# Patient Record
Sex: Male | Born: 1980 | Race: White | Hispanic: No | Marital: Single | Smoking: Never smoker
Health system: Southern US, Community
[De-identification: ages and names within clinical notes are randomized; demographics above are authoritative.]

## PROBLEM LIST (undated history)

## (undated) DIAGNOSIS — F32A Depression, unspecified: Secondary | ICD-10-CM

## (undated) DIAGNOSIS — F419 Anxiety disorder, unspecified: Secondary | ICD-10-CM

## (undated) DIAGNOSIS — F1590 Other stimulant use, unspecified, uncomplicated: Secondary | ICD-10-CM

## (undated) DIAGNOSIS — B20 Human immunodeficiency virus [HIV] disease: Secondary | ICD-10-CM

## (undated) DIAGNOSIS — F329 Major depressive disorder, single episode, unspecified: Secondary | ICD-10-CM

## (undated) DIAGNOSIS — F1910 Other psychoactive substance abuse, uncomplicated: Secondary | ICD-10-CM

## (undated) HISTORY — DX: Other stimulant use, unspecified, uncomplicated: F15.90

## (undated) HISTORY — DX: Major depressive disorder, single episode, unspecified: F32.9

## (undated) HISTORY — DX: Anxiety disorder, unspecified: F41.9

## (undated) HISTORY — PX: WISDOM TOOTH EXTRACTION: SHX21

## (undated) HISTORY — DX: Human immunodeficiency virus (HIV) disease: B20

## (undated) HISTORY — DX: Other psychoactive substance abuse, uncomplicated: F19.10

## (undated) HISTORY — DX: Depression, unspecified: F32.A

---

## 2013-06-19 ENCOUNTER — Ambulatory Visit: Payer: PRIVATE HEALTH INSURANCE

## 2013-06-19 ENCOUNTER — Ambulatory Visit (INDEPENDENT_AMBULATORY_CARE_PROVIDER_SITE_OTHER): Payer: PRIVATE HEALTH INSURANCE | Admitting: Family Medicine

## 2013-06-19 VITALS — BP 118/78 | HR 117 | Temp 98.8°F | Resp 16 | Ht 69.0 in | Wt 156.2 lb

## 2013-06-19 DIAGNOSIS — R197 Diarrhea, unspecified: Secondary | ICD-10-CM

## 2013-06-19 DIAGNOSIS — R109 Unspecified abdominal pain: Secondary | ICD-10-CM

## 2013-06-19 DIAGNOSIS — Z113 Encounter for screening for infections with a predominantly sexual mode of transmission: Secondary | ICD-10-CM

## 2013-06-19 DIAGNOSIS — E860 Dehydration: Secondary | ICD-10-CM

## 2013-06-19 LAB — POCT CBC
HCT, POC: 47.7 % (ref 43.5–53.7)
Hemoglobin: 14.9 g/dL (ref 14.1–18.1)
Lymph, poc: 4.8 — AB (ref 0.6–3.4)
MCH, POC: 28.7 pg (ref 27–31.2)
MCHC: 31.2 g/dL — AB (ref 31.8–35.4)
MCV: 91.7 fL (ref 80–97)
MPV: 7.3 fL (ref 0–99.8)
POC LYMPH PERCENT: 48.5 %L (ref 10–50)
RBC: 5.2 M/uL (ref 4.69–6.13)
RDW, POC: 14.1 %

## 2013-06-19 LAB — POCT UA - MICROSCOPIC ONLY
Casts, Ur, LPF, POC: NEGATIVE
Crystals, Ur, HPF, POC: NEGATIVE
Mucus, UA: POSITIVE
WBC, Ur, HPF, POC: NEGATIVE

## 2013-06-19 LAB — POCT URINALYSIS DIPSTICK
Blood, UA: NEGATIVE
Protein, UA: 30
Spec Grav, UA: 1.025
Urobilinogen, UA: 0.2

## 2013-06-19 NOTE — Progress Notes (Signed)
Subjective:   This chart was scribed for Jimmy Sorenson, MD at [facility] by Yevette Freehling, Scribe. This pt's care was started at 4:36 PM.   Patient ID: Jimmy Davis, male    DOB: 11/30/1980, 32 y.o.   MRN: [id number]  Chief Complaint  Patient presents with  . Diarrhea    x 2-3 months; notices more so after eating ; has had change in his diet   . Abdominal Pain    occasional   . Nausea    occasional   . Bleeding Gums    Has noticed since diarrhea started     HPI   Jimmy Davis is a 32 y.o. male, with a h/o anxiety, who presents to [facility] complaining of three months of persistent diarrhea. The pt does not recall his last normal BM; he states that he is having 4-5 episodes of diarrhea a day. He characterizes the diarrhea as "watery" and looks "like a cloud" in the toilet water. He denies any hematochezia, black or tarry stools, rice looking specks, or mucous associated with the diarrhea. He denies a h/o any prior GI problems. As associated symptoms, the pt has also experienced intermittent abdominal pain but unable to describe.  The pt states that he has been eating high-protein, low-carbohydrate diet for about 8 months now. He has been on several diet pills this year as he has been working very hard at weight loss.  For the past 2 mos, Jimmy Davis has been taking Dexyphen 1 tab qam only. Prior to Dexyphen, he utilized a different diet pill, but he was unable to recall if the diarrhea predated the his dexyphen use or not. Within the past 8 mos, the pt has intentionally lost 60 pounds, with a change from 212 pounds to 156 pounds;  In the past three months, he has lost twenty-five pounds.  This is well documented on a phone app. He has been very rigorous w/ low-carb diet and exercise. No other otc meds or supplements.  All pt has eaten or drank today is a protein shake (it is 4 pm).  He denies fever, chills, hematochezia, emesis, palpitations, or dysuria.  He denies any recent  travels or atypical foods.   He reports mild anxiety when visiting the doctor's - often gets diaphoretic and tachycardic in the office due to white coat anxiety.  He is a non-smoker.   As long as he is here, he would also like to get STD testing today.  Past Medical History  Diagnosis Date  . Anxiety   . Depression   . Substance abuse    No current outpatient prescriptions on file prior to visit.   No current facility-administered medications on file prior to visit.   No Known Allergies   Review of Systems  Constitutional: Negative for fever and chills.  Cardiovascular: Negative for palpitations.  Gastrointestinal: Positive for abdominal pain and diarrhea. Negative for vomiting and blood in stool.  Genitourinary: Negative for dysuria, urgency, frequency, decreased urine volume and difficulty urinating.  Skin: Negative for rash.  Neurological: Negative for dizziness, syncope, weakness and light-headedness.  Psychiatric/Behavioral: Negative for dysphoric mood. The patient is nervous/anxious.       BP 118/78  Pulse 117  Temp(Src) 98.8 F (37.1 C) (Oral)  Resp 16  Ht 5\' 9"  (1.753 m)  Wt 156 lb 3.2 oz (70.852 kg)  BMI 23.06 kg/m2  SpO2 100% Pulse 92 on recheck Objective:   Physical Exam  Nursing note and vitals reviewed. Constitutional:  He is oriented to person, place, and time. He appears well-developed and well-nourished. No distress.  HENT:  Head: Normocephalic and atraumatic.  Eyes: EOM are normal.  Neck: Neck supple. No tracheal deviation present.  Cardiovascular: Regular rhythm, S1 normal, S2 normal and normal heart sounds.  Tachycardia present.   No murmur heard. Pulmonary/Chest: Effort normal. No respiratory distress.  Abdominal: Soft. Normal appearance. He exhibits no mass. Bowel sounds are increased. There is no hepatosplenomegaly. There is generalized tenderness. There is no rigidity, no rebound, no guarding, no CVA tenderness, no tenderness at McBurney's  point and negative Murphy's sign. No hernia.  Generalized tenderness, but worse in upper quadrants.   Musculoskeletal: Normal range of motion.  Neurological: He is alert and oriented to person, place, and time.  Skin: Skin is warm. He is diaphoretic (Mild).  Psychiatric: He has a normal mood and affect. His behavior is normal.   Results for orders placed in visit on 06/19/13  STOOL CULTURE      Result Value Range   Preliminary Report No Suspicious Colonies, Continuing to Hold    CLOSTRIDIUM DIFFICILE BY PCR      Result Value Range   C difficile by pcr Not Detected  Not Detected  GC/CHLAMYDIA PROBE AMP      Result Value Range   CT Probe RNA NEGATIVE     GC Probe RNA NEGATIVE    COMPREHENSIVE METABOLIC PANEL      Result Value Range   Sodium 136  135 - 145 mEq/L   Potassium 4.5  3.5 - 5.3 mEq/L   Chloride 98  96 - 112 mEq/L   CO2 29  19 - 32 mEq/L   Glucose, Bld 88  70 - 99 mg/dL   BUN 17  6 - 23 mg/dL   Creat 1.61  0.96 - 0.45 mg/dL   Total Bilirubin 0.5  0.3 - 1.2 mg/dL   Alkaline Phosphatase 98  39 - 117 U/L   AST 21  0 - 37 U/L   ALT 29  0 - 53 U/L   Total Protein 8.6 (*) 6.0 - 8.3 g/dL   Albumin 4.4  3.5 - 5.2 g/dL   Calcium 9.6  8.4 - 40.9 mg/dL  TSH      Result Value Range   TSH 3.087  0.350 - 4.500 uIU/mL  HIV ANTIBODY (ROUTINE TESTING)      Result Value Range   HIV REACTIVE  NON REACTIVE  HEPATITIS C ANTIBODY      Result Value Range   HCV Ab NEGATIVE  NEGATIVE  HSV(HERPES SIMPLEX VRS) I + II AB-IGG      Result Value Range   HSV 1 Glycoprotein G Ab, IgG 3.10 (*)    HSV 2 Glycoprotein G Ab, IgG <0.10    RPR      Result Value Range   RPR NON REAC  NON REAC  FECAL LACTOFERRIN      Result Value Range   Lactoferrin POSITIVE    HIV 1/2 CONFIRMATION, WESTERN BLOT      Result Value Range   HIV-1 Western Blot       HIV-2 Ab       HIV 1 RNA, QL TMA      POCT CBC      Result Value Range   WBC 10.0  4.6 - 10.2 K/uL   Lymph, poc 4.8 (*) 0.6 - 3.4   POC LYMPH  PERCENT 48.5  10 - 50 %L   MID (cbc) 0.5  0 - 0.9   POC MID % 5.5  0 - 12 %M   POC Granulocyte 4.6  2 - 6.9   Granulocyte percent 46.0  37 - 80 %G   RBC 5.20  4.69 - 6.13 M/uL   Hemoglobin 14.9  14.1 - 18.1 g/dL   HCT, POC 16.1  09.6 - 53.7 %   MCV 91.7  80 - 97 fL   MCH, POC 28.7  27 - 31.2 pg   MCHC 31.2 (*) 31.8 - 35.4 g/dL   RDW, POC 04.5     Platelet Count, POC 319  142 - 424 K/uL   MPV 7.3  0 - 99.8 fL  IFOBT (OCCULT BLOOD)      Result Value Range   IFOBT Positive    POCT SEDIMENTATION RATE      Result Value Range   POCT SED RATE 59 (*) 0 - 22 mm/hr  POCT UA - MICROSCOPIC ONLY      Result Value Range   WBC, Ur, HPF, POC neg     RBC, urine, microscopic neg     Bacteria, U Microscopic trace     Mucus, UA pos     Epithelial cells, urine per micros 0-1     Crystals, Ur, HPF, POC neg     Casts, Ur, LPF, POC neg     Yeast, UA neg    POCT URINALYSIS DIPSTICK      Result Value Range   Color, UA yellow     Clarity, UA clear     Glucose, UA neg     Bilirubin, UA neg     Ketones, UA 15     Spec Grav, UA 1.025     Blood, UA neg     pH, UA 6.0     Protein, UA 30     Urobilinogen, UA 0.2     Nitrite, UA neg     Leukocytes, UA Negative       EKG read by Dr. Norberto Davis. Normal sinus rhythm. No ischemic changes.  [facility] reading (PRIMARY) by  Dr. Clelia Croft.  Acute abd series: Normal chest x-ray. Normal heart size. Normal stomach level. Few air-fluid levels seen throughout colon. Otherwise unremarkable bowel-gas pattern. No acute abnormality.  Assessment & Plan:  Diarrhea - Plan: EKG 12-Lead, DG Abd Acute W/Chest, POCT CBC, IFOBT POC (occult bld, rslt in office), POCT SEDIMENTATION RATE, POCT UA - Microscopic Only, POCT urinalysis dipstick, Stool culture, Comprehensive metabolic panel, TSH,  , Fecal Lactoferrin, Clostridium Difficile by PCR,- Unknown etiology but w/u pending.  Encouraged pt to abstain from using Dexyphen until symptoms resolve as there is a possibility that this diet pill  is the cause of his diarrhea and it may resolve upon its cessation. If his sxs cont after stopping the diet pill and if labs do not point to any particular etiology, will recommend GI referral.  Abdominal  pain, other specified site - Plan: EKG 12-Lead, DG Abd Acute W/Chest, POCT CBC, IFOBT POC (occult bld, rslt in office), POCT SEDIMENTATION RATE, POCT UA - Microscopic Only, POCT urinalysis dipstick, Stool culture, Comprehensive metabolic panel, TSH, HIV antibody, Hepatitis C antibody, HSV(herpes simplex vrs) 1+2 ab-IgG, RPR, Fecal Lactoferrin, Clostridium Difficile by PCR,   Screening for STD (sexually transmitted disease) - Plan:  HIV antibody, Hepatitis C antibody, HSV(herpes simplex vrs) 1+2 ab-IgG, RPR, GC/Chlamydia Probe Amp, Dehydration  Meds ordered this encounter  Medications  . FLUoxetine (PROZAC) 40 MG capsule    Sig: Take 40 mg  by mouth daily.  . busPIRone (BUSPAR) 30 MG tablet    Sig: Take 30 mg by mouth 2 (two) times daily.    I personally performed the services described in this documentation, which was scribed in my presence. The recorded information has been reviewed and considered, and addended by me as needed.  Jimmy Sorenson, MD MPH

## 2013-06-19 NOTE — Patient Instructions (Addendum)
I recommend that you stop the weight loss medication temporarily while we are figuring this out - many of these medications work by speeding up your bowels which could be making your diarrhea worse.  If all of your blood tests come back normal and your symptoms do not resolve with the cessation of the weight loss medication, I recommend that we refer you to a GI doctor.  Diarrhea Diarrhea is frequent loose and watery bowel movements. It can cause you to feel weak and dehydrated. Dehydration can cause you to become tired and thirsty, have a dry mouth, and have decreased urination that often is dark yellow. Diarrhea is a sign of another problem, most often an infection that will not last long. In most cases, diarrhea typically lasts 2 3 days. However, it can last longer if it is a sign of something more serious. It is important to treat your diarrhea as directed by your caregive to lessen or prevent future episodes of diarrhea. CAUSES  Some common causes include:  Gastrointestinal infections caused by viruses, bacteria, or parasites.  Food poisoning or food allergies.  Certain medicines, such as antibiotics, chemotherapy, and laxatives.  Artificial sweeteners and fructose.  Digestive disorders. HOME CARE INSTRUCTIONS  Ensure adequate fluid intake (hydration): have 1 cup (8 oz) of fluid for each diarrhea episode. Avoid fluids that contain simple sugars or sports drinks, fruit juices, whole milk products, and sodas. Your urine should be clear or pale yellow if you are drinking enough fluids. Hydrate with an oral rehydration solution that you can purchase at pharmacies, retail stores, and online. You can prepare an oral rehydration solution at home by mixing the following ingredients together:    tsp table salt.   tsp baking soda.   tsp salt substitute containing potassium chloride.  1  tablespoons sugar.  1 L (34 oz) of water.  Certain foods and beverages may increase the speed at which  food moves through the gastrointestinal (GI) tract. These foods and beverages should be avoided and include:  Caffeinated and alcoholic beverages.  High-fiber foods, such as raw fruits and vegetables, nuts, seeds, and whole grain breads and cereals.  Foods and beverages sweetened with sugar alcohols, such as xylitol, sorbitol, and mannitol.  Some foods may be well tolerated and may help thicken stool including:  Starchy foods, such as rice, toast, pasta, low-sugar cereal, oatmeal, grits, baked potatoes, crackers, and bagels.  Bananas.  Applesauce.  Add probiotic-rich foods to help increase healthy bacteria in the GI tract, such as yogurt and fermented milk products.  Wash your hands well after each diarrhea episode.  Only take over-the-counter or prescription medicines as directed by your caregiver.  Take a warm bath to relieve any burning or pain from frequent diarrhea episodes. SEEK IMMEDIATE MEDICAL CARE IF:   You are unable to keep fluids down.  You have persistent vomiting.  You have blood in your stool, or your stools are black and tarry.  You do not urinate in 6 8 hours, or there is only a small amount of very dark urine.  You have abdominal pain that increases or localizes.  You have weakness, dizziness, confusion, or lightheadedness.  You have a severe headache.  Your diarrhea gets worse or does not get better.  You have a fever or persistent symptoms for more than 2 3 days.  You have a fever and your symptoms suddenly get worse. MAKE SURE YOU:   Understand these instructions.  Will watch your condition.  Will get help  right away if you are not doing well or get worse. Document Released: 06/14/2002 Document Revised: 06/10/2012 Document Reviewed: 03/01/2012 [facility] Patient Information 2014 [place], [place].

## 2013-06-20 LAB — COMPREHENSIVE METABOLIC PANEL
ALT: 29 U/L (ref 0–53)
Albumin: 4.4 g/dL (ref 3.5–5.2)
BUN: 17 mg/dL (ref 6–23)
CO2: 29 mEq/L (ref 19–32)
Calcium: 9.6 mg/dL (ref 8.4–10.5)
Chloride: 98 mEq/L (ref 96–112)
Creat: 0.92 mg/dL (ref 0.50–1.35)
Glucose, Bld: 88 mg/dL (ref 70–99)
Potassium: 4.5 mEq/L (ref 3.5–5.3)
Total Bilirubin: 0.5 mg/dL (ref 0.3–1.2)

## 2013-06-20 LAB — HEPATITIS C ANTIBODY: HCV Ab: NEGATIVE

## 2013-06-20 LAB — RPR

## 2013-06-20 LAB — HIV ANTIBODY (ROUTINE TESTING W REFLEX): HIV: REACTIVE

## 2013-06-20 LAB — TSH: TSH: 3.087 u[IU]/mL (ref 0.350–4.500)

## 2013-06-21 LAB — HSV(HERPES SIMPLEX VRS) I + II AB-IGG
HSV 1 Glycoprotein G Ab, IgG: 3.1 IV — ABNORMAL HIGH
HSV 2 Glycoprotein G Ab, IgG: <0.1 IV

## 2013-06-21 LAB — CLOSTRIDIUM DIFFICILE BY PCR: Toxigenic C. Difficile by PCR: NOT DETECTED

## 2013-06-21 LAB — GC/CHLAMYDIA PROBE AMP: CT Probe RNA: NEGATIVE

## 2013-06-22 ENCOUNTER — Encounter: Payer: Self-pay | Admitting: Family Medicine

## 2013-06-22 LAB — FECAL LACTOFERRIN, QUANT: Lactoferrin: POSITIVE

## 2013-06-23 ENCOUNTER — Encounter: Payer: Self-pay | Admitting: Internal Medicine

## 2013-06-23 ENCOUNTER — Encounter: Payer: Self-pay | Admitting: Family Medicine

## 2013-06-23 LAB — HIV 1/2 CONFIRMATION: HIV-1 antibody: POSITIVE — AB

## 2013-06-23 NOTE — Telephone Encounter (Signed)
Please advise, Dr Loreta Ave may have sooner availability

## 2013-06-24 ENCOUNTER — Ambulatory Visit (INDEPENDENT_AMBULATORY_CARE_PROVIDER_SITE_OTHER): Payer: PRIVATE HEALTH INSURANCE | Admitting: Family Medicine

## 2013-06-24 VITALS — BP 128/60 | HR 80 | Temp 98.4°F | Resp 16 | Ht 69.0 in | Wt 153.0 lb

## 2013-06-24 DIAGNOSIS — B20 Human immunodeficiency virus [HIV] disease: Secondary | ICD-10-CM

## 2013-06-24 DIAGNOSIS — R197 Diarrhea, unspecified: Secondary | ICD-10-CM

## 2013-06-24 DIAGNOSIS — F411 Generalized anxiety disorder: Secondary | ICD-10-CM

## 2013-06-24 LAB — STOOL CULTURE

## 2013-06-24 NOTE — Patient Instructions (Addendum)
Start a fiber supplement like metamucil or benefiber (which is clear and tasteless).  There are also fiber gummies which are delicious (but highly effective so watch out!)  If you are having any increase in anxiety or trouble sleeping let me know. Don't hesitate to email through MyChart if you have any questions in the meantime.  Diarrhea Diarrhea is frequent loose and watery bowel movements. It can cause you to feel weak and dehydrated. Dehydration can cause you to become tired and thirsty, have a dry mouth, and have decreased urination that often is dark yellow. Diarrhea is a sign of another problem, most often an infection that will not last long. In most cases, diarrhea typically lasts 2 3 days. However, it can last longer if it is a sign of something more serious. It is important to treat your diarrhea as directed by your caregive to lessen or prevent future episodes of diarrhea. CAUSES  Some common causes include:  Gastrointestinal infections caused by viruses, bacteria, or parasites.  Food poisoning or food allergies.  Certain medicines, such as antibiotics, chemotherapy, and laxatives.  Artificial sweeteners and fructose.  Digestive disorders. HOME CARE INSTRUCTIONS  Ensure adequate fluid intake (hydration): have 1 cup (8 oz) of fluid for each diarrhea episode. Avoid fluids that contain simple sugars or sports drinks, fruit juices, whole milk products, and sodas. Your urine should be clear or pale yellow if you are drinking enough fluids. Hydrate with an oral rehydration solution that you can purchase at pharmacies, retail stores, and online. You can prepare an oral rehydration solution at home by mixing the following ingredients together:    tsp table salt.   tsp baking soda.   tsp salt substitute containing potassium chloride.  1  tablespoons sugar.  1 L (34 oz) of water.  Certain foods and beverages may increase the speed at which food moves through the gastrointestinal (GI)  tract. These foods and beverages should be avoided and include:  Caffeinated and alcoholic beverages.  High-fiber foods, such as raw fruits and vegetables, nuts, seeds, and whole grain breads and cereals.  Foods and beverages sweetened with sugar alcohols, such as xylitol, sorbitol, and mannitol.  Some foods may be well tolerated and may help thicken stool including:  Starchy foods, such as rice, toast, pasta, low-sugar cereal, oatmeal, grits, baked potatoes, crackers, and bagels.  Bananas.  Applesauce.  Add probiotic-rich foods to help increase healthy bacteria in the GI tract, such as yogurt and fermented milk products.  Wash your hands well after each diarrhea episode.  Only take over-the-counter or prescription medicines as directed by your caregiver.  Take a warm bath to relieve any burning or pain from frequent diarrhea episodes. SEEK IMMEDIATE MEDICAL CARE IF:   You are unable to keep fluids down.  You have persistent vomiting.  You have blood in your stool, or your stools are black and tarry.  You do not urinate in 6 8 hours, or there is only a small amount of very dark urine.  You have abdominal pain that increases or localizes.  You have weakness, dizziness, confusion, or lightheadedness.  You have a severe headache.  Your diarrhea gets worse or does not get better.  You have a fever or persistent symptoms for more than 2 3 days.  You have a fever and your symptoms suddenly get worse. MAKE SURE YOU:   Understand these instructions.  Will watch your condition.  Will get help right away if you are not doing well or get worse.  Document Released: 06/14/2002 Document Revised: 06/10/2012 Document Reviewed: 03/01/2012 [facility] Patient Information 2014 [place], [place]. HIV Infection and AIDS HIV stands for human immunodeficiency virus. HIV is the virus that causes the disease known as AIDS (acquired immunodeficiency syndrome). HIV is a viral infection that  attacks the T-cell lymphocytes of the human immune system. If left untreated, HIV will kill enough T-cells so that the body cannot fight off infection. Patients who have AIDS, suffer from "opportunistic infections." Opportunistic infections take advantage of the patient's weak immune system, and cause illness. RISK FACTORS   Direct contact with blood or other body fluids.  Unprotected sexual intercourse.  Sharing of contaminated needles.  Blood transfusions.  Infants whose mothers were infected, during pregnancy or through breast milk. SYMPTOMS   Sometimes, no symptoms.  Flu-like symptoms.  Repeated severe yeast infections in mouth or vagina, despite treatment.  Swollen lymph nodes.  Muscle pain.  Joint pain.  Persistent diarrhea.  Loss of appetite.  Weight loss.  Frequent opportunistic diseases:  Kaposi's sarcoma.  Pneumocystis carinii pneumonia (PCP)  Tuberculosis.  Meningitis.  Herpes simplex infections.  Blurry vision.  Loss of vision. PREVENTION   Know the sexual history of any new sexual partner.  Use safe sex practices, with barrier protection.  Avoid having multiple sexual partners.  Avoid direct contact with blood or other body fluids, by using gloves, goggles, and masks when you might encounter them.  Do not share needles. TREATMENT  HIV and AIDS have no known cure. However, with early diagnosis and proper treatment, one can live a relatively healthy and long life. Treatment is directed at decreasing the level of virus in the body (viral load). To decrease the viral load, patients are given antiviral medicines. Patients are also given preventive care for many opportunistic diseases, such as pneumonia, tuberculosis, toxoplasmosis, tetanus, hepatitis B, pneumococcal infections, and influenza. Opportunistic infections are also treated as they develop.  Document Released: 06/24/2005 Document Revised: 09/16/2011 Document Reviewed: 10/06/2008 [facility]  Patient Information 2014 [place], [place].

## 2013-06-25 ENCOUNTER — Encounter: Payer: Self-pay | Admitting: Family Medicine

## 2013-06-25 LAB — T-HELPER CELLS (CD4) COUNT (NOT AT ARMC)
Absolute CD4: 438 /uL (ref 381–1469)
CD4 T Helper %: 11 % — ABNORMAL LOW (ref 32–62)
Total Lymphocyte: 48 % — ABNORMAL HIGH (ref 12–46)
Total lymphocyte count: 3984 /uL — ABNORMAL HIGH (ref 700–3300)
WBC, lymph enumeration: 8.3 10*3/uL (ref 4.0–10.5)

## 2013-06-25 LAB — HIV-1 RNA ULTRAQUANT REFLEX TO GENTYP+: HIV 1 RNA Quant: 104987 copies/mL — ABNORMAL HIGH (ref <20)

## 2013-06-29 ENCOUNTER — Encounter: Payer: Self-pay | Admitting: Internal Medicine

## 2013-06-29 ENCOUNTER — Encounter: Payer: Self-pay | Admitting: *Deleted

## 2013-06-29 ENCOUNTER — Ambulatory Visit (INDEPENDENT_AMBULATORY_CARE_PROVIDER_SITE_OTHER): Payer: PRIVATE HEALTH INSURANCE | Admitting: Internal Medicine

## 2013-06-29 ENCOUNTER — Ambulatory Visit: Payer: Self-pay

## 2013-06-29 VITALS — BP 124/75 | HR 96 | Temp 97.6°F | Wt 161.0 lb

## 2013-06-29 DIAGNOSIS — K529 Noninfective gastroenteritis and colitis, unspecified: Secondary | ICD-10-CM

## 2013-06-29 DIAGNOSIS — R197 Diarrhea, unspecified: Secondary | ICD-10-CM

## 2013-06-29 DIAGNOSIS — B20 Human immunodeficiency virus [HIV] disease: Secondary | ICD-10-CM | POA: Insufficient documentation

## 2013-06-29 DIAGNOSIS — Z23 Encounter for immunization: Secondary | ICD-10-CM

## 2013-06-29 LAB — LIPID PANEL
Cholesterol: 149 mg/dL (ref 0–200)
HDL: 33 mg/dL — ABNORMAL LOW (ref >39)
LDL Cholesterol: 94 mg/dL (ref 0–99)
Total CHOL/HDL Ratio: 4.5 Ratio
Triglycerides: 110 mg/dL (ref <150)
VLDL: 22 mg/dL (ref 0–40)

## 2013-06-29 NOTE — Progress Notes (Signed)
Subjective:    Patient ID: Jimmy Davis, male    DOB: 10/09/1980, 32 y.o.   MRN: [id number]  HPI 32 yo M with past med history of anxiety, who has recent HIV diagnosis , CD 4 count of 430/VL 104,987 (dec 2014), geno pending.   Previously tested 3 yr ago. Was abstinent for 2 years until this year and had Unprotected sex with Men. Has remote illicit drug use but recently relapsed meth, mostly smoking but also IVDU. No illicit drug use in the last 2 weekends, looking to stop using.  Stopped taking diet pill 10 days, where he lost 60# in 8 months.last seen by pcp this month also in setting of watery diarrhea.  No Known Allergies  Current Outpatient Prescriptions on File Prior to Visit  Medication Sig Dispense Refill  . busPIRone (BUSPAR) 30 MG tablet Take 30 mg by mouth 2 (two) times daily.      Marland Kitchen FLUoxetine (PROZAC) 40 MG capsule Take 40 mg by mouth daily.       No current facility-administered medications on file prior to visit.   Gets psych meds through [facility]. Active Ambulatory Problems    Diagnosis Date Noted  . No Active Ambulatory Problems   Resolved Ambulatory Problems    Diagnosis Date Noted  . No Resolved Ambulatory Problems   Past Medical History  Diagnosis Date  . Anxiety   . Depression   . Substance abuse   - wisdom tooth removal - oct 2012[facility]2 IVDU - 32 yo evaluated for suicide ideation History   Social History  . Marital Status: Single    Spouse Name: N/A    Number of Children: N/A  . Years of Education: N/A   Occupational History  . Not on file.   Social History Main Topics  . Smoking status: Never Smoker   . Smokeless tobacco: Not on file  . Alcohol Use: Yes  . Drug Use: No  . Sexual Activity: Not on file   Other Topics Concern  . Not on file   Social History Narrative  . No narrative on file   - works full-time as Airline pilot, receives health/dental coverage through work - no  smoking, social drinking - not in a relationship.    family history includes Heart disease in his father; Mental illness in his maternal grandmother.  Review of Systems 2 watery diarrhea - now having only 2 bowel movements per day Residing gum  nightsweats Otherwise 12 point ROS is negative     Objective:   Physical Exam BP 124/75  Pulse 96  Temp(Src) 97.6 F (36.4 C) (Oral)  Wt 161 lb (73.029 kg) Physical Exam  Constitutional: He is oriented to person, place, and time. He appears well-developed and well-nourished. No distress.  HENT:  Mouth/Throat: Oropharynx is clear and moist. No oropharyngeal exudate.  Cardiovascular: Normal rate, regular rhythm and normal heart sounds. Exam reveals no gallop and no friction rub.  No murmur heard.  Pulmonary/Chest: Effort normal and breath sounds normal. No respiratory distress. He has no wheezes.  Abdominal: Soft. Bowel sounds are normal. He exhibits no distension. There is no tenderness.  Lymphadenopathy:  He has no cervical adenopathy.  Neurological: He is alert and oriented to person, place, and time.  Skin: Skin is warm and dry. No rash noted. No erythema.  Psychiatric: He has a normal mood and affect. His behavior is normal.      Assessment & Plan:  hiv = will  do testing for hepatitis, lipid profile.  Discussed adherence with him to start stribild (if geno is naive). Gave him co-pay card to go to [facility] at [address].   Chronic diarrhea = will check giardia, and cryptosporidium.

## 2013-06-30 LAB — HEPATITIS B CORE ANTIBODY, IGM: Hep B C IgM: NONREACTIVE

## 2013-07-02 ENCOUNTER — Encounter: Payer: Self-pay | Admitting: Internal Medicine

## 2013-07-06 ENCOUNTER — Encounter: Payer: Self-pay | Admitting: Family Medicine

## 2013-07-07 MED ORDER — CLONAZEPAM 0.5 MG PO TABS: 0.5000 mg | ORAL_TABLET | Freq: Two times a day (BID) | ORAL | Status: DC | PRN

## 2013-07-09 ENCOUNTER — Other Ambulatory Visit: Payer: Self-pay | Admitting: Internal Medicine

## 2013-07-09 LAB — HIV-1 GENOTYPR PLUS

## 2013-07-12 LAB — GIARDIA/CRYPTOSPORIDIUM (EIA)
Cryptosporidium Screen (EIA): NEGATIVE
Giardia Screen (EIA): NEGATIVE

## 2013-07-13 ENCOUNTER — Encounter: Payer: Self-pay | Admitting: Internal Medicine

## 2013-07-13 NOTE — Telephone Encounter (Signed)
Patient requesting lab results of 1) stool cultures and 2) genotype to see what mediation he should be prescribed.  See his message below.  Please advise. [name], Michelle M, RN   ____________________ Hello Dr. Drue SecondSnider,  I was just checking on the status of my lab results and if I was going to be put on medicine soon. You told me to follow up with you in a couple weeks. I hope you are doing well.  Thanks, Jimmy SoxJonathan Brimage

## 2013-07-17 ENCOUNTER — Encounter: Payer: Self-pay | Admitting: Internal Medicine

## 2013-07-19 ENCOUNTER — Ambulatory Visit (INDEPENDENT_AMBULATORY_CARE_PROVIDER_SITE_OTHER): Payer: PRIVATE HEALTH INSURANCE | Admitting: Family Medicine

## 2013-07-19 ENCOUNTER — Ambulatory Visit: Payer: PRIVATE HEALTH INSURANCE

## 2013-07-19 ENCOUNTER — Other Ambulatory Visit: Payer: Self-pay | Admitting: Internal Medicine

## 2013-07-19 VITALS — BP 90/50 | HR 89 | Temp 99.8°F | Resp 16 | Ht 68.0 in | Wt 155.0 lb

## 2013-07-19 DIAGNOSIS — B20 Human immunodeficiency virus [HIV] disease: Secondary | ICD-10-CM

## 2013-07-19 DIAGNOSIS — R059 Cough, unspecified: Secondary | ICD-10-CM

## 2013-07-19 DIAGNOSIS — J09X2 Influenza due to identified novel influenza A virus with other respiratory manifestations: Secondary | ICD-10-CM

## 2013-07-19 DIAGNOSIS — R05 Cough: Secondary | ICD-10-CM

## 2013-07-19 DIAGNOSIS — Z21 Asymptomatic human immunodeficiency virus [HIV] infection status: Secondary | ICD-10-CM

## 2013-07-19 MED ORDER — ELVITEG-COBIC-EMTRICIT-TENOFDF 150-150-200-300 MG PO TABS: 1.0000 | ORAL_TABLET | Freq: Every day | ORAL | Status: DC

## 2013-07-19 MED ORDER — BENZONATATE 100 MG PO CAPS: 100.0000 mg | ORAL_CAPSULE | Freq: Three times a day (TID) | ORAL | Status: DC | PRN

## 2013-07-19 MED ORDER — HYDROCODONE-HOMATROPINE 5-1.5 MG/5ML PO SYRP: 5.0000 mL | ORAL_SOLUTION | ORAL | Status: DC | PRN

## 2013-07-19 NOTE — Telephone Encounter (Signed)
Pt would like to start medications. Genotype shows not resistance. Is it ok to send the Stribild to the pharmacy?   Laurell Josephsammy K King, RN

## 2013-07-19 NOTE — Progress Notes (Signed)
Subjective: 33 year old man who presents with a four-day history of a cough. He initially had some fever like symptoms but didn't take his temperature. He had diarrhea all that night. He has not had ongoing diarrhea, but has a persistent hacking cough. It is nonproductive. He feels like a stuffy stretch to get up Medicaid. He has not been wheezing. He was diagnosed a few weeks ago with HIV, and solve the infectious disease specialist on the 23rd. He was started on his HIV medications today. No other major symptoms. He did get his flu shot. He missed work on Friday and left after half a day of work today. He did feel a little bit dizzy, but he has now been drinking plenty of liquids and is urinating regularly.  Objective: Pleasant young man in no obvious distress. When he takes deep breath he does cough. His TMs are normal. Throat clear. Neck supple without nodes. Chest clear to auscultation. Heart regular without murmurs.  Assessment: Cough, possibly influenza post flu shot HIV, with T4 cell count of 438  Plan: Chest x-ray  [facility] reading (PRIMARY) by  Dr. Alwyn RenHopper Normal chest x-ray.  Probably influenza, a muted version due to having had a shot  With a stable T4 count we will treat like an hour cough and run its course. Cautioned the patient to return if in all worse

## 2013-07-19 NOTE — Patient Instructions (Signed)
Use the cough syrup as necessary for bad coughing  Take the Tessalon pills when needed for cough when not able to be sedated. However these may not be available at the pharmacy, in which event you'll just have to use cough syrups  Continue drinking plenty of fluids and get enough rest  Advise strongly against use of substances especially while sick (but anytime also)  Return if more shortness of breath, worse cough, rising fevers, or just getting worse in other symptoms.

## 2013-07-22 ENCOUNTER — Encounter: Payer: Self-pay | Admitting: Family Medicine

## 2013-07-27 ENCOUNTER — Ambulatory Visit (INDEPENDENT_AMBULATORY_CARE_PROVIDER_SITE_OTHER): Payer: PRIVATE HEALTH INSURANCE | Admitting: Internal Medicine

## 2013-07-27 ENCOUNTER — Encounter: Payer: Self-pay | Admitting: Internal Medicine

## 2013-07-27 VITALS — BP 120/70 | HR 60 | Ht 69.0 in | Wt 156.2 lb

## 2013-07-27 DIAGNOSIS — K529 Noninfective gastroenteritis and colitis, unspecified: Secondary | ICD-10-CM

## 2013-07-27 DIAGNOSIS — R197 Diarrhea, unspecified: Secondary | ICD-10-CM

## 2013-07-27 MED ORDER — LOPERAMIDE HCL 2 MG PO TABS: 4.0000 mg | ORAL_TABLET | Freq: Three times a day (TID) | ORAL | Status: DC | PRN

## 2013-07-27 NOTE — Assessment & Plan Note (Signed)
Several month history in the setting of HIV disease, recent initiation of HAART therapy. I suspect he has something underlying like MAI, and that should resolve once his HIV therapy kicks in and his lymphocyte count are elevated and his viral load is down. I discussed this with Dr. Drue SecondSnider of infectious disease and she agrees. Will hold on colonoscopy which would otherwise be the next step at this time.

## 2013-07-27 NOTE — Patient Instructions (Signed)
Use Imodium as needed.   Continue to see Dr. Drue SecondSnider and if she feels you need a colonoscopy we will see you back , otherwise follow up with us as needed.   I appreciate the opportunity to care for you.

## 2013-07-27 NOTE — Progress Notes (Signed)
Subjective:  Referred by: Jimmy Davis, Jimmy N, MD   Patient ID: Jimmy Davis, male    DOB: May 09, 1981, 33 y.o.   MRN: [id number]  HPI The patient is a very pleasant 33 year old man here because of a several month history of watery diarrhea. He began somewhat insidiously over the last 2-3 months. It is generally controllable I don't think he's had any incontinence. There is no bleeding but he is been found to be Hemoccult-positive. Fecal leukocytes are also positive. Stool C. difficile PCR, cultures, ova and parasite screen for been negative. He recently started therapy for his HIV disease, about one week ago. Note that he denies any change in medications, travel history or contacts associated with the onset of the diarrhea. No fevers.  Wt Readings from Last 3 Encounters:  07/27/13 156 lb 3.2 oz (70.852 kg)  07/19/13 155 lb (70.308 kg)  06/29/13 161 lb (73.029 kg)   other problems have included recent onset of some night sweats. Otherwise he feels reasonably well having recovered from an influenza-like illness. No Known Allergies Outpatient Prescriptions Prior to Visit  Medication Sig Dispense Refill  . benzonatate (TESSALON) 100 MG capsule Take 1-2 capsules (100-200 mg total) by mouth 3 (three) times daily as needed for cough.  40 capsule  0  . busPIRone (BUSPAR) 30 MG tablet Take 30 mg by mouth 2 (two) times daily.      . clonazePAM (KLONOPIN) 0.5 MG tablet Take 1 tablet (0.5 mg total) by mouth 2 (two) times daily as needed for anxiety.  20 tablet  1  . elvitegravir-cobicistat-emtricitabine-tenofovir (STRIBILD) 150-150-200-300 MG TABS tablet Take 1 tablet by mouth daily with breakfast.  30 tablet  11  . FLUoxetine (PROZAC) 40 MG capsule Take 40 mg by mouth daily.      Marland Kitchen. HYDROcodone-homatropine (HYCODAN) 5-1.5 MG/5ML syrup Take 5 mLs by mouth every 4 (four) hours as needed for cough.  120 mL  0   No facility-administered medications prior to visit.   Past Medical History  Diagnosis  Date  . Anxiety   . Depression   . Substance abuse   . Human immunodeficiency virus (HIV) disease    Past Surgical History  Procedure Laterality Date  . Wisdom tooth extraction     History   Social History  . Marital Status: Single                 Occupational History  . Accounting    Social History Main Topics  . Smoking status: Never Smoker   . Smokeless tobacco: Never Used  . Alcohol Use: Yes  . Drug Use: No       Other Topics Concern  . 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 Heart disease Father   . Mental illness Maternal Grandmother    Review of Systems As per history of present illness, and also has anxiety depression fatigue insomnia and he has been thirsty she says. All other review of systems are negative.    Objective:   Physical Exam General:  Well-developed, well-nourished and in no acute distress Eyes:  anicteric. ENT:   Mouth and posterior pharynx free of lesions.  Neck:   supple w/o thyromegaly or mass.  Lungs: Clear to auscultation bilaterally. Heart:  S1S2, no rubs, murmurs, gallops. Abdomen:  soft,  non-tender, no hepatosplenomegaly, hernia, or mass and BS+.  Lymph:  no cervical or supraclavicular adenopathy. Extremities:   no edema Skin   no rash. Multiple tattoos Neuro:  A&O x 3.  Psych:  appropriate mood and  Affect.   Data Reviewed: Lab Results  Component Value Date   WBC 10.0 06/19/2013   HGB 14.9 06/19/2013   HCT 47.7 06/19/2013   MCV 91.7 06/19/2013   Labs are flowed in the EMR related to HIV, stool studies, primary care and infectious disease Notes are reviewed.    Assessment & Plan:   1. Chronic diarrhea    I appreciate the opportunity to care for this patient. CC: Jimmy Sorenson, MD and Jimmy Munson, MD

## 2013-08-01 NOTE — Progress Notes (Signed)
   Subjective:    Patient ID: Jimmy Davis, male    DOB: Jun 02, 1981, 33 y.o.   MRN: [id number] Chief Complaint  Patient presents with  . lab results    HPI  Mr. Jimmy Davis came in to the clinic today at my request to discuss his lab results which returned showing + HIV on confirmation.  He states he is actually not surprised. When I called him to come in - he figured it must be because of his HIV test.  He states he did have a period of making some poor decisions several months ago which is when he guesses he contracted the virus.  He has an friend with HIV who had been encouraging him to get tested due to his concern.  Reports he does not feel overlly anxious or paniced about the diagnosis and that he has a good support network. However, he would like to get established w/ ID ASAP as he is very interested in starting treatment as soon as possible.  Past Medical History  Diagnosis Date  . Anxiety   . Depression   . Substance abuse   . Human immunodeficiency virus (HIV) disease    Current Outpatient Prescriptions on File Prior to Visit  Medication Sig Dispense Refill  . busPIRone (BUSPAR) 30 MG tablet Take 30 mg by mouth 2 (two) times daily.      Marland Kitchen. FLUoxetine (PROZAC) 40 MG capsule Take 40 mg by mouth daily.       No current facility-administered medications on file prior to visit.   No Known Allergies  Review of Systems  Constitutional: Positive for unexpected weight change. Negative for fever, chills, diaphoresis, activity change, appetite change and fatigue.  Respiratory: Negative for shortness of breath.   Cardiovascular: Negative for chest pain.  Gastrointestinal: Positive for abdominal pain and diarrhea. Negative for nausea, vomiting, constipation, blood in stool, abdominal distention, anal bleeding and rectal pain.  Genitourinary: Negative for dysuria, frequency and decreased urine volume.  Skin: Negative for rash.  Hematological: Negative for adenopathy.    Psychiatric/Behavioral: Positive for sleep disturbance. Negative for suicidal ideas, behavioral problems, confusion, self-injury, dysphoric mood and agitation. The patient is nervous/anxious.       BP 128/60  Pulse 80  Temp(Src) 98.4 F (36.9 C) (Oral)  Resp 16  Ht 5\' 9"  (1.753 m)  Wt 153 lb (69.4 kg)  BMI 22.58 kg/m2  SpO2 100% Objective:   Physical Exam  Constitutional: He is oriented to person, place, and time. He appears well-developed and well-nourished. No distress.  HENT:  Head: Normocephalic and atraumatic.  Eyes: No scleral icterus.  Pulmonary/Chest: Effort normal.  Neurological: He is alert and oriented to person, place, and time.  Skin: Skin is warm and dry. He is not diaphoretic.  Psychiatric: He has a normal mood and affect. His behavior is normal.      Assessment & Plan:  HIV disease - Plan: HIV-1 RNA ultraquant reflex to gentyp+, Ambulatory referral to Infectious Disease, T-helper cells (CD4) count, CANCELED: T-helper cells (CD4) count - refer to ID to further discuss treatment and management options.  Pt is handling info very well but does have a h/o insomnia and anxiety so encouraged him to call if has any problems.  Diarrhea - could be due to HIV -rec pt discuss w/ ID doctors but continue with GI referral as well.   Jimmy SorensonEva Dashaun Onstott, MD MPH

## 2013-08-13 ENCOUNTER — Encounter: Payer: Self-pay | Admitting: *Deleted

## 2013-08-17 ENCOUNTER — Encounter: Payer: Self-pay | Admitting: Internal Medicine

## 2013-08-17 ENCOUNTER — Ambulatory Visit (INDEPENDENT_AMBULATORY_CARE_PROVIDER_SITE_OTHER): Payer: PRIVATE HEALTH INSURANCE | Admitting: Internal Medicine

## 2013-08-17 VITALS — BP 122/84 | HR 111 | Temp 98.7°F | Wt 155.0 lb

## 2013-08-17 DIAGNOSIS — J069 Acute upper respiratory infection, unspecified: Secondary | ICD-10-CM

## 2013-08-17 DIAGNOSIS — F4323 Adjustment disorder with mixed anxiety and depressed mood: Secondary | ICD-10-CM

## 2013-08-17 DIAGNOSIS — B20 Human immunodeficiency virus [HIV] disease: Secondary | ICD-10-CM

## 2013-08-17 LAB — CBC WITH DIFFERENTIAL/PLATELET
BASOS PCT: 1 % (ref 0–1)
Basophils Absolute: 0.1 10*3/uL (ref 0.0–0.1)
EOS ABS: 0.1 10*3/uL (ref 0.0–0.7)
EOS PCT: 2 % (ref 0–5)
HCT: 38.2 % — ABNORMAL LOW (ref 39.0–52.0)
HEMOGLOBIN: 13.1 g/dL (ref 13.0–17.0)
LYMPHS ABS: 3 10*3/uL (ref 0.7–4.0)
Lymphocytes Relative: 41 % (ref 12–46)
MCH: 28.1 pg (ref 26.0–34.0)
MCHC: 34.3 g/dL (ref 30.0–36.0)
MCV: 82 fL (ref 78.0–100.0)
MONO ABS: 0.6 10*3/uL (ref 0.1–1.0)
MONOS PCT: 8 % (ref 3–12)
Neutro Abs: 3.6 10*3/uL (ref 1.7–7.7)
Neutrophils Relative %: 48 % (ref 43–77)
Platelets: 223 10*3/uL (ref 150–400)
RBC: 4.66 MIL/uL (ref 4.22–5.81)
RDW: 16.7 % — ABNORMAL HIGH (ref 11.5–15.5)
WBC: 7.4 10*3/uL (ref 4.0–10.5)

## 2013-08-17 LAB — COMPLETE METABOLIC PANEL WITH GFR
ALK PHOS: 101 U/L (ref 39–117)
ALT: 38 U/L (ref 0–53)
AST: 26 U/L (ref 0–37)
Albumin: 3.9 g/dL (ref 3.5–5.2)
BILIRUBIN TOTAL: 0.4 mg/dL (ref 0.2–1.2)
BUN: 13 mg/dL (ref 6–23)
CO2: 29 meq/L (ref 19–32)
CREATININE: 0.9 mg/dL (ref 0.50–1.35)
Calcium: 9.1 mg/dL (ref 8.4–10.5)
Chloride: 100 mEq/L (ref 96–112)
GFR, Est Non African American: >89 mL/min
GLUCOSE: 78 mg/dL (ref 70–99)
Potassium: 4.2 mEq/L (ref 3.5–5.3)
Sodium: 137 mEq/L (ref 135–145)
Total Protein: 7.9 g/dL (ref 6.0–8.3)

## 2013-08-17 LAB — HEPATITIS B SURFACE ANTIBODY,QUALITATIVE: Hep B S Ab: NEGATIVE

## 2013-08-17 LAB — HEPATITIS B CORE ANTIBODY, TOTAL: Hep B Core Total Ab: NONREACTIVE

## 2013-08-17 NOTE — Progress Notes (Signed)
Subjective:    Patient ID: Jimmy Davis, male    DOB: 08-06-1980, 33 y.o.   MRN: [id number]  HPI Jimmy Davis, 32yo M with HIV, CD 4 count of 400/VL 104,987 seen in mid December started on stribild. Now on meds x 6 wks. He states he is doing well with stribild. Not missing a dose. Not having much side effects. 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 Just started a 3 wk leave of absence. He also reprots being on the 5th day of having upper respiratory infection that includes nasal congestion, cough, chills, not owrsening . Has not tried any over the counter meds yet.  Current Outpatient Prescriptions on File Prior to Visit  Medication Sig Dispense Refill  . benzonatate (TESSALON) 100 MG capsule Take 1-2 capsules (100-200 mg total) by mouth 3 (three) times daily as needed for cough.  40 capsule  0  . busPIRone (BUSPAR) 30 MG tablet Take 30 mg by mouth 2 (two) times daily.      . clonazePAM (KLONOPIN) 0.5 MG tablet Take 1 tablet (0.5 mg total) by mouth 2 (two) times daily as needed for anxiety.  20 tablet  1  . elvitegravir-cobicistat-emtricitabine-tenofovir (STRIBILD) 150-150-200-300 MG TABS tablet Take 1 tablet by mouth daily with breakfast.  30 tablet  11  . FLUoxetine (PROZAC) 40 MG capsule Take 40 mg by mouth daily.      Marland Kitchen. HYDROcodone-homatropine (HYCODAN) 5-1.5 MG/5ML syrup Take 5 mLs by mouth every 4 (four) hours as needed for cough.  120 mL  0  . loperamide (LOPERAMIDE A-D) 2 MG tablet Take 2 tablets (4 mg total) by mouth 3 (three) times daily as needed for diarrhea or loose stools.  30 tablet  0   No current facility-administered medications on file prior to visit.   Active Ambulatory Problems    Diagnosis Date Noted  . Human immunodeficiency virus (HIV) disease 06/29/2013  . Chronic diarrhea 07/27/2013   Resolved Ambulatory Problems    Diagnosis Date Noted  . No Resolved Ambulatory Problems   Past Medical  History  Diagnosis Date  . Anxiety   . Depression   . Substance abuse      Review of Systems 12 point ROS reviewed. postive pertinents listed in hpi    Objective:   Physical Exam BP 122/84  Pulse 111  Temp(Src) 98.7 F (37.1 C) (Oral)  Wt 155 lb (70.308 kg) Physical Exam  Constitutional: He is oriented to person, place, and time. He appears well-developed and well-nourished. No distress.  HENT:  Mouth/Throat: Oropharynx is clear and moist. No oropharyngeal exudate.  Cardiovascular: Normal rate, regular rhythm and normal heart sounds. Exam reveals no gallop and no friction rub.  No murmur heard.  Pulmonary/Chest: Effort normal and breath sounds normal. No respiratory distress. He has no wheezes.  Abdominal: Soft. Bowel sounds are normal. He exhibits no distension. There is no tenderness.  Lymphadenopathy:  He has no cervical adenopathy.  Neurological: He is alert and oriented to person, place, and time.  Skin: Skin is warm and dry. No rash noted. No erythema.  Psychiatric: He has a normal mood and affect. His behavior is normal.          Assessment & Plan:  HIV = check labs today  Depression/anxiety= needs a counselor, give referral to family health services  Health maintenance = will check hep b s ab  Upper respiratory infection = likely viral. rec to  do otc meds  rtc in 6 wk

## 2013-08-18 LAB — T-HELPER CELL (CD4) - (RCID CLINIC ONLY)
CD4 T CELL ABS: 460 /uL (ref 400–2700)
CD4 T CELL HELPER: 14 % — AB (ref 33–55)

## 2013-08-18 LAB — HIV-1 RNA QUANT-NO REFLEX-BLD
HIV 1 RNA QUANT: 23 {copies}/mL — AB (ref <20)
HIV-1 RNA Quant, Log: 1.36 {Log} — ABNORMAL HIGH (ref <1.30)

## 2013-09-28 ENCOUNTER — Ambulatory Visit (INDEPENDENT_AMBULATORY_CARE_PROVIDER_SITE_OTHER): Payer: Self-pay | Admitting: Internal Medicine

## 2013-09-28 ENCOUNTER — Encounter: Payer: Self-pay | Admitting: Internal Medicine

## 2013-09-28 VITALS — BP 106/68 | HR 84 | Temp 98.5°F | Ht 69.0 in | Wt 167.0 lb

## 2013-09-28 DIAGNOSIS — B20 Human immunodeficiency virus [HIV] disease: Secondary | ICD-10-CM

## 2013-09-28 DIAGNOSIS — F1910 Other psychoactive substance abuse, uncomplicated: Secondary | ICD-10-CM

## 2013-09-28 DIAGNOSIS — F3289 Other specified depressive episodes: Secondary | ICD-10-CM

## 2013-09-28 DIAGNOSIS — F329 Major depressive disorder, single episode, unspecified: Secondary | ICD-10-CM

## 2013-09-28 DIAGNOSIS — F32A Depression, unspecified: Secondary | ICD-10-CM

## 2013-09-28 NOTE — Progress Notes (Signed)
   Subjective:    Patient ID: Jimmy Davis, male    DOB: 06-12-1981, 33 y.o.   MRN: [id number]  HPI 33yo M with HIV, Cd 4 count of 460/VL 23, on stribild. Still having significant depression since loss of job. No thoughts of harm to himself. Currently on buspar and prozac. He is followed had [facility] for counseling, substance abuse counseling and psychiatry. Started adap application process will submit W2 this week. He is currently unemployed, looking for a job. 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  Current Outpatient Prescriptions on File Prior to Visit  Medication Sig Dispense Refill  . busPIRone (BUSPAR) 30 MG tablet Take 10 mg by mouth 3 (three) times daily.       Marland Kitchen. elvitegravir-cobicistat-emtricitabine-tenofovir (STRIBILD) 150-150-200-300 MG TABS tablet Take 1 tablet by mouth daily with breakfast.  30 tablet  11  . loperamide (LOPERAMIDE A-D) 2 MG tablet Take 2 tablets (4 mg total) by mouth 3 (three) times daily as needed for diarrhea or loose stools.  30 tablet  0   No current facility-administered medications on file prior to visit.   Active Ambulatory Problems    Diagnosis Date Noted  . Human immunodeficiency virus (HIV) disease 06/29/2013  . Chronic diarrhea 07/27/2013   Resolved Ambulatory Problems    Diagnosis Date Noted  . No Resolved Ambulatory Problems   Past Medical History  Diagnosis Date  . Anxiety   . Depression   . Substance abuse        Review of Systems Constitutional: Negative for fever, chills, diaphoresis, activity change, appetite change, fatigue and unexpected weight change.  HENT: Negative for congestion, sore throat, rhinorrhea, sneezing, trouble swallowing and sinus pressure.  Eyes: Negative for photophobia and visual disturbance.  Respiratory: Negative for cough, chest tightness, shortness of breath, wheezing and stridor.  Cardiovascular: Negative for chest pain, palpitations and leg swelling.    Gastrointestinal: Negative for nausea, vomiting, abdominal pain, diarrhea, constipation, blood in stool, abdominal distention and anal bleeding.  Genitourinary: Negative for dysuria, hematuria, flank pain and difficulty urinating.  Musculoskeletal: Negative for myalgias, back pain, joint swelling, arthralgias and gait problem.  Skin: Negative for color change, pallor, rash and wound.  Neurological: Negative for dizziness, tremors, weakness and light-headedness.  Hematological: Negative for adenopathy. Does not bruise/bleed easily.  Psychiatric/Behavioral: depression      Objective:   Physical Exam BP 106/68  Pulse 84  Temp(Src) 98.5 F (36.9 C) (Oral)  Ht 5\' 9"  (1.753 m)  Wt 167 lb (75.751 kg)  BMI 24.65 kg/m2 Physical Exam  Constitutional: He is oriented to person, place, and time. He appears well-developed and well-nourished. No distress.  Neurological: He is alert and oriented to person, place, and time.  Skin: Skin is warm and dry. No rash noted. No erythema. No signs of injection marks Psychiatric: He has a normal mood and affect. His behavior is normal.      Assessment & Plan:  hiv = well controlled, continue with stribild will need to make sure he gets adap approval  Drug use = discussed for majority of visit concern for relapse and that it would interfere with his daily function, getting back on a job, and worsening depression. Advocated to keep up with counseling.  rtc in 4 wk

## 2013-09-29 ENCOUNTER — Encounter: Payer: Self-pay | Admitting: *Deleted

## 2013-09-30 ENCOUNTER — Other Ambulatory Visit: Payer: Self-pay | Admitting: *Deleted

## 2013-09-30 DIAGNOSIS — B20 Human immunodeficiency virus [HIV] disease: Secondary | ICD-10-CM

## 2013-09-30 MED ORDER — ELVITEG-COBIC-EMTRICIT-TENOFDF 150-150-200-300 MG PO TABS: 1.0000 | ORAL_TABLET | Freq: Every day | ORAL | Status: DC

## 2013-10-21 ENCOUNTER — Other Ambulatory Visit: Payer: Self-pay | Admitting: Licensed Clinical Social Worker

## 2013-10-21 ENCOUNTER — Telehealth: Payer: Self-pay | Admitting: Licensed Clinical Social Worker

## 2013-10-21 MED ORDER — LOPERAMIDE HCL 2 MG PO TABS: 4.0000 mg | ORAL_TABLET | Freq: Three times a day (TID) | ORAL | Status: DC | PRN

## 2013-10-21 NOTE — Telephone Encounter (Signed)
Case manager from [facility] wanted to make sure patient gets his HEP A and Hep B vaccine since his antibody was negative at last labs. She also states that patient admitted to using needles for drug use and would like to be tested for TB. Patient is coming in to see Dr. Drue SecondSnider on 4/23

## 2013-10-28 ENCOUNTER — Ambulatory Visit (INDEPENDENT_AMBULATORY_CARE_PROVIDER_SITE_OTHER): Payer: Self-pay | Admitting: Internal Medicine

## 2013-10-28 ENCOUNTER — Encounter: Payer: Self-pay | Admitting: Internal Medicine

## 2013-10-28 VITALS — BP 130/76 | HR 78 | Temp 98.3°F | Wt 173.0 lb

## 2013-10-28 DIAGNOSIS — F1910 Other psychoactive substance abuse, uncomplicated: Secondary | ICD-10-CM

## 2013-10-28 DIAGNOSIS — B20 Human immunodeficiency virus [HIV] disease: Secondary | ICD-10-CM

## 2013-10-28 DIAGNOSIS — F3289 Other specified depressive episodes: Secondary | ICD-10-CM

## 2013-10-28 DIAGNOSIS — F329 Major depressive disorder, single episode, unspecified: Secondary | ICD-10-CM

## 2013-10-28 DIAGNOSIS — F32A Depression, unspecified: Secondary | ICD-10-CM

## 2013-10-28 NOTE — Progress Notes (Signed)
   Subjective:    Patient ID: Jimmy Davis, male    DOB: Oct 31, 1980, 33 y.o.   MRN: [id number]  HPI 33yo M with HIV, CD 4 count of 430/VL 23 on stribild, also suffers from depression and prior meth use. Since i last saw him 1 month ago, he states his step dad passed away. It has been a stressor for him since it is difficult to see his mother being so distraught. He has not used meth in 3 wks. Not sought other counseling but participates in [facility].  Current Outpatient Prescriptions on File Prior to Visit  Medication Sig Dispense Refill  . busPIRone (BUSPAR) 30 MG tablet Take 10 mg by mouth 3 (three) times daily.       Marland Kitchen. elvitegravir-cobicistat-emtricitabine-tenofovir (STRIBILD) 150-150-200-300 MG TABS tablet Take 1 tablet by mouth daily with breakfast.  30 tablet  11  . FLUoxetine (PROZAC) 20 MG capsule Take 60 mg by mouth daily.      Marland Kitchen. loperamide (LOPERAMIDE A-D) 2 MG tablet Take 2 tablets (4 mg total) by mouth 3 (three) times daily as needed for diarrhea or loose stools.  30 tablet  0   No current facility-administered medications on file prior to visit.   Active Ambulatory Problems    Diagnosis Date Noted  . Human immunodeficiency virus (HIV) disease 06/29/2013  . Chronic diarrhea 07/27/2013   Resolved Ambulatory Problems    Diagnosis Date Noted  . No Resolved Ambulatory Problems   Past Medical History  Diagnosis Date  . Anxiety   . Depression   . Substance abuse        Review of Systems 10 ponit ros is negative    Objective:   Physical Exam BP 130/76  Pulse 78  Temp(Src) 98.3 F (36.8 C) (Oral)  Wt 173 lb (78.472 kg) Physical Exam  Constitutional: He is oriented to person, place, and time. He appears well-developed and well-nourished. No distress.  HENT:  Mouth/Throat: Oropharynx is clear and moist. No oropharyngeal exudate.  Cardiovascular: Normal rate, regular rhythm and normal heart sounds. Exam reveals no gallop and no friction rub.  No murmur  heard.  Pulmonary/Chest: Effort normal and breath sounds normal. No respiratory distress. He has no wheezes.  Abdominal: Soft. Bowel sounds are normal. He exhibits no distension. There is no tenderness.  Lymphadenopathy:  He has no cervical adenopathy.  Neurological: He is alert and oriented to person, place, and time. akisthesia Skin: Skin is warm and dry. No rash noted. No erythema.  Psychiatric: He has a normal mood and affect. His behavior is normal. Appears mildly anxious        Assessment & Plan:  hiv = well controlled, now on adap.  Meth abuse = introduced him to max today and hopes to continue relationship and counseling  Depression = he sees community psychiatrist. Continue on current on buspar and prozac

## 2013-11-04 ENCOUNTER — Ambulatory Visit: Payer: Self-pay | Admitting: *Deleted

## 2013-11-10 ENCOUNTER — Encounter: Payer: Self-pay | Admitting: Licensed Clinical Social Worker

## 2013-11-10 NOTE — Progress Notes (Signed)
CM-Taiten Brawn assigned to CT until 09/2014

## 2013-12-07 ENCOUNTER — Ambulatory Visit (INDEPENDENT_AMBULATORY_CARE_PROVIDER_SITE_OTHER): Payer: Self-pay | Admitting: Internal Medicine

## 2013-12-07 ENCOUNTER — Encounter: Payer: Self-pay | Admitting: Internal Medicine

## 2013-12-07 VITALS — BP 94/56 | HR 78 | Temp 98.2°F | Wt 172.0 lb

## 2013-12-07 DIAGNOSIS — F1910 Other psychoactive substance abuse, uncomplicated: Secondary | ICD-10-CM

## 2013-12-07 DIAGNOSIS — Z0000 Encounter for general adult medical examination without abnormal findings: Secondary | ICD-10-CM

## 2013-12-07 DIAGNOSIS — B20 Human immunodeficiency virus [HIV] disease: Secondary | ICD-10-CM

## 2013-12-07 DIAGNOSIS — F4323 Adjustment disorder with mixed anxiety and depressed mood: Secondary | ICD-10-CM

## 2013-12-07 DIAGNOSIS — Z23 Encounter for immunization: Secondary | ICD-10-CM

## 2013-12-07 LAB — CBC WITH DIFFERENTIAL/PLATELET
Basophils Absolute: 0 10*3/uL (ref 0.0–0.1)
Basophils Relative: 0 % (ref 0–1)
EOS ABS: 0.2 10*3/uL (ref 0.0–0.7)
Eosinophils Relative: 3 % (ref 0–5)
HCT: 39.8 % (ref 39.0–52.0)
HEMOGLOBIN: 14.1 g/dL (ref 13.0–17.0)
Lymphocytes Relative: 47 % — ABNORMAL HIGH (ref 12–46)
Lymphs Abs: 2.5 10*3/uL (ref 0.7–4.0)
MCH: 30.2 pg (ref 26.0–34.0)
MCHC: 35.4 g/dL (ref 30.0–36.0)
MCV: 85.2 fL (ref 78.0–100.0)
MONO ABS: 0.5 10*3/uL (ref 0.1–1.0)
MONOS PCT: 10 % (ref 3–12)
NEUTROS ABS: 2.1 10*3/uL (ref 1.7–7.7)
NEUTROS PCT: 40 % — AB (ref 43–77)
Platelets: 213 10*3/uL (ref 150–400)
RBC: 4.67 MIL/uL (ref 4.22–5.81)
RDW: 15.7 % — AB (ref 11.5–15.5)
WBC: 5.3 10*3/uL (ref 4.0–10.5)

## 2013-12-07 LAB — COMPLETE METABOLIC PANEL WITH GFR
ALT: 36 U/L (ref 0–53)
AST: 28 U/L (ref 0–37)
Albumin: 4.4 g/dL (ref 3.5–5.2)
Alkaline Phosphatase: 97 U/L (ref 39–117)
BILIRUBIN TOTAL: 0.3 mg/dL (ref 0.2–1.2)
BUN: 18 mg/dL (ref 6–23)
CO2: 31 mEq/L (ref 19–32)
Calcium: 9.4 mg/dL (ref 8.4–10.5)
Chloride: 102 mEq/L (ref 96–112)
Creat: 0.9 mg/dL (ref 0.50–1.35)
GFR, Est African American: >89 mL/min
GFR, Est Non African American: >89 mL/min
GLUCOSE: 74 mg/dL (ref 70–99)
POTASSIUM: 4.2 meq/L (ref 3.5–5.3)
SODIUM: 138 meq/L (ref 135–145)
Total Protein: 7.5 g/dL (ref 6.0–8.3)

## 2013-12-07 NOTE — Progress Notes (Signed)
Subjective:    Patient ID: Jimmy Davis, male    DOB: Nov 03, 1980, 33 y.o.   MRN: [id number]  HPI 33yo M with HIV, CD 4 count of 430/VL 23 on stribild, also suffers from depression and prior meth use. He is steadily having better mood/coping from his father passing away. [facility]. He gets [facility] med[facility] from [facility]. No other counseling. He is looking for a job in accounting  Has not used any meth or other illegal drugs in 2-3 months  Current Outpatient Prescriptions on File Prior to Visit  Medication Sig Dispense Refill  . busPIRone (BUSPAR) 30 MG tablet Take 10 mg by mouth 3 (three) times daily.       Marland Kitchen. elvitegravir-cobicistat-emtricitabine-tenofovir (STRIBILD) 150-150-200-300 MG TABS tablet Take 1 tablet by mouth daily with breakfast.  30 tablet  11  . FLUoxetine (PROZAC) 20 MG capsule Take 60 mg by mouth daily.      Marland Kitchen. loperamide (LOPERAMIDE A-D) 2 MG tablet Take 2 tablets (4 mg total) by mouth 3 (three) times daily as needed for diarrhea or loose stools.  30 tablet  0   No current facility-administered medications on file prior to visit.   Active Ambulatory Problems    Diagnosis Date Noted  . Human immunodeficiency virus (HIV) disease 06/29/2013  . Chronic diarrhea 07/27/2013   Resolved Ambulatory Problems    Diagnosis Date Noted  . No Resolved Ambulatory Problems   Past Medical History  Diagnosis Date  . Anxiety   . Depression   . Substance abuse     Soc hx: hanging out with someone new. No sexual intercourse yet. He has disclosed his status to his new partner  Review of Systems Review of Systems  Constitutional: Negative for fever, chills, diaphoresis, activity change, appetite change, fatigue and unexpected weight change.  HENT: Negative for congestion, sore throat, rhinorrhea, sneezing, trouble swallowing and sinus pressure.  Eyes: Negative for photophobia and visual disturbance.  Respiratory:  Negative for cough, chest tightness, shortness of breath, wheezing and stridor.  Cardiovascular: Negative for chest pain, palpitations and leg swelling.  Gastrointestinal: Negative for nausea, vomiting, abdominal pain, diarrhea, constipation, blood in stool, abdominal distention and anal bleeding.  Genitourinary: Negative for dysuria, hematuria, flank pain and difficulty urinating.  Musculoskeletal: Negative for myalgias, back pain, joint swelling, arthralgias and gait problem.  Skin: Negative for color change, pallor, rash and wound.  Neurological: Negative for dizziness, tremors, weakness and light-headedness.  Hematological: Negative for adenopathy. Does not bruise/bleed easily.  Psychiatric/Behavioral: Negative for behavioral problems, confusion, sleep disturbance, dysphoric mood, decreased concentration and agitation.       Objective:   Physical Exam BP 94/56  Pulse 78  Temp(Src) 98.2 F (36.8 C) (Oral)  Wt 172 lb (78.019 kg) Physical Exam  Constitutional: He is oriented to person, place, and time. He appears well-developed and well-nourished. No distress.  HENT:  Mouth/Throat: Oropharynx is clear and moist. No oropharyngeal exudate.  Lymphadenopathy:  He has no cervical adenopathy.  Neurological: He is alert and oriented to person, place, and time.  Skin: Skin is warm and dry. No rash noted. No erythema.  Psychiatric: He has a normal mood and affect. His behavior is normal.        Assessment & Plan:  hiv = well controlled, continue with stribild  Drug use = remains sober now for 3 months. Encouraged continued behavior to keep clean  Depression = continue on prozac and buspar  Health maintenance = will give hep a ,hep b today and check ppd  rtc in 6 wk

## 2013-12-07 NOTE — Addendum Note (Signed)
Addended by: Lurlean Leyden on: 12/07/2013 03:16 PM   Modules accepted: Orders

## 2013-12-08 LAB — HIV-1 RNA QUANT-NO REFLEX-BLD
HIV 1 RNA Quant: <20 copies/mL (ref <20)
HIV-1 RNA Quant, Log: <1.3 {Log} (ref <1.30)

## 2013-12-08 LAB — T-HELPER CELL (CD4) - (RCID CLINIC ONLY)
CD4 % Helper T Cell: 19 % — ABNORMAL LOW (ref 33–55)
CD4 T Cell Abs: 540 /uL (ref 400–2700)

## 2013-12-09 ENCOUNTER — Ambulatory Visit: Payer: Self-pay | Admitting: Internal Medicine

## 2013-12-09 LAB — TB SKIN TEST
Induration: 0 mm
TB SKIN TEST: NEGATIVE

## 2014-01-04 ENCOUNTER — Ambulatory Visit (INDEPENDENT_AMBULATORY_CARE_PROVIDER_SITE_OTHER): Payer: Self-pay | Admitting: *Deleted

## 2014-01-04 ENCOUNTER — Encounter: Payer: Self-pay | Admitting: Internal Medicine

## 2014-01-04 DIAGNOSIS — Z23 Encounter for immunization: Secondary | ICD-10-CM

## 2014-02-15 IMAGING — CR DG ABDOMEN ACUTE W/ 1V CHEST
3 series · 3 of 3 positions shown · non-contrast
Comparison: None.

CLINICAL DATA: Abdominal pain.  Diarrhea.  STD screening.

EXAM:
ACUTE ABDOMEN SERIES (ABDOMEN 2 VIEW & CHEST 1 VIEW)

[PA]
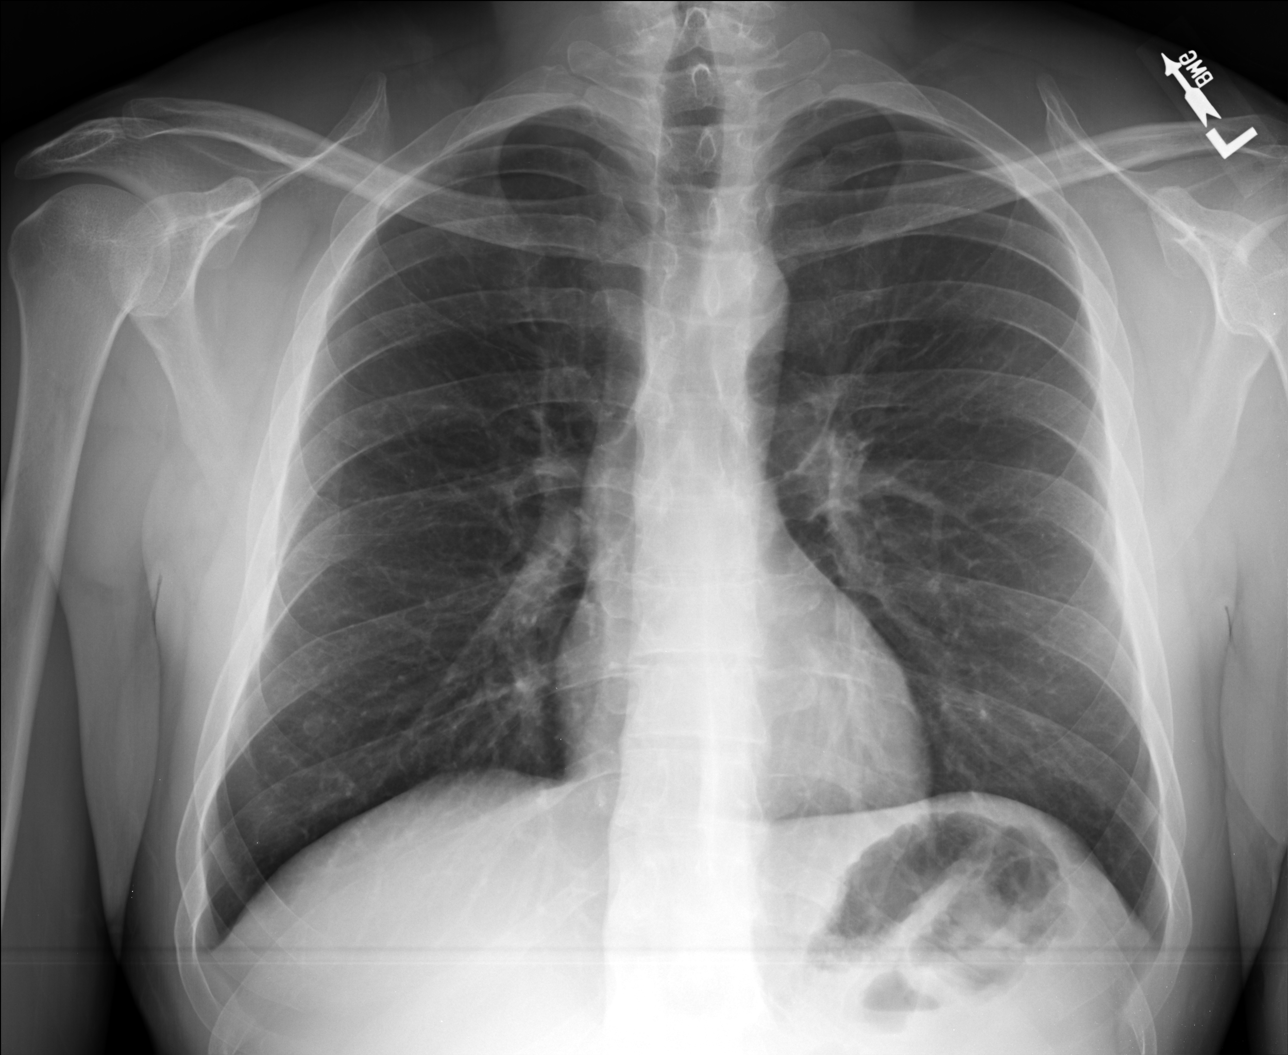

[AP (1 of 2)]
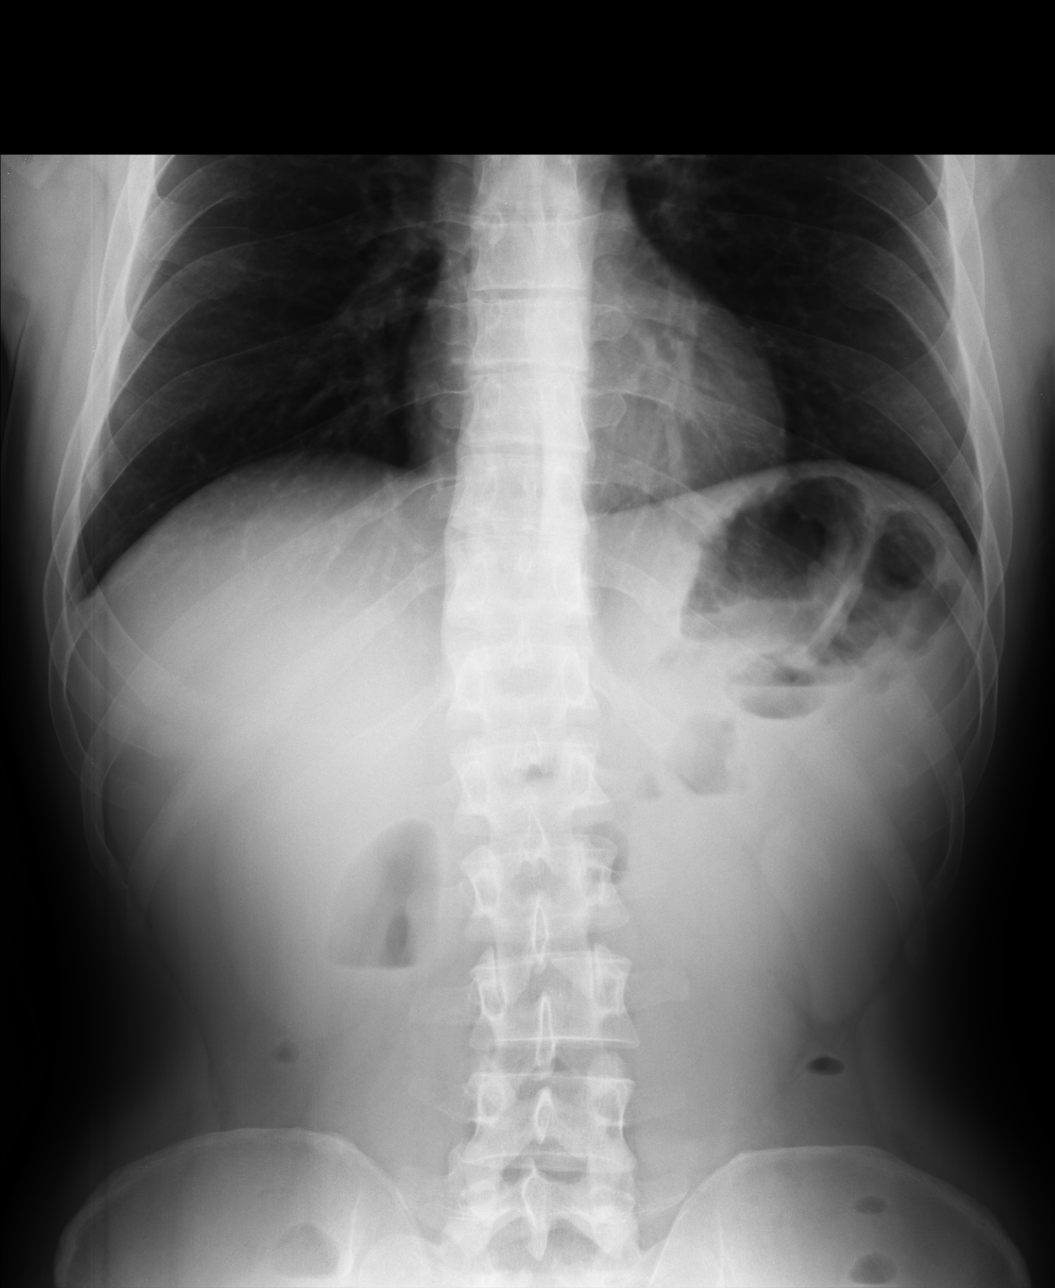

[AP (2 of 2)]
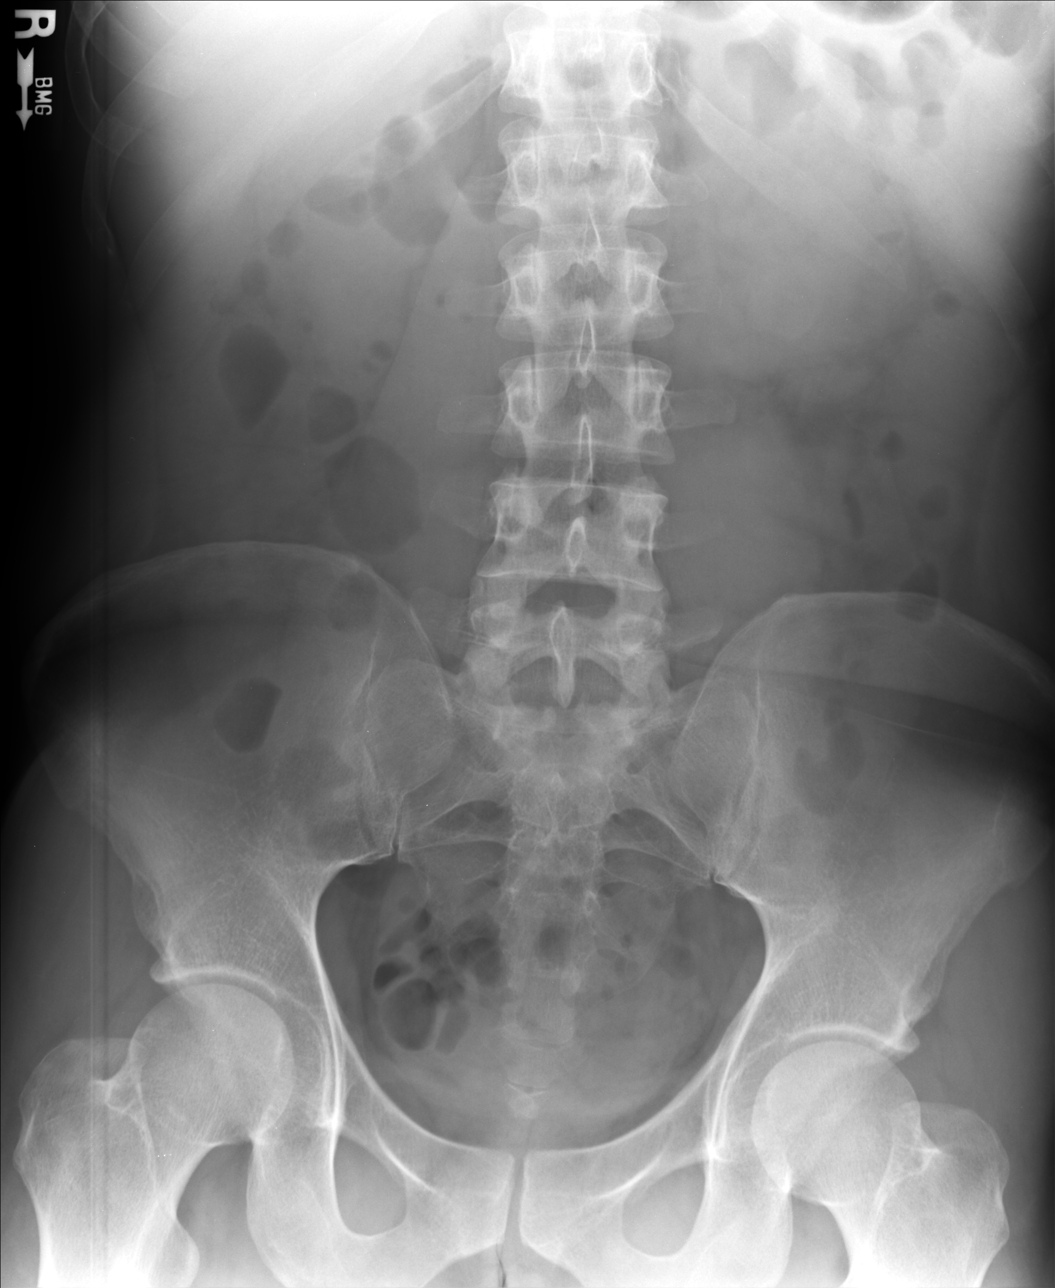

[3 of 3 positions shown; findings below may reference images not displayed]

FINDINGS: There is no evidence of dilated bowel loops or free intraperitoneal
air. A few scattered air-fluid levels are seen within the abdomen
and pelvis. No radiopaque calculi or other significant radiographic
abnormality is seen.

Heart size and mediastinal contours are within normal limits. Both
lungs are clear.
IMPRESSION: Nonspecific, nonobstructive bowel gas pattern.

No active cardiopulmonary disease.

## 2014-03-10 ENCOUNTER — Ambulatory Visit: Payer: Self-pay | Admitting: Internal Medicine

## 2014-03-21 ENCOUNTER — Encounter: Payer: Self-pay | Admitting: Internal Medicine

## 2014-03-21 ENCOUNTER — Ambulatory Visit: Payer: Self-pay | Admitting: Internal Medicine

## 2014-04-05 ENCOUNTER — Ambulatory Visit (INDEPENDENT_AMBULATORY_CARE_PROVIDER_SITE_OTHER): Payer: Self-pay | Admitting: Internal Medicine

## 2014-04-05 ENCOUNTER — Encounter: Payer: Self-pay | Admitting: Internal Medicine

## 2014-04-05 VITALS — BP 122/76 | HR 71 | Temp 97.8°F | Wt 161.0 lb

## 2014-04-05 DIAGNOSIS — B20 Human immunodeficiency virus [HIV] disease: Secondary | ICD-10-CM

## 2014-04-05 DIAGNOSIS — L705 Acne excoriee des jeunes filles: Secondary | ICD-10-CM

## 2014-04-05 DIAGNOSIS — F1910 Other psychoactive substance abuse, uncomplicated: Secondary | ICD-10-CM

## 2014-04-05 DIAGNOSIS — L708 Other acne: Secondary | ICD-10-CM

## 2014-04-05 DIAGNOSIS — Z23 Encounter for immunization: Secondary | ICD-10-CM

## 2014-04-05 MED ORDER — SULFAMETHOXAZOLE-TMP DS 800-160 MG PO TABS: 1.0000 | ORAL_TABLET | Freq: Two times a day (BID) | ORAL | Status: DC

## 2014-04-05 NOTE — Progress Notes (Signed)
Patient ID: Jimmy Davis, male   DOB: 12-Feb-1981, 33 y.o.   MRN: [id number]       Patient ID: Jimmy Davis, male   DOB: 12-Feb-1981, 33 y.o.   MRN: [id number]  HPI  cd 4 count of 540/VL<20 on stribild, doing well with adherence. Still struggles with meth use. He used 2-3 wk ago, and states that if he had money that it would be difficult to avoid using again. He is starting to attend group at [facility]. He has also noticed red bump on buttock  No Known Allergies  Outpatient Encounter Prescriptions as of 04/05/2014  Medication Sig  . busPIRone (BUSPAR) 30 MG tablet Take 10 mg by mouth 3 (three) times daily.   Marland Kitchen. elvitegravir-cobicistat-emtricitabine-tenofovir (STRIBILD) 150-150-200-300 MG TABS tablet Take 1 tablet by mouth daily with breakfast.  . FLUoxetine (PROZAC) 20 MG capsule Take 60 mg by mouth daily.  Marland Kitchen. loperamide (LOPERAMIDE A-D) 2 MG tablet Take 2 tablets (4 mg total) by mouth 3 (three) times daily as needed for diarrhea or loose stools.     Patient Active Problem List   Diagnosis Date Noted  . Chronic diarrhea 07/27/2013  . Human immunodeficiency virus (HIV) disease 06/29/2013     Health Maintenance Due  Topic Date Due  . Tetanus/tdap  07/02/1998  . Influenza Vaccine  02/05/2014     Review of Systems Buttock rash otherwise 10 point ros is negative Physical Exam   BP 122/76  Pulse 71  Temp(Src) 97.8 F (36.6 C) (Oral)  Wt 161 lb (73.029 kg) Physical Exam  Constitutional: He is oriented to person, place, and time. He appears well-developed and well-nourished. No distress.  HENT:  Mouth/Throat: Oropharynx is clear and moist. No oropharyngeal exudate.  Cardiovascular: Normal rate, regular rhythm and normal heart sounds. Exam reveals no gallop and no friction rub.  No murmur heard.  Pulmonary/Chest: Effort normal and breath sounds normal. No respiratory distress. He has no wheezes.  Abdominal: Soft. Bowel sounds are normal. He exhibits no  distension. There is no tenderness.  Lymphadenopathy:  He has no cervical adenopathy.  Neurological: He is alert and oriented to person, place, and time.  Skin: Skin is warm and dry. Scattered acne to buttock Psychiatric: He has a normal mood and affect. His behavior is normal.    Lab Results  Component Value Date   CD4TCELL 19* 12/07/2013   Lab Results  Component Value Date   CD4TABS 540 12/07/2013   CD4TABS 460 08/17/2013   Lab Results  Component Value Date   HIV1RNAQUANT <20 12/07/2013   Lab Results  Component Value Date   HEPBSAB NEG 08/17/2013   No results found for this basename: RPR    CBC Lab Results  Component Value Date   WBC 5.3 12/07/2013   RBC 4.67 12/07/2013   HGB 14.1 12/07/2013   HCT 39.8 12/07/2013   PLT 213 12/07/2013   MCV 85.2 12/07/2013   MCH 30.2 12/07/2013   MCHC 35.4 12/07/2013   RDW 15.7* 12/07/2013   LYMPHSABS 2.5 12/07/2013   MONOABS 0.5 12/07/2013   EOSABS 0.2 12/07/2013   BASOSABS 0.0 12/07/2013   BMET Lab Results  Component Value Date   NA 138 12/07/2013   K 4.2 12/07/2013   CL 102 12/07/2013   CO2 31 12/07/2013   GLUCOSE 74 12/07/2013   BUN 18 12/07/2013   CREATININE 0.90 12/07/2013   CALCIUM 9.4 12/07/2013   GFRNONAA >89 12/07/2013   GFRAA >89 12/07/2013     Assessment and  Plan   hiv = well controlled continue on stribild  Meth use = encouraged continue with counseling and sobriety  Ssti/boils= will give short course of bactrim ds 1 bid x 7 days  Health maintenance = will give flu shot today

## 2014-06-08 ENCOUNTER — Ambulatory Visit: Payer: Self-pay

## 2014-06-16 ENCOUNTER — Encounter: Payer: Self-pay | Admitting: Internal Medicine

## 2014-06-16 ENCOUNTER — Ambulatory Visit (INDEPENDENT_AMBULATORY_CARE_PROVIDER_SITE_OTHER): Payer: Self-pay | Admitting: Internal Medicine

## 2014-06-16 VITALS — BP 101/59 | HR 70 | Temp 97.5°F | Wt 175.0 lb

## 2014-06-16 DIAGNOSIS — B20 Human immunodeficiency virus [HIV] disease: Secondary | ICD-10-CM

## 2014-06-16 DIAGNOSIS — Z21 Asymptomatic human immunodeficiency virus [HIV] infection status: Secondary | ICD-10-CM

## 2014-06-16 DIAGNOSIS — Z23 Encounter for immunization: Secondary | ICD-10-CM

## 2014-06-16 DIAGNOSIS — F1910 Other psychoactive substance abuse, uncomplicated: Secondary | ICD-10-CM

## 2014-06-16 DIAGNOSIS — Z113 Encounter for screening for infections with a predominantly sexual mode of transmission: Secondary | ICD-10-CM

## 2014-06-16 LAB — COMPLETE METABOLIC PANEL WITH GFR
ALBUMIN: 4.4 g/dL (ref 3.5–5.2)
ALT: 31 U/L (ref 0–53)
AST: 21 U/L (ref 0–37)
Alkaline Phosphatase: 122 U/L — ABNORMAL HIGH (ref 39–117)
BUN: 17 mg/dL (ref 6–23)
CALCIUM: 9.3 mg/dL (ref 8.4–10.5)
CHLORIDE: 102 meq/L (ref 96–112)
CO2: 29 meq/L (ref 19–32)
Creat: 0.84 mg/dL (ref 0.50–1.35)
GFR, Est African American: >89 mL/min
GLUCOSE: 75 mg/dL (ref 70–99)
POTASSIUM: 4.7 meq/L (ref 3.5–5.3)
Sodium: 138 mEq/L (ref 135–145)
TOTAL PROTEIN: 7.2 g/dL (ref 6.0–8.3)
Total Bilirubin: 0.3 mg/dL (ref 0.2–1.2)

## 2014-06-16 LAB — CBC WITH DIFFERENTIAL/PLATELET
BASOS PCT: 0 % (ref 0–1)
Basophils Absolute: 0 10*3/uL (ref 0.0–0.1)
Eosinophils Absolute: 0.1 10*3/uL (ref 0.0–0.7)
Eosinophils Relative: 2 % (ref 0–5)
HCT: 42 % (ref 39.0–52.0)
Hemoglobin: 14.8 g/dL (ref 13.0–17.0)
LYMPHS PCT: 47 % — AB (ref 12–46)
Lymphs Abs: 2.5 10*3/uL (ref 0.7–4.0)
MCH: 30.9 pg (ref 26.0–34.0)
MCHC: 35.2 g/dL (ref 30.0–36.0)
MCV: 87.7 fL (ref 78.0–100.0)
MONO ABS: 0.5 10*3/uL (ref 0.1–1.0)
MPV: 8.4 fL — ABNORMAL LOW (ref 9.4–12.4)
Monocytes Relative: 9 % (ref 3–12)
Neutro Abs: 2.2 10*3/uL (ref 1.7–7.7)
Neutrophils Relative %: 42 % — ABNORMAL LOW (ref 43–77)
Platelets: 259 10*3/uL (ref 150–400)
RBC: 4.79 MIL/uL (ref 4.22–5.81)
RDW: 14.1 % (ref 11.5–15.5)
WBC: 5.3 10*3/uL (ref 4.0–10.5)

## 2014-06-16 NOTE — Progress Notes (Signed)
Patient ID: Jimmy Davis, male   DOB: Jun 06, 1981, 33 y.o.   MRN: [id number]       Patient ID: Jimmy Davis, male   DOB: Jun 06, 1981, 33 y.o.   MRN: [id number]  HPI 33yo M with HIV disease, CD 4 count of 540/VL<20 on stribild, also has active substance abuse with using methamphetamines.still using 2-3 per week. Using alone. No injection no sex with strangers. [facility]. currently working 15hr/wk. 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 He continues to take his meds regularly.  Outpatient Encounter Prescriptions as of 06/16/2014  Medication Sig  . busPIRone (BUSPAR) 30 MG tablet Take 10 mg by mouth 3 (three) times daily.   Marland Kitchen. elvitegravir-cobicistat-emtricitabine-tenofovir (STRIBILD) 150-150-200-300 MG TABS tablet Take 1 tablet by mouth daily with breakfast.  . FLUoxetine (PROZAC) 20 MG capsule Take 60 mg by mouth daily.  Marland Kitchen. loperamide (LOPERAMIDE A-D) 2 MG tablet Take 2 tablets (4 mg total) by mouth 3 (three) times daily as needed for diarrhea or loose stools.  Marland Kitchen. sulfamethoxazole-trimethoprim (BACTRIM DS) 800-160 MG per tablet Take 1 tablet by mouth 2 (two) times daily. (Patient not taking: Reported on 06/16/2014)     Patient Active Problem List   Diagnosis Date Noted  . Chronic diarrhea 07/27/2013  . Human immunodeficiency virus (HIV) disease 06/29/2013     Health Maintenance Due  Topic Date Due  . TETANUS/TDAP  07/02/1998     Review of Systems  Constitutional: Negative for fever, chills, diaphoresis, activity change, appetite change, fatigue and unexpected weight change.  HENT: Negative for congestion, sore throat, rhinorrhea, sneezing, trouble swallowing and sinus pressure.  Eyes: Negative for photophobia and visual disturbance.  Respiratory: Negative for cough, chest tightness, shortness of breath, wheezing and stridor.  Cardiovascular: Negative for chest  pain, palpitations and leg swelling.  Gastrointestinal: Negative for nausea, vomiting, abdominal pain, diarrhea, constipation, blood in stool, abdominal distention and anal bleeding.  Genitourinary: Negative for dysuria, hematuria, flank pain and difficulty urinating.  Musculoskeletal: Negative for myalgias, back pain, joint swelling, arthralgias and gait problem.  Skin: Negative for color change, pallor, rash and wound.  Neurological: Negative for dizziness, tremors, weakness and light-headedness.  Hematological: Negative for adenopathy. Does not bruise/bleed easily.  Psychiatric/Behavioral: Negative for behavioral problems, confusion, sleep disturbance, dysphoric mood, decreased concentration and agitation.    Physical Exam   BP 101/59 mmHg  Pulse 70  Temp(Src) 97.5 F (36.4 C) (Oral)  Wt 175 lb (79.379 kg) Physical Exam  Constitutional: He is oriented to person, place, and time. He appears well-developed and well-nourished. No distress.  HENT:  Mouth/Throat: Oropharynx is clear and moist. No oropharyngeal exudate.  Cardiovascular: Normal rate, regular rhythm and normal heart sounds. Exam reveals no gallop and no friction rub.  No murmur heard.  Pulmonary/Chest: Effort normal and breath sounds normal. No respiratory distress. He has no wheezes.  Abdominal: Soft. Bowel sounds are normal. He exhibits no distension. There is no tenderness.  Lymphadenopathy:  He has no cervical adenopathy.  Neurological: He is alert and oriented to person, place, and time.  Skin: Skin is warm and dry. No rash noted. No erythema.  Psychiatric: He has a normal mood and affect. His behavior is normal.    Lab Results  Component Value Date   CD4TCELL 19* 12/07/2013   Lab Results  Component Value Date   CD4TABS 540 12/07/2013   CD4TABS 460 08/17/2013  Lab Results  Component Value Date   HIV1RNAQUANT <20 12/07/2013   Lab Results  Component Value Date   HEPBSAB NEG 08/17/2013   No results  found for: RPR  CBC Lab Results  Component Value Date   WBC 5.3 12/07/2013   RBC 4.67 12/07/2013   HGB 14.1 12/07/2013   HCT 39.8 12/07/2013   PLT 213 12/07/2013   MCV 85.2 12/07/2013   MCH 30.2 12/07/2013   MCHC 35.4 12/07/2013   RDW 15.7* 12/07/2013   LYMPHSABS 2.5 12/07/2013   MONOABS 0.5 12/07/2013   EOSABS 0.2 12/07/2013   BASOSABS 0.0 12/07/2013   BMET Lab Results  Component Value Date   NA 138 12/07/2013   K 4.2 12/07/2013   CL 102 12/07/2013   CO2 31 12/07/2013   GLUCOSE 74 12/07/2013   BUN 18 12/07/2013   CREATININE 0.90 12/07/2013   CALCIUM 9.4 12/07/2013   GFRNONAA >89 12/07/2013   GFRAA >89 12/07/2013     Assessment and Plan   hiv = well controlled. Will check labs  imms = will give hep B #3 and hep A #2  Drug abuse = appears to be going to counseling but still in precontemplative stage since not acting or reducing use. He understands that he has addiction, but taking next steps financially not feasible for him. Recommended harm reduction and continue to encourage going to detox  Greater than 15 min spent in counseling in drug use

## 2014-06-17 LAB — HIV-1 RNA QUANT-NO REFLEX-BLD
HIV 1 RNA Quant: <20 copies/mL (ref <20)
HIV-1 RNA Quant, Log: <1.3 {Log} (ref <1.30)

## 2014-06-17 LAB — T-HELPER CELL (CD4) - (RCID CLINIC ONLY)
CD4 % Helper T Cell: 19 % — ABNORMAL LOW (ref 33–55)
CD4 T CELL ABS: 460 /uL (ref 400–2700)

## 2014-06-17 LAB — RPR

## 2014-07-10 ENCOUNTER — Other Ambulatory Visit: Payer: Self-pay | Admitting: Internal Medicine

## 2014-07-11 ENCOUNTER — Other Ambulatory Visit: Payer: Self-pay | Admitting: Licensed Clinical Social Worker

## 2014-07-11 DIAGNOSIS — B20 Human immunodeficiency virus [HIV] disease: Secondary | ICD-10-CM

## 2014-07-11 MED ORDER — ELVITEG-COBIC-EMTRICIT-TENOFDF 150-150-200-300 MG PO TABS: 1.0000 | ORAL_TABLET | Freq: Every day | ORAL | Status: DC

## 2014-09-21 ENCOUNTER — Ambulatory Visit (INDEPENDENT_AMBULATORY_CARE_PROVIDER_SITE_OTHER): Payer: Self-pay | Admitting: Internal Medicine

## 2014-09-21 ENCOUNTER — Encounter: Payer: Self-pay | Admitting: Internal Medicine

## 2014-09-21 VITALS — BP 131/87 | HR 111 | Temp 98.8°F | Wt 186.0 lb

## 2014-09-21 DIAGNOSIS — F32A Depression, unspecified: Secondary | ICD-10-CM

## 2014-09-21 DIAGNOSIS — F1910 Other psychoactive substance abuse, uncomplicated: Secondary | ICD-10-CM

## 2014-09-21 DIAGNOSIS — F329 Major depressive disorder, single episode, unspecified: Secondary | ICD-10-CM

## 2014-09-21 DIAGNOSIS — B20 Human immunodeficiency virus [HIV] disease: Secondary | ICD-10-CM

## 2014-09-21 NOTE — Progress Notes (Signed)
Patient ID: Jimmy Davis, male   DOB: 1980/10/12, 34 y.o.   MRN: [id number]       Patient ID: Jimmy Davis, male   DOB: [birth year], 34 y.o.   MRN: [id number]  HPI 34yo M with HIV disease, CD 4 count of 460/VL<20 on stribild. Doing well with adherence to his hiv meds. Still active drug user with meth. Using every 3-4 days. He has not used injected in over 3 months. Is thinking about stopping but still precontemplative. Uses alone. No suicide ideation or paranoia. Currently unemployed, looking for a job.  Outpatient Encounter Prescriptions as of 09/21/2014  Medication Sig  . busPIRone (BUSPAR) 30 MG tablet Take 10 mg by mouth 3 (three) times daily.   Marland Kitchen elvitegravir-cobicistat-emtricitabine-tenofovir (STRIBILD) 150-150-200-300 MG TABS tablet Take 1 tablet by mouth daily with breakfast.  . FLUoxetine (PROZAC) 20 MG capsule Take 60 mg by mouth daily.  Marland Kitchen loperamide (LOPERAMIDE A-D) 2 MG tablet Take 2 tablets (4 mg total) by mouth 3 (three) times daily as needed for diarrhea or loose stools.  . [DISCONTINUED] elvitegravir-cobicistat-emtricitabine-tenofovir (STRIBILD) 150-150-200-300 MG TABS tablet Take 1 tablet by mouth daily with breakfast.    Up to date on immunizations  Patient Active Problem List   Diagnosis Date Noted  . Chronic diarrhea 07/27/2013  . Human immunodeficiency virus (HIV) disease 06/29/2013     Health Maintenance Due  Topic Date Due  . TETANUS/TDAP  07/02/1998     Review of Systems  Constitutional: Negative for fever, chills, diaphoresis, activity change, appetite change, fatigue and unexpected weight change.  HENT: Negative for congestion, sore throat, rhinorrhea, sneezing, trouble swallowing and sinus pressure.  Eyes: Negative for photophobia and visual disturbance.  Respiratory: Negative for cough, chest tightness, shortness of breath, wheezing and stridor.  Cardiovascular: Negative for chest pain, palpitations and leg swelling.  Gastrointestinal: Negative  for nausea, vomiting, abdominal pain, diarrhea, constipation, blood in stool, abdominal distention and anal bleeding.  Genitourinary: Negative for dysuria, hematuria, flank pain and difficulty urinating.  Musculoskeletal: Negative for myalgias, back pain, joint swelling, arthralgias and gait problem.  Skin: Negative for color change, pallor, rash and wound.  Neurological: Negative for dizziness, tremors, weakness and light-headedness.  Hematological: Negative for adenopathy. Does not bruise/bleed easily.  Psychiatric/Behavioral: positive for depression, no thought of hurting self or others   Physical Exam   BP 131/87 mmHg  Pulse 111  Temp(Src) 98.8 F (37.1 C) (Oral)  Wt 186 lb (84.369 kg) Physical Exam  Constitutional: He is oriented to person, place, and time. He appears well-developed and well-nourished. No distress.  HENT:  Mouth/Throat: Oropharynx is clear and moist. No oropharyngeal exudate.  Cardiovascular: Normal rate, regular rhythm and normal heart sounds. Exam reveals no gallop and no friction rub.  No murmur heard.  Pulmonary/Chest: Effort normal and breath sounds normal. No respiratory distress. He has no wheezes.  Abdominal: Soft. Bowel sounds are normal. He exhibits no distension. There is no tenderness.  Lymphadenopathy:  He has no cervical adenopathy.  Neurological: He is alert and oriented to person, place, and time.  Skin: Skin is warm and dry. No rash noted. No erythema.  Psychiatric: He has a normal mood and affect. His behavior is normal.    Lab Results  Component Value Date   CD4TCELL 19* 06/16/2014   Lab Results  Component Value Date   CD4TABS 460 06/16/2014   CD4TABS 540 12/07/2013   CD4TABS 460 08/17/2013   Lab Results  Component Value Date   HIV1RNAQUANT <20 06/16/2014  Lab Results  Component Value Date   HEPBSAB NEG 08/17/2013   No results found for: RPR  CBC Lab Results  Component Value Date   WBC 5.3 06/16/2014   RBC 4.79  06/16/2014   HGB 14.8 06/16/2014   HCT 42.0 06/16/2014   PLT 259 06/16/2014   MCV 87.7 06/16/2014   MCH 30.9 06/16/2014   MCHC 35.2 06/16/2014   RDW 14.1 06/16/2014   LYMPHSABS 2.5 06/16/2014   MONOABS 0.5 06/16/2014   EOSABS 0.1 06/16/2014   BASOSABS 0.0 06/16/2014   BMET Lab Results  Component Value Date   NA 138 06/16/2014   K 4.7 06/16/2014   CL 102 06/16/2014   CO2 29 06/16/2014   GLUCOSE 75 06/16/2014   BUN 17 06/16/2014   CREATININE 0.84 06/16/2014   CALCIUM 9.3 06/16/2014   GFRNONAA >89 06/16/2014   GFRAA >89 06/16/2014     Assessment and Plan  hiv disease = well controlled, continue on current regimen  Depression = continue with buspar and prozac and counseling q 3 wk  Health maintenance = will check hep c, rpr, lipids at next lab draw in 3 months  Substance abuse = offered inhouse sa counseling  rtc in 3 months

## 2014-10-03 DIAGNOSIS — F1910 Other psychoactive substance abuse, uncomplicated: Secondary | ICD-10-CM | POA: Insufficient documentation

## 2014-10-03 DIAGNOSIS — F32A Depression, unspecified: Secondary | ICD-10-CM | POA: Insufficient documentation

## 2014-10-03 DIAGNOSIS — F329 Major depressive disorder, single episode, unspecified: Secondary | ICD-10-CM | POA: Insufficient documentation

## 2014-12-07 ENCOUNTER — Inpatient Hospital Stay (HOSPITAL_COMMUNITY)
Admission: EM | Admit: 2014-12-07 | Discharge: 2014-12-09 | DRG: 975 | Disposition: A | Discharge status: Home / Self Care | Payer: Self-pay | Attending: Internal Medicine | Admitting: Internal Medicine

## 2014-12-07 ENCOUNTER — Ambulatory Visit (INDEPENDENT_AMBULATORY_CARE_PROVIDER_SITE_OTHER): Payer: Self-pay | Admitting: Internal Medicine

## 2014-12-07 ENCOUNTER — Encounter (HOSPITAL_COMMUNITY): Payer: Self-pay | Admitting: Emergency Medicine

## 2014-12-07 VITALS — BP 148/98 | HR 135 | Temp 99.0°F | Resp 17 | Ht 69.5 in | Wt 200.0 lb

## 2014-12-07 DIAGNOSIS — R000 Tachycardia, unspecified: Secondary | ICD-10-CM

## 2014-12-07 DIAGNOSIS — B20 Human immunodeficiency virus [HIV] disease: Secondary | ICD-10-CM | POA: Diagnosis present

## 2014-12-07 DIAGNOSIS — Z888 Allergy status to other drugs, medicaments and biological substances status: Secondary | ICD-10-CM

## 2014-12-07 DIAGNOSIS — M79605 Pain in left leg: Secondary | ICD-10-CM

## 2014-12-07 DIAGNOSIS — R0782 Intercostal pain: Secondary | ICD-10-CM

## 2014-12-07 DIAGNOSIS — L03116 Cellulitis of left lower limb: Secondary | ICD-10-CM

## 2014-12-07 DIAGNOSIS — F1910 Other psychoactive substance abuse, uncomplicated: Secondary | ICD-10-CM

## 2014-12-07 DIAGNOSIS — M79602 Pain in left arm: Secondary | ICD-10-CM

## 2014-12-07 DIAGNOSIS — Z7189 Other specified counseling: Secondary | ICD-10-CM | POA: Diagnosis present

## 2014-12-07 DIAGNOSIS — L0390 Cellulitis, unspecified: Secondary | ICD-10-CM | POA: Diagnosis present

## 2014-12-07 DIAGNOSIS — A419 Sepsis, unspecified organism: Principal | ICD-10-CM | POA: Diagnosis present

## 2014-12-07 DIAGNOSIS — F419 Anxiety disorder, unspecified: Secondary | ICD-10-CM | POA: Diagnosis present

## 2014-12-07 DIAGNOSIS — F329 Major depressive disorder, single episode, unspecified: Secondary | ICD-10-CM | POA: Diagnosis present

## 2014-12-07 LAB — POCT URINALYSIS DIPSTICK
Bilirubin, UA: NEGATIVE
Glucose, UA: NEGATIVE
Ketones, UA: NEGATIVE
Leukocytes, UA: NEGATIVE
NITRITE UA: NEGATIVE
PROTEIN UA: NEGATIVE
SPEC GRAV UA: 1.02
Urobilinogen, UA: 0.2
pH, UA: 6.5

## 2014-12-07 LAB — POCT CBC
Granulocyte percent: 77 %G (ref 37–80)
HCT, POC: 41.3 % — AB (ref 43.5–53.7)
Hemoglobin: 14 g/dL — AB (ref 14.1–18.1)
LYMPH, POC: 3 (ref 0.6–3.4)
MCH, POC: 30.3 pg (ref 27–31.2)
MCHC: 33.8 g/dL (ref 31.8–35.4)
MCV: 89.7 fL (ref 80–97)
MPV: 6.1 fL (ref 0–99.8)
POC GRANULOCYTE: 10.2 — AB (ref 2–6.9)
POC LYMPH PERCENT: 22.7 %L (ref 10–50)
POC MID %: 0.3 %M (ref 0–12)
Platelet Count, POC: 300 10*3/uL (ref 142–424)
RBC: 4.6 M/uL — AB (ref 4.69–6.13)
RDW, POC: 13.5 %
WBC: 13.2 10*3/uL — AB (ref 4.6–10.2)

## 2014-12-07 LAB — COMPREHENSIVE METABOLIC PANEL
ALBUMIN: 4.7 g/dL (ref 3.5–5.2)
ALT: 44 U/L (ref 0–53)
AST: 29 U/L (ref 0–37)
Alkaline Phosphatase: 109 U/L (ref 39–117)
BILIRUBIN TOTAL: 0.7 mg/dL (ref 0.2–1.2)
BUN: 13 mg/dL (ref 6–23)
CALCIUM: 9.2 mg/dL (ref 8.4–10.5)
CHLORIDE: 99 meq/L (ref 96–112)
CO2: 26 mEq/L (ref 19–32)
CREATININE: 1.01 mg/dL (ref 0.50–1.35)
GLUCOSE: 88 mg/dL (ref 70–99)
POTASSIUM: 3.8 meq/L (ref 3.5–5.3)
Sodium: 137 mEq/L (ref 135–145)
Total Protein: 7.5 g/dL (ref 6.0–8.3)

## 2014-12-07 LAB — CBC
HEMATOCRIT: 38.2 % — AB (ref 39.0–52.0)
Hemoglobin: 13.3 g/dL (ref 13.0–17.0)
MCH: 30.9 pg (ref 26.0–34.0)
MCHC: 34.8 g/dL (ref 30.0–36.0)
MCV: 88.6 fL (ref 78.0–100.0)
Platelets: 225 10*3/uL (ref 150–400)
RBC: 4.31 MIL/uL (ref 4.22–5.81)
RDW: 12.8 % (ref 11.5–15.5)
WBC: 11.6 10*3/uL — AB (ref 4.0–10.5)

## 2014-12-07 LAB — CREATININE, SERUM
CREATININE: 0.89 mg/dL (ref 0.61–1.24)
GFR calc non Af Amer: >60 mL/min (ref >60)

## 2014-12-07 LAB — POCT UA - MICROSCOPIC ONLY
Bacteria, U Microscopic: NEGATIVE
CASTS, UR, LPF, POC: NEGATIVE
Crystals, Ur, HPF, POC: NEGATIVE
Epithelial cells, urine per micros: NEGATIVE
YEAST UA: NEGATIVE

## 2014-12-07 LAB — I-STAT CHEM 8, ED
BUN: 12 mg/dL (ref 6–20)
CALCIUM ION: 1.15 mmol/L (ref 1.12–1.23)
Chloride: 101 mmol/L (ref 101–111)
Creatinine, Ser: 1 mg/dL (ref 0.61–1.24)
GLUCOSE: 89 mg/dL (ref 65–99)
HCT: 42 % (ref 39.0–52.0)
HEMOGLOBIN: 14.3 g/dL (ref 13.0–17.0)
POTASSIUM: 3.8 mmol/L (ref 3.5–5.1)
SODIUM: 137 mmol/L (ref 135–145)
TCO2: 23 mmol/L (ref 0–100)

## 2014-12-07 LAB — LIPASE: Lipase: 10 U/L (ref 0–75)

## 2014-12-07 LAB — I-STAT CG4 LACTIC ACID, ED: Lactic Acid, Venous: 0.42 mmol/L — ABNORMAL LOW (ref 0.5–2.0)

## 2014-12-07 LAB — TSH
TSH: 3.289 u[IU]/mL (ref 0.350–4.500)
TSH: 2.76 u[IU]/mL (ref 0.350–4.500)

## 2014-12-07 LAB — GLUCOSE, POCT (MANUAL RESULT ENTRY): POC Glucose: 94 mg/dl (ref 70–99)

## 2014-12-07 MED ORDER — POLYVINYL ALCOHOL 1.4 % OP SOLN
1.0000 [drp] | OPHTHALMIC | Status: DC | PRN
  Administered 2014-12-07: 1 [drp] via OPHTHALMIC
  Filled 2014-12-07: qty 15

## 2014-12-07 MED ORDER — DM-GUAIFENESIN ER 30-600 MG PO TB12
1.0000 | ORAL_TABLET | Freq: Two times a day (BID) | ORAL | Status: DC
  Administered 2014-12-07 – 2014-12-09 (×4): 1 via ORAL
  Filled 2014-12-07 (×5): qty 1

## 2014-12-07 MED ORDER — FLUOXETINE HCL 20 MG PO CAPS
60.0000 mg | ORAL_CAPSULE | Freq: Every day | ORAL | Status: DC
  Administered 2014-12-07 – 2014-12-09 (×3): 60 mg via ORAL
  Filled 2014-12-07 (×3): qty 3

## 2014-12-07 MED ORDER — ONDANSETRON HCL 4 MG/2ML IJ SOLN: 4.0000 mg | Freq: Four times a day (QID) | INTRAMUSCULAR | Status: DC | PRN

## 2014-12-07 MED ORDER — VANCOMYCIN HCL 10 G IV SOLR
1500.0000 mg | Freq: Once | INTRAVENOUS | Status: AC
  Administered 2014-12-07: 1500 mg via INTRAVENOUS
  Filled 2014-12-07: qty 1500

## 2014-12-07 MED ORDER — ACETAMINOPHEN 650 MG RE SUPP: 650.0000 mg | Freq: Four times a day (QID) | RECTAL | Status: DC | PRN

## 2014-12-07 MED ORDER — SODIUM CHLORIDE 0.9 % IV SOLN
INTRAVENOUS | Status: DC
  Administered 2014-12-07 – 2014-12-09 (×3): via INTRAVENOUS

## 2014-12-07 MED ORDER — ONDANSETRON HCL 4 MG PO TABS: 4.0000 mg | ORAL_TABLET | Freq: Four times a day (QID) | ORAL | Status: DC | PRN

## 2014-12-07 MED ORDER — SODIUM CHLORIDE 0.9 % IJ SOLN: 3.0000 mL | Freq: Two times a day (BID) | INTRAMUSCULAR | Status: DC

## 2014-12-07 MED ORDER — HEPARIN SODIUM (PORCINE) 5000 UNIT/ML IJ SOLN
5000.0000 [IU] | Freq: Three times a day (TID) | INTRAMUSCULAR | Status: DC
  Administered 2014-12-07 – 2014-12-09 (×5): 5000 [IU] via SUBCUTANEOUS
  Filled 2014-12-07 (×8): qty 1

## 2014-12-07 MED ORDER — ADULT MULTIVITAMIN W/MINERALS CH
1.0000 | ORAL_TABLET | Freq: Every day | ORAL | Status: DC
  Administered 2014-12-07 – 2014-12-09 (×3): 1 via ORAL
  Filled 2014-12-07 (×3): qty 1

## 2014-12-07 MED ORDER — HYDROMORPHONE HCL 1 MG/ML IJ SOLN: 1.0000 mg | INTRAMUSCULAR | Status: DC | PRN

## 2014-12-07 MED ORDER — LORATADINE 10 MG PO TABS
10.0000 mg | ORAL_TABLET | Freq: Every day | ORAL | Status: DC
  Administered 2014-12-07 – 2014-12-09 (×3): 10 mg via ORAL
  Filled 2014-12-07 (×3): qty 1

## 2014-12-07 MED ORDER — ELVITEG-COBIC-EMTRICIT-TENOFAF 150-150-200-10 MG PO TABS
1.0000 | ORAL_TABLET | Freq: Every day | ORAL | Status: DC
  Administered 2014-12-07 – 2014-12-09 (×3): 1 via ORAL
  Filled 2014-12-07 (×4): qty 1

## 2014-12-07 MED ORDER — CEFTRIAXONE SODIUM 1 G IJ SOLR
1.0000 g | Freq: Once | INTRAMUSCULAR | Status: AC
  Administered 2014-12-07: 1 g via INTRAMUSCULAR

## 2014-12-07 MED ORDER — GUAIFENESIN-DM 100-10 MG/5ML PO SYRP: 5.0000 mL | ORAL_SOLUTION | ORAL | Status: DC | PRN

## 2014-12-07 MED ORDER — HYDROCODONE-ACETAMINOPHEN 5-325 MG PO TABS: 1.0000 | ORAL_TABLET | ORAL | Status: DC | PRN

## 2014-12-07 MED ORDER — ALUM & MAG HYDROXIDE-SIMETH 200-200-20 MG/5ML PO SUSP: 30.0000 mL | Freq: Four times a day (QID) | ORAL | Status: DC | PRN

## 2014-12-07 MED ORDER — VANCOMYCIN HCL IN DEXTROSE 1-5 GM/200ML-% IV SOLN
1000.0000 mg | Freq: Three times a day (TID) | INTRAVENOUS | Status: DC
  Administered 2014-12-08 – 2014-12-09 (×5): 1000 mg via INTRAVENOUS
  Filled 2014-12-07 (×6): qty 200

## 2014-12-07 MED ORDER — SODIUM CHLORIDE 0.9 % IV BOLUS (SEPSIS)
1000.0000 mL | Freq: Once | INTRAVENOUS | Status: AC
  Administered 2014-12-07: 1000 mL via INTRAVENOUS

## 2014-12-07 MED ORDER — ACETAMINOPHEN 325 MG PO TABS: 650.0000 mg | ORAL_TABLET | Freq: Four times a day (QID) | ORAL | Status: DC | PRN

## 2014-12-07 MED ORDER — DOXYCYCLINE HYCLATE 100 MG PO TABS: 100.0000 mg | ORAL_TABLET | Freq: Once | ORAL | Status: DC

## 2014-12-07 NOTE — ED Notes (Addendum)
Admitting doctor is at bedside, will get labs after, per admitting doctor.

## 2014-12-07 NOTE — ED Provider Notes (Signed)
CSN: [id number]     Arrival date & time 12/07/14  1503 History   First MD Initiated Contact with Patient 12/07/14 1536     Chief Complaint  Patient presents with  . Rash     (Consider location/radiation/quality/duration/timing/severity/associated sxs/prior Treatment) HPI  Jimmy Davis is a 34 y.o. male with PMH of HIV (last CD4 460 in Dec), substance abuse, presenting today with LLE cellulitis.  He states this began approx 12 hrs ago.  He is concerned how rapidly the rash is spreading.  He admits to using meth through smoking and smoking 3-4x per week.  He denies IV drug use. Patient was seen at urgent care and sent to the emergency department for further evaluation and possible admission. There he was given a gram of Rocephin intramuscular as well as IV fluids.  He denies fevers or recent infections/antibiotic use.  His only complaint is severe pain in the leg.    10 Systems reviewed and are negative for acute change except as noted in the HPI.    Past Medical History  Diagnosis Date  . Anxiety   . Depression   . Substance abuse   . Human immunodeficiency virus (HIV) disease    Past Surgical History  Procedure Laterality Date  . Wisdom tooth extraction     Family History  Problem Relation Age of Onset  . Heart disease Father   . Mental illness Maternal Grandmother    History  Substance Use Topics  . Smoking status: Never Smoker   . Smokeless tobacco: Never Used  . Alcohol Use: No    Review of Systems    Allergies  Review of patient's allergies indicates no known allergies.  Home Medications   Prior to Admission medications   Medication Sig Start Date End Date Taking? Authorizing Provider  CALCIUM-MAGNESIUM-ZINC PO Take 3 tablets by mouth 3 (three) times a week.   Yes Historical Provider, MD  elvitegravir-cobicistat-emtricitabine-tenofovir (STRIBILD) 150-150-200-300 MG TABS tablet Take 1 tablet by mouth daily with breakfast. 07/11/14  Yes Judyann Munsonynthia Snider, MD   FLUoxetine (PROZAC) 20 MG capsule Take 60 mg by mouth daily.   Yes Historical Provider, MD  L-ARGININE PO Take 1 tablet by mouth 3 (three) times a week.   Yes Historical Provider, MD  L-TYROSINE PO Take 1 tablet by mouth 3 (three) times a week.   Yes Historical Provider, MD  loratadine (CLARITIN) 10 MG tablet Take 10 mg by mouth daily as needed for allergies.   Yes Historical Provider, MD  Multiple Vitamin (MULTIVITAMIN) capsule Take 1 capsule by mouth daily.   Yes Historical Provider, MD  POTASSIUM PO Take 1 capsule by mouth 2 (two) times daily as needed (potassium low).   Yes Historical Provider, MD   BP 142/86 mmHg  Pulse 126  Temp(Src) 98.4 F (36.9 C) (Oral)  Resp 20  SpO2 100% Physical Exam  Constitutional: He is oriented to person, place, and time. Vital signs are normal. He appears well-developed and well-nourished.  Non-toxic appearance. He does not appear ill. No distress.  HENT:  Head: Normocephalic and atraumatic.  Nose: Nose normal.  Mouth/Throat: Oropharynx is clear and moist. No oropharyngeal exudate.  Eyes: Conjunctivae and EOM are normal. Pupils are equal, round, and reactive to light. No scleral icterus.  Neck: Normal range of motion. Neck supple. No tracheal deviation, no edema, no erythema and normal range of motion present. No thyroid mass and no thyromegaly present.  Cardiovascular: Regular rhythm, S1 normal, S2 normal, normal heart sounds, intact distal pulses  and normal pulses.  Exam reveals no gallop and no friction rub.   No murmur heard. Pulses:      Radial pulses are 2+ on the right side, and 2+ on the left side.       Dorsalis pedis pulses are 2+ on the right side, and 2+ on the left side.  Tachycardic  Pulmonary/Chest: Effort normal and breath sounds normal. No respiratory distress. He has no wheezes. He has no rhonchi. He has no rales.  Abdominal: Soft. Normal appearance and bowel sounds are normal. He exhibits no distension, no ascites and no mass. There  is no hepatosplenomegaly. There is no tenderness. There is no rebound, no guarding and no CVA tenderness.  Musculoskeletal: Normal range of motion. He exhibits no edema or tenderness.  Lymphadenopathy:    He has no cervical adenopathy.  Neurological: He is alert and oriented to person, place, and time. He has normal strength. No cranial nerve deficit or sensory deficit. He exhibits normal muscle tone.  Skin: Skin is warm, dry and intact. Rash noted. No petechiae noted. He is not diaphoretic. There is erythema. No pallor.  Erythema to the left lower extremity from the ankle to the knee. There is warmth and tenderness to palpation.  Nursing note and vitals reviewed.   ED Course  Procedures (including critical care time) Labs Review Labs Reviewed  I-STAT CG4 LACTIC ACID, ED  I-STAT CHEM 8, ED    Imaging Review No results found.   EKG Interpretation None      MDM   Final diagnoses:  None   Patient presents emergency department for cellulitis of the left lower extremity. He was seen at urgent care, given a gram of Rocephin intramuscular and IV fluids and since emergency department for evaluation. Patient will require admission for infection due to his heart rate of 135, rapid spreading of the cellulitis, and elevated white count. Blood cultures were drawn, patient was given vancomycin for treatment. Lactate is pending.   He was ordered a second liter of IV fluids. He'll be admitted for continued management.  Patient meets sepsis criteria.    Tomasita Crumble, MD 12/07/14 [phone]

## 2014-12-07 NOTE — H&P (Signed)
History and Physical        Hospital Admission Note Date: 12/07/2014  Patient name: Jimmy Davis Medical record number: [id number] Date of birth: 1981-02-20 Age: 34 y.o. Gender: male  PCP: Norberto SorensonSHAW,EVA, MD  Referring physician: Dr Mora Bellmanni  Chief Complaint:  Left lower extremity pain with redness since last night  HPI: Patient is a 34 year old male with HIV, anxiety, depression, substance abuse presented from the urgent care with left lower extremity cellulitis. Patient reported that he was sitting in a barn on Sunday but doesnot remember any insect bite. He denies any IV drug use. Last night he noticed pain in his left leg on walking and this morning, he woke up with redness on his left lower leg. Patient went to the urgent care and was sent to the ER for further evaluation and admission. Patient was given 1 g of Rocephin. He admitted to subjective fevers and chills.  Review of Systems:  Constitutional: Denies fever, chills, diaphoresis, poor appetite and fatigue.  HEENT: Denies photophobia, eye pain, redness, hearing loss, ear pain, congestion, sore throat, rhinorrhea, sneezing, mouth sores, trouble swallowing, neck pain, neck stiffness and tinnitus.   Respiratory: Denies SOB, DOE, cough, chest tightness,  and wheezing.   Cardiovascular: Denies chest pain, palpitations and leg swelling.  Gastrointestinal: Denies nausea, vomiting, abdominal pain, diarrhea, constipation, blood in stool and abdominal distention.  Genitourinary: Denies dysuria, urgency, frequency, hematuria, flank pain and difficulty urinating.  Musculoskeletal: Denies myalgias, back pain, joint swelling, arthralgias and gait problem.  Skin: Redness and pain in his left leg Neurological: Denies dizziness, seizures, syncope, weakness, light-headedness, numbness and headaches.  Hematological: Denies adenopathy. Easy  bruising, personal or family bleeding history  Psychiatric/Behavioral: Denies suicidal ideation, mood changes, confusion, nervousness, sleep disturbance and agitation  Past Medical History: Past Medical History  Diagnosis Date  . Anxiety   . Depression   . Substance abuse   . Human immunodeficiency virus (HIV) disease     Past Surgical History  Procedure Laterality Date  . Wisdom tooth extraction      Medications: Prior to Admission medications   Medication Sig Start Date End Date Taking? Authorizing Provider  CALCIUM-MAGNESIUM-ZINC PO Take 3 tablets by mouth 3 (three) times a week.   Yes Historical Provider, MD  elvitegravir-cobicistat-emtricitabine-tenofovir (STRIBILD) 150-150-200-300 MG TABS tablet Take 1 tablet by mouth daily with breakfast. 07/11/14  Yes Judyann Munsonynthia Snider, MD  FLUoxetine (PROZAC) 20 MG capsule Take 60 mg by mouth daily.   Yes Historical Provider, MD  L-ARGININE PO Take 1 tablet by mouth 3 (three) times a week.   Yes Historical Provider, MD  L-TYROSINE PO Take 1 tablet by mouth 3 (three) times a week.   Yes Historical Provider, MD  loratadine (CLARITIN) 10 MG tablet Take 10 mg by mouth daily as needed for allergies.   Yes Historical Provider, MD  Multiple Vitamin (MULTIVITAMIN) capsule Take 1 capsule by mouth daily.   Yes Historical Provider, MD  POTASSIUM PO Take 1 capsule by mouth 2 (two) times daily as needed (potassium low).   Yes Historical Provider, MD    Allergies:  No Known Allergies  Social History:  reports that he has never smoked. He has  never used smokeless tobacco. He reports that he uses illicit drugs (Methamphetamines) about once per week. He reports that he does not drink alcohol.  Family History: Reviewed with the patient Family History  Problem Relation Age of Onset  . Heart disease Father   . Mental illness Maternal Grandmother     Physical Exam: Blood pressure 142/86, pulse 126, temperature 98.4 F (36.9 C), temperature source Oral,  resp. rate 20, SpO2 100 %. General: Alert, awake, oriented x3, in no acute distress. HEENT: normocephalic, atraumatic, anicteric sclera, pink conjunctiva, pupils equal and reactive to light and accomodation, oropharynx clear Neck: supple, no masses or lymphadenopathy, no goiter, no bruits  Heart: Regular rate and rhythm, without murmurs, rubs or gallops. Lungs: Clear to auscultation bilaterally, no wheezing, rales or rhonchi. Abdomen: Soft, nontender, nondistended, positive bowel sounds, no masses. Extremities: No clubbing, cyanosis or edema with positive pedal pulses. Neuro: Grossly intact, no focal neurological deficits, strength 5/5 upper and lower extremities bilaterally Psych: alert and oriented x 3, normal mood and affect Skin: Left lower extremity erythema, tenderness from ankle to the knee, mild swelling    LABS on Admission:  Basic Metabolic Panel: No results for input(s): NA, K, CL, CO2, GLUCOSE, BUN, CREATININE, CALCIUM, MG, PHOS in the last 168 hours. Liver Function Tests: No results for input(s): AST, ALT, ALKPHOS, BILITOT, PROT, ALBUMIN in the last 168 hours. No results for input(s): LIPASE, AMYLASE in the last 168 hours. No results for input(s): AMMONIA in the last 168 hours. CBC:  Recent Labs Lab 12/07/14 1409  WBC 13.2*  HGB 14.0*  HCT 41.3*  MCV 89.7   Cardiac Enzymes: No results for input(s): CKTOTAL, CKMB, CKMBINDEX, TROPONINI in the last 168 hours. BNP: Invalid input(s): POCBNP CBG: No results for input(s): GLUCAP in the last 168 hours.  Radiological Exams on Admission:  No results found.  *I have personally reviewed the images above*    Assessment/Plan Principal Problem:  Left lower extremity Cellulitis, SIRS : Patient meets SIRS criteria due to leukocytosis, tachycardia, low-grade fever, source being the left lower extremities cellulitis - Obtain lower extremity venous Dopplers to rule out DVT - Placed on IV vancomycin, check blood  cultures  Active Problems:   Human immunodeficiency virus (HIV) disease - will follow CD4 count, continue stribild    Substance abuse - Patient counseled on substance abuse    Allergies - Continue Claritin  Anxiety and depression Continue fluoxetine  DVT prophylaxis: heparin  CODE STATUS:   Family Communication: Admission, patients condition and plan of care including tests being ordered have been discussed with the patient and mother who indicates understanding and agree with the plan and Code Status  Disposition plan: Further plan will depend as patient's clinical course evolves and further radiologic and laboratory data become available.   Time Spent on Admission: 60 mins   RAI,RIPUDEEP M.D. [facility] 12/07/2014, 4:32 PM Pager: [phone]  If 7PM-7AM, please contact night-coverage [email or web address] Password [email or web address]

## 2014-12-07 NOTE — Progress Notes (Signed)
34 yr old [place]  Pt with sepsis, cellulitis [facility] urgent [facility] PMH HIV Pt confirmed with CM he is being seen mainly at the [facility] for most medical and medication services  CM discussed and provided written information for self pay pcps, discussed the importance of pcp vs EDP services for f/u care, www.needymeds.org, www.goodrx.com, discounted pharmacies and other [place] resources such as [facility] , [facility], affordable care act,  [place] med assist, financial assistance, self pay dental services, [place] med assist, DSS and  health department  Reviewed resources for [place] self pay pcps like [facility], [facility] medicine [facility] [address], [facility], [facility], local urgent care centers, [facility], [facility], general medical clinics, [facility] services of the [facility], [facility], [facility] plus others, medication resources, [facility] out patient pharmacies and housing Pt voiced understanding and appreciation of resources provided   Pt noted to be in constant motion indicating discomfor during CM assessment.  Male at bedside on cell phone. Pt informed CM he was "okay" when Cm inquired

## 2014-12-07 NOTE — Patient Instructions (Signed)
Stimulant Use Disorder-Methamphetamines Methamphetamines are one of a group of powerful drugs known as stimulants. Methamphetamine is a form of amphetamine. It is rarely used medically but is often misused because of the effects it produces. These effects include:  A feeling of extreme pleasure (euphoria).  Alertness.  High energy.  Increased sexuality. Common street names for methamphetamine are meth, crystal, ice, glass, and chalk. This drug can be taken by mouth, smoked, snorted, or dissolved in water and injected. Stimulants are addictive because they activate regions of the brain that are responsible for producing both the pleasurable sensation of "reward" and psychological dependence. Together, these actions account for loss of control and the rapid development of drug dependence. This means the user will become ill without the drug (withdrawal) and will need to keep using it to function.  Stimulant use disorder is use of stimulants that disrupts your daily life. It disrupts relationships with family and friends and how you do your job. Methamphetamine increases blood pressure and heart rate. Use can cause a heart attack or stroke. Methamphetamine can also cause death from irregular heart rate or seizures. SIGNS AND SYMPTOMS  Signs and symptoms of stimulant use disorder with methamphetamine include:  Use of methamphetamines in larger amounts or over a longer period than intended.  Unsuccessful attempts to cut down or control methamphetamine use.  A lot of time spent obtaining, using, or recovering from the effects of methamphetamines.  A strong desire or urge to use methamphetamines (craving).  Continued use of methamphetamines in spite of major problems at work, school, or home because of use.  Continued use of methamphetamines in spite of relationship problems because of use.  Giving up or cutting down on important life activities because of use of methamphetamines.  Use of  methamphetamines over and over in situations when it is physically hazardous, such as driving a car.  Continued use of methamphetamines in spite of a physical problem that is likely related to use. Physical problems can include:  Extreme weight loss.  Malnutrition.  Jaw clenching.  Severe dental problems (meth mouth).  Lung problems (due to smoking).  Skin sores (due to scratching).  Infections such as human immunodeficiency virus (HIV) and hepatitis (from injecting methamphetamines).  Continued use of methamphetamines in spite of a mental problem that is likely related to use. Mental problems can include:  Memory problems.  Schizophrenia-like symptoms.  Depression.  Bipolar mood swings.  Violent behavior.  Anxiety.  Sleep problems.  Need to use more and more methamphetamines to get the same effect, or lessened effect over time with use of the same amount (tolerance).  Having withdrawal symptoms when methamphetamine use is stopped, or using methamphetamines to reduce or avoid withdrawal symptoms. Withdrawal symptoms include:  Depressed or irritable mood.  Low energy or restlessness.  Bad dreams.  Too little or too much sleep.  Increased appetite. DIAGNOSIS Stimulant use disorder is diagnosed by your health care provider. You may be asked questions about your methamphetamine use and how it affects your life. A physical exam may be done. A drug screen may be ordered. You may be referred to a mental health professional. The diagnosis of stimulant use disorder requires at least two symptoms within 12 months. The type of stimulant use disorder depends on the number of symptoms you have. The type may be:  Mild. Two or three signs and symptoms.  Moderate. Four or five signs and symptoms.  Severe. Six or more signs and symptoms. TREATMENT  There are two  types of treatment:   Short-term (urgent) medical treatment. This helps to preserve life and prevent or minimize  damage from the physical or mental problems related to methamphetamine use.   Long-term substance abuse treatment. This focuses on recovery from use disorder. It is provided by mental health professionals who have training in substance use disorders. It is usually a combination of counseling, support groups, and nonaddictive medicines that can reduce cravings or block the effects of methamphetamine. HOME CARE INSTRUCTIONS   Take medicines only as directed by your health care provider.  Identify the people and activities that trigger your methamphetamine use and avoid them.  Keep all follow-up visits as directed by your health care provider. SEEK MEDICAL CARE IF:  Your symptoms get worse or you relapse.  You are not able to take medicines as directed.  SEEK IMMEDIATE MEDICAL CARE IF:   You have serious thoughts about hurting yourself or others.  You have a seizure, chest pain, sudden weakness, or loss of speech or vision. FOR MORE INFORMATION  National Institute on Drug Abuse: http://www.price-smith.com/www.drugabuse.gov  Substance Abuse and Mental Health Services Administration: SkateOasis.com.ptwww.samhsa.gov Document Released: 02/28/2004 Document Revised: 11/08/2013 Document Reviewed: 07/07/2013 [facility] Patient Information 2015 [place], [place]. This information is not intended to replace advice given to you by your health care provider. Make sure you discuss any questions you have with your health care provider. Cellulitis Cellulitis is an infection of the skin and the tissue beneath it. The infected area is usually red and tender. Cellulitis occurs most often in the arms and lower legs.  CAUSES  Cellulitis is caused by bacteria that enter the skin through cracks or cuts in the skin. The most common types of bacteria that cause cellulitis are staphylococci and streptococci. SIGNS AND SYMPTOMS   Redness and warmth.  Swelling.  Tenderness or pain.  Fever. DIAGNOSIS  Your health care provider can usually determine  what is wrong based on a physical exam. Blood tests may also be done. TREATMENT  Treatment usually involves taking an antibiotic medicine. HOME CARE INSTRUCTIONS   Take your antibiotic medicine as directed by your health care provider. Finish the antibiotic even if you start to feel better.  Keep the infected arm or leg elevated to reduce swelling.  Apply a warm cloth to the affected area up to 4 times per day to relieve pain.  Take medicines only as directed by your health care provider.  Keep all follow-up visits as directed by your health care provider. SEEK MEDICAL CARE IF:   You notice red streaks coming from the infected area.  Your red area gets larger or turns dark in color.  Your bone or joint underneath the infected area becomes painful after the skin has healed.  Your infection returns in the same area or another area.  You notice a swollen bump in the infected area.  You develop new symptoms.  You have a fever. SEEK IMMEDIATE MEDICAL CARE IF:   You feel very sleepy.  You develop vomiting or diarrhea.  You have a general ill feeling (malaise) with muscle aches and pains. MAKE SURE YOU:   Understand these instructions.  Will watch your condition.  Will get help right away if you are not doing well or get worse. Document Released: 04/03/2005 Document Revised: 11/08/2013 Document Reviewed: 09/09/2011 [facility] Patient Information 2015 [place], [place]. This information is not intended to replace advice given to you by your health care provider. Make sure you discuss any questions you have with your health  care provider.  

## 2014-12-07 NOTE — ED Notes (Signed)
[place].  Pt has rash like appearance on left leg that started last night causes pain when he puts weight on it.  Pt admits to using 1/2 gram Meth today, uses about 3-4 times per week.  Pt was given Rocephin in office.  Pt has 20 g in left AC.

## 2014-12-07 NOTE — Progress Notes (Deleted)
   Subjective:    Patient ID: Jimmy Davis, male    DOB: [birth year], 34 y.o.   MRN: [id number]  HPI   Review of Systems     Objective:   Physical Exam        Assessment & Plan:

## 2014-12-07 NOTE — Progress Notes (Signed)
ANTIBIOTIC CONSULT NOTE - INITIAL  Pharmacy Consult for Vancomycin Indication: Cellulitis  No Known Allergies  Patient Measurements:   Adjusted Body Weight:   Vital Signs: Temp: 98.4 F (36.9 C) (06/01 1506) Temp Source: Oral (06/01 1506) BP: 141/85 mmHg (06/01 1707) Pulse Rate: 111 (06/01 1707) Intake/Output from previous day:   Intake/Output from this shift:    Labs:  Recent Labs  12/07/14 1409 12/07/14 1636  WBC 13.2*  --   HGB 14.0* 14.3  CREATININE  --  1.00   Estimated Creatinine Clearance: 115.8 mL/min (by C-G formula based on Cr of 1). No results for input(s): VANCOTROUGH, VANCOPEAK, VANCORANDOM, GENTTROUGH, GENTPEAK, GENTRANDOM, TOBRATROUGH, TOBRAPEAK, TOBRARND, AMIKACINPEAK, AMIKACINTROU, AMIKACIN in the last 72 hours.   Microbiology: No results found for this or any previous visit (from the past 720 hour(s)).  Medical History: Past Medical History  Diagnosis Date  . Anxiety   . Depression   . Substance abuse   . Human immunodeficiency virus (HIV) disease    Assessment: [age] yoM with PMHx depression, substance abuse, and HIV (last CD4 460 in Dec) presents from urgent care with SIRS and LLE pain and redness rapidly spreading since last night. Denies IV drug use. Received one dose of ceftriaxone IM.  Pharmacy consulted to start Vancomycin. 1st dose given in ED.  NKDA.   Antibiotics: 6/1 >> Ceftriaxone x 1 >> 6/1 6/1 >> Vancomycin >>  Labs/Vitals: Lactic Acid: 0.42 WBC slightly elevated 13.2 SCr 1.00, CrCl >100  Microbiology: 6/1: Bld cxs 2/2: collected 6/1: Urine: ordered   Goal of Therapy:  Vancomycin trough level 10-15 mcg/ml  Plan:  Vancomycin 1g IV q8h  F/u renal fxn, cultures, clinical course  Haynes Hoehnolleen Khalid Lacko, PharmD, BCPS 12/07/2014, 6:19 PM  Pager: [phone]

## 2014-12-07 NOTE — Progress Notes (Signed)
Subjective:    Patient ID: Jimmy Davis, male    DOB: 1981/04/12, 34 y.o.   MRN: [id number]  HPI I, Jimmy MootsMichelle Davis R.T. (R), am scribing for Jimmy Davis Jimmy Davis M.D.  Jimmy HaJonathan is an HIV positive male who now states it is undetectable. He comes in today with left lower leg and abdominal pain starting 12 hours ago. He also c/o fever and chills, with some SOB, shaking and congestion. He c/o numbness in his right foot and both hands. He also admits to Meth use through smoking and snorting it. He denies injecting Meth into his legs but has used in the past hour before his visit today. He states he is seeing a counselor for his drug use. He also denies knee and foot pain,dysuria, bowel issues, nausea, or vomiting. He last ate around midnight and denies pancreatitis.     Review of Systems  Constitutional: Positive for fever, chills, diaphoresis and fatigue.  HENT: Positive for congestion and mouth sores.   Eyes: Negative.   Respiratory: Positive for shortness of breath.   Cardiovascular: Negative.   Gastrointestinal: Positive for abdominal pain.  Endocrine: Negative.   Genitourinary: Positive for flank pain.  Musculoskeletal: Negative.   Skin: Positive for rash.  Allergic/Immunologic: Negative.   Neurological: Positive for numbness.  Hematological: Negative.   Psychiatric/Behavioral: Positive for suicidal ideas.       Objective:   Physical Exam  Constitutional: He appears well-developed and well-nourished. He appears distressed.  HENT:  Head: Normocephalic.  Eyes: EOM are normal. Pupils are equal, round, and reactive to light. No scleral icterus.  Neck: Normal range of motion. Neck supple.  Cardiovascular: Regular rhythm.  Tachycardia present.  Exam reveals gallop and distant heart sounds.   No murmur heard. Lymphadenopathy:    He has no cervical adenopathy.  Vitals reviewed.  Results for orders placed or performed in visit on 12/07/14  POCT CBC  Result Value Ref Range   WBC 13.2 (A) 4.6 - 10.2 K/uL   Lymph, poc 3.0 0.6 - 3.4   POC LYMPH PERCENT 22.7 10 - 50 %L   MID (cbc)  0 - 0.9   POC MID % 0.3 0 - 12 %M   POC Granulocyte 10.2 (A) 2 - 6.9   Granulocyte percent 77.0 37 - 80 %G   RBC 4.60 (A) 4.69 - 6.13 M/uL   Hemoglobin 14.0 (A) 14.1 - 18.1 g/dL   HCT, POC 40.941.3 (A) 81.143.5 - 53.7 %   MCV 89.7 80 - 97 fL   MCH, POC 30.3 27 - 31.2 pg   MCHC 33.8 31.8 - 35.4 g/dL   RDW, POC 91.413.5 %   Platelet Count, POC 300 142 - 424 K/uL   MPV 6.1 0 - 99.8 fL  POCT glucose (manual entry)  Result Value Ref Range   POC Glucose 94 70 - 99 mg/dl  POCT urinalysis dipstick  Result Value Ref Range   Color, UA yellow    Clarity, UA clear    Glucose, UA negative    Bilirubin, UA negative    Ketones, UA negative    Spec Grav, UA 1.020    Blood, UA trace-intact    pH, UA 6.5    Protein, UA negative    Urobilinogen, UA 0.2    Nitrite, UA negative    Leukocytes, UA Negative           Assessment & Plan:  Cellulitis leg Active Meth abuse Chest pain tachycardia/Possible dehydration and meth To ER for IV  antibiotics/dtox observe

## 2014-12-08 ENCOUNTER — Inpatient Hospital Stay (HOSPITAL_COMMUNITY): Payer: Self-pay

## 2014-12-08 DIAGNOSIS — M7989 Other specified soft tissue disorders: Secondary | ICD-10-CM

## 2014-12-08 DIAGNOSIS — M79609 Pain in unspecified limb: Secondary | ICD-10-CM

## 2014-12-08 LAB — BASIC METABOLIC PANEL
Anion gap: 7 (ref 5–15)
BUN: 13 mg/dL (ref 6–20)
CALCIUM: 8.8 mg/dL — AB (ref 8.9–10.3)
CHLORIDE: 107 mmol/L (ref 101–111)
CO2: 25 mmol/L (ref 22–32)
Creatinine, Ser: 0.96 mg/dL (ref 0.61–1.24)
GFR calc Af Amer: >60 mL/min (ref >60)
GFR calc non Af Amer: >60 mL/min (ref >60)
Glucose, Bld: 101 mg/dL — ABNORMAL HIGH (ref 65–99)
Potassium: 3.9 mmol/L (ref 3.5–5.1)
SODIUM: 139 mmol/L (ref 135–145)

## 2014-12-08 LAB — CBC
HEMATOCRIT: 38.3 % — AB (ref 39.0–52.0)
Hemoglobin: 12.7 g/dL — ABNORMAL LOW (ref 13.0–17.0)
MCH: 30 pg (ref 26.0–34.0)
MCHC: 33.2 g/dL (ref 30.0–36.0)
MCV: 90.5 fL (ref 78.0–100.0)
PLATELETS: 211 10*3/uL (ref 150–400)
RBC: 4.23 MIL/uL (ref 4.22–5.81)
RDW: 13.1 % (ref 11.5–15.5)
WBC: 8.3 10*3/uL (ref 4.0–10.5)

## 2014-12-08 LAB — T-HELPER CELLS (CD4) COUNT (NOT AT ARMC)
Absolute CD4: 503 /uL (ref 381–1469)
CD4 T Helper %: 19 % — ABNORMAL LOW (ref 32–62)
TOTAL LYMPHOCYTE: 21 % (ref 12–46)
Total lymphocyte count: 2646 /uL (ref 700–3300)
WBC, lymph enumeration: 12.6 10*3/uL — ABNORMAL HIGH (ref 4.0–10.5)

## 2014-12-08 NOTE — Progress Notes (Signed)
[facility]                                                                              Patient Demographics  Jimmy Davis, is a 34 y.o. male, DOB - 12/11/1980, TIR:[id number]  Admit date - 12/07/2014   Admitting Physician Azaria Bartell Krystal Eaton, MD  Outpatient Primary MD for the patient is Delman Cheadle, MD  LOS - 1   Chief Complaint  Patient presents with  . Rash       Brief HPI   Patient is a 34 year old male with HIV, anxiety, depression, substance abuse presented from the urgent care with left lower extremity cellulitis. Patient reported that he was sitting in a barn on Sunday but doesnot remember any insect bite. He denies any IV drug use. Night before the admission, he noticed pain in his left leg on walking. He woke up on the day of admission with with redness on his left lower leg. Patient went to the urgent care and was sent to the ER for further evaluation and admission. Patient was given 1 g of Rocephin. He admitted to subjective fevers and chills.    Assessment & Plan    Principal Problem: Left lower extremity Cellulitis, SIRS : Patient met SIRS criteria on admission due to leukocytosis, tachycardia, low-grade fever, source being the left lower extremities cellulitis- improving - Doppler ultrasound of the lower extremity shows no DVT  - White count improving, blood cultures negative so far, continue IV vancomycin, if improving tomorrow, will transition to oral antiemetics.   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 if continues to improve  Time Spent in minutes  25 minutes  Procedures  Venous Doppler lower extremities  Consults   None  DVT Prophylaxis heparin   Medications  Scheduled Meds: . dextromethorphan-guaiFENesin  1 tablet Oral BID  . elvitegravir-cobicistat-emtricitabine-tenofovir  1 tablet Oral Q breakfast  . FLUoxetine  60 mg Oral Daily  . heparin  5,000 Units Subcutaneous 3 times per day  . loratadine  10 mg Oral Daily  . multivitamin with minerals  1 tablet Oral Daily  . sodium chloride  3 mL Intravenous Q12H  . vancomycin  1,000 mg Intravenous Q8H   Continuous Infusions: . sodium chloride 75 mL/hr at 12/08/14 1130   PRN Meds:.acetaminophen **OR** acetaminophen, alum & mag hydroxide-simeth, guaiFENesin-dextromethorphan, HYDROcodone-acetaminophen, HYDROmorphone (DILAUDID) injection, ondansetron **OR** ondansetron (ZOFRAN) IV, polyvinyl alcohol   Antibiotics   Anti-infectives    Start     Dose/Rate Route Frequency Ordered Stop   12/08/14 0800  elvitegravir-cobicistat-emtricitabine-tenofovir (GENVOYA) 150-150-200-10 MG tablet 1 tablet     1 tablet Oral Daily with breakfast 12/07/14 1743     06 /02/16 0200  vancomycin (VANCOCIN) IVPB 1000  mg/200 mL premix     1,000 mg 200 mL/hr over 60 Minutes Intravenous Every 8 hours 12/07/14 1820     12/07/14 1600  doxycycline (VIBRA-TABS) tablet 100 mg  Status:  Discontinued     100 mg Oral  Once 12/07/14 1554 12/07/14 1556   12/07/14 1600  vancomycin (VANCOCIN) 1,500 mg in sodium chloride 0.9 % 500 mL IVPB     1,500 mg 250 mL/hr over 120 Minutes Intravenous  Once 12/07/14 1557 12/07/14 1852        Subjective:   Jimmy Davis was seen and examined today.No fevers or chills. Left lower extremity cellulitis improving, still significant erythema, not ready for DC.  Patient denies dizziness, chest pain, shortness of breath, abdominal pain, N/V/D/C, new weakness, numbess, tingling. No acute  events overnight.    Objective:   Blood pressure 112/69, pulse 80, temperature 97.7 F (36.5 C), temperature source Oral, resp. rate 20, height 5' 9"  (1.753 m), weight 90.719 kg (200 lb), SpO2 99 %.  Wt Readings from Last 3 Encounters:  12/07/14 90.719 kg (200 lb)  12/07/14 90.719 kg (200 lb)  09/21/14 84.369 kg (186 lb)     Intake/Output Summary (Last 24 hours) at 12/08/14 1225 Last data filed at 12/08/14 1131  Gross per 24 hour  Intake 2098.75 ml  Output   1100 ml  Net 998.75 ml    Exam  General: Alert and oriented x 3, NAD  HEENT:  PERRLA, EOMI, Anicteic Sclera, mucous membranes moist.   Neck: Supple, no JVD, no masses  CVS: S1 S2 auscultated, no rubs, murmurs or gallops. Regular rate and rhythm.  Respiratory: Clear to auscultation bilaterally, no wheezing, rales or rhonchi  Abdomen: Soft, nontender, nondistended, + bowel sounds  Ext: no cyanosis clubbing or edema  Neuro: AAOx3, Cr N's II- XII. Strength 5/5 upper and lower extremities bilaterally  Skin:  still has erythema to the left lower extremity, slowly improving   Psych: Normal affect and demeanor, alert and oriented x3    Data Review   Micro Results No results found for this or any previous visit (from the past 240 hour(s)).  Radiology Reports No results found.  CBC  Recent Labs Lab 12/07/14 1409 12/07/14 1636 12/07/14 1900 12/08/14 0502  WBC 13.2*  --  11.6* 8.3  HGB 14.0* 14.3 13.3 12.7*  HCT 41.3* 42.0 38.2* 38.3*  PLT  --   --  225 211  MCV 89.7  --  88.6 90.5  MCH 30.3  --  30.9 30.0  MCHC 33.8  --  34.8 33.2  RDW  --   --  12.8 13.1    Chemistries   Recent Labs Lab 12/07/14 1400 12/07/14 1636 12/07/14 1900 12/08/14 0502  NA 137 137  --  139  K 3.8 3.8  --  3.9  CL 99 101  --  107  CO2 26  --   --  25  GLUCOSE 88 89  --  101*  BUN 13 12  --  13  CREATININE 1.01 1.00 0.89 0.96  CALCIUM 9.2  --   --  8.8*  AST 29  --   --   --   ALT 44  --   --   --   ALKPHOS 109   --   --   --   BILITOT 0.7  --   --   --    ------------------------------------------------------------------------------------------------------------------ estimated creatinine clearance is 119.6 mL/min (by C-G formula based on Cr of 0.96). ------------------------------------------------------------------------------------------------------------------ No  results for input(s): HGBA1C in the last 72 hours. ------------------------------------------------------------------------------------------------------------------ No results for input(s): CHOL, HDL, LDLCALC, TRIG, CHOLHDL, LDLDIRECT in the last 72 hours. ------------------------------------------------------------------------------------------------------------------  Recent Labs  12/07/14 1900  TSH 2.760   ------------------------------------------------------------------------------------------------------------------ No results for input(s): VITAMINB12, FOLATE, FERRITIN, TIBC, IRON, RETICCTPCT in the last 72 hours.  Coagulation profile No results for input(s): INR, PROTIME in the last 168 hours.  No results for input(s): DDIMER in the last 72 hours.  Cardiac Enzymes No results for input(s): CKMB, TROPONINI, MYOGLOBIN in the last 168 hours.  Invalid input(s): CK ------------------------------------------------------------------------------------------------------------------ Invalid input(s): POCBNP  No results for input(s): GLUCAP in the last 72 hours.   Keniya Schlotterbeck M.D. [facility] 12/08/2014, 12:25 PM  Pager: [phone]   Between 7am to 7pm - call Pager - [phone]  After 7pm go to [email or web address] - password [email or web address]  Call night coverage person covering after 7pm

## 2014-12-08 NOTE — Progress Notes (Signed)
OT Cancellation Note  Patient Details Name: Jimmy SoxJonathan Terpstra MRN: [id number] DOB: 1980-09-10   Cancelled Treatment:    Reason Eval/Treat Not Completed: OT screened, no needs identified, will sign off.  Checked with pt:  He has been getting up and using bathroom as well as completing adls.  He lives alone but feels he will be fine:  He states his leg is feeling much better. Will sign off.  Jaun Galluzzo 12/08/2014, 10:50 AM  Marica OtterMaryellen Dayanira Giovannetti, OTR/L [phone] 12/08/2014

## 2014-12-08 NOTE — Progress Notes (Signed)
Left lower extremity venous duplex completed.  Left:  No evidence of DVT, superficial thrombosis, or Baker's cyst.  Right:  Negative for DVT in the common femoral vein.  

## 2014-12-09 LAB — URINE CULTURE
COLONY COUNT: NO GROWTH
CULTURE: NO GROWTH

## 2014-12-09 LAB — HEMOGLOBIN A1C
HEMOGLOBIN A1C: 5.2 % (ref 4.8–5.6)
Mean Plasma Glucose: 103 mg/dL

## 2014-12-09 MED ORDER — DOXYCYCLINE HYCLATE 100 MG PO TABS: 100.0000 mg | ORAL_TABLET | Freq: Two times a day (BID) | ORAL | Status: DC

## 2014-12-09 MED ORDER — PROMETHAZINE HCL 12.5 MG PO TABS: 12.5000 mg | ORAL_TABLET | Freq: Four times a day (QID) | ORAL | Status: DC | PRN

## 2014-12-09 MED ORDER — DOXYCYCLINE HYCLATE 100 MG PO TABS
100.0000 mg | ORAL_TABLET | Freq: Two times a day (BID) | ORAL | Status: DC
  Administered 2014-12-09: 100 mg via ORAL
  Filled 2014-12-09 (×2): qty 1

## 2014-12-09 MED ORDER — TRAMADOL HCL 50 MG PO TABS: 50.0000 mg | ORAL_TABLET | Freq: Three times a day (TID) | ORAL | Status: DC | PRN

## 2014-12-09 NOTE — Discharge Summary (Signed)
Physician Discharge Summary   Patient ID: Jimmy Davis MRN: [id number] DOB/AGE: 1981-02-02 34 y.o.  Admit date: 12/07/2014 Discharge date: 12/09/2014  Primary Care Physician:  Delman Cheadle, MD  Discharge Diagnoses:     Left lower extremities cellulitis  . Human immunodeficiency virus (HIV) disease . Substance abuse . Sepsis   Consults:  None   Recommendations for Outpatient Follow-up:  Please follow CD4 counts closely, patient was discharged on doxycycline for 10 days    DIET: Regular diet    Allergies:  No Known Allergies   Discharge Medications:   Medication List    TAKE these medications        CALCIUM-MAGNESIUM-ZINC PO  Take 3 tablets by mouth 3 (three) times a week.     doxycycline 100 MG tablet  Commonly known as:  VIBRA-TABS  Take 1 tablet (100 mg total) by mouth 2 (two) times daily with a meal. X 10days     elvitegravir-cobicistat-emtricitabine-tenofovir 150-150-200-300 MG Tabs tablet  Commonly known as:  STRIBILD  Take 1 tablet by mouth daily with breakfast.     FLUoxetine 20 MG capsule  Commonly known as:  PROZAC  Take 60 mg by mouth daily.     L-ARGININE PO  Take 1 tablet by mouth 3 (three) times a week.     L-TYROSINE PO  Take 1 tablet by mouth 3 (three) times a week.     loratadine 10 MG tablet  Commonly known as:  CLARITIN  Take 10 mg by mouth daily as needed for allergies.     multivitamin capsule  Take 1 capsule by mouth daily.     POTASSIUM PO  Take 1 capsule by mouth 2 (two) times daily as needed (potassium low).     promethazine 12.5 MG tablet  Commonly known as:  PHENERGAN  Take 1 tablet (12.5 mg total) by mouth every 6 (six) hours as needed for nausea or vomiting.     traMADol 50 MG tablet  Commonly known as:  ULTRAM  Take 1 tablet (50 mg total) by mouth every 8 (eight) hours as needed for severe pain.         Brief H and P: For complete details please refer to admission H and P, but in brief Patient is a 34 year old  male with HIV, anxiety, depression, substance abuse presented from the urgent care with left lower extremity cellulitis. Patient reported that he was sitting in a barn on Sunday but doesnot remember any insect bite. He denies any IV drug use. Night before the admission, he noticed pain in his left leg on walking. He woke up on the day of admission with with redness on his left lower leg. Patient went to the urgent care and was sent to the ER for further evaluation and admission. Patient was given 1 g of Rocephin. He admitted to subjective fevers and chills.  Hospital Course:   Left lower extremity Cellulitis, SIRS : Patient met SIRS criteria on admission due to leukocytosis, tachycardia, low-grade fever, source being the left lower extremities cellulitis- improving Doppler ultrasound of the lower extremity showed no DVT . Leukocytosis resolved. Blood cultures remain negative. Patient was placed on IV vancomycin. Patient had significant improvement with IV antibiotics for 48 hours, transition to oral doxycycline for 10 days.    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 HEENT: anicteric sclera, pupils reactive to light and accommodation CVS: S1-S2 clear no murmur rubs or gallops Chest: clear to auscultation bilaterally, no wheezing rales or rhonchi Abdomen: soft nontender, nondistended, normal bowel sounds Extremities: no cyanosis, clubbing or edema noted bilaterally, LLE cellulitis significantly  improved Neuro: Cranial nerves II-XII intact, no focal neurological deficits   The results of significant diagnostics from this hospitalization (including imaging, microbiology, ancillary and laboratory) are listed below for reference.    LAB RESULTS: Basic Metabolic Panel:  Recent Labs Lab 12/07/14 1400  12/07/14 1636 12/07/14 1900 12/08/14 0502  NA 137  --  137  --  139  K 3.8  --  3.8  --  3.9  CL 99  --  101  --  107  CO2 26  --   --   --  25  GLUCOSE 88  --  89  --  101*  BUN 13  --  12  --  13  CREATININE 1.01  < > 1.00 0.89 0.96  CALCIUM 9.2  --   --   --  8.8*  < > = values in this interval not displayed. Liver Function Tests:  Recent Labs Lab 12/07/14 1400  AST 29  ALT 44  ALKPHOS 109  BILITOT 0.7  PROT 7.5  ALBUMIN 4.7    Recent Labs Lab 12/07/14 1400  LIPASE 10   No results for input(s): AMMONIA in the last 168 hours. CBC:  Recent Labs Lab 12/07/14 1900 12/08/14 0502  WBC 11.6* 8.3  HGB 13.3 12.7*  HCT 38.2* 38.3*  MCV 88.6 90.5  PLT 225 211   Cardiac Enzymes: No results for input(s): CKTOTAL, CKMB, CKMBINDEX, TROPONINI in the last 168 hours. BNP: Invalid input(s): POCBNP CBG: No results for input(s): GLUCAP in the last 168 hours.  Significant Diagnostic Studies:  No results found.  2D ECHO:   Disposition and Follow-up:     Discharge Instructions    Diet general    Complete by:  As directed      Increase activity slowly    Complete by:  As directed             DISPOSITION: Home   DISCHARGE FOLLOW-UP Follow-up Information    Follow up with SHAW,EVA, MD. Schedule an appointment as soon as possible for a visit in 10 days.   Specialty:  Family Medicine   Why:  No appointment needed. Please follow-up with Dr. Brigitte Pulse on a walk-in basis within the next 10 days.    Contact information:   [place] [address] [phone]        Time spent on Discharge: 25 minutes  Signed:   Darlis Wragg  M.D. [facility] 12/09/2014, 1:36 PM Pager: [phone]

## 2014-12-09 NOTE — Care Management Note (Signed)
Case Management Note  Patient Details  Name: Jimmy Davis MRN: [id number] Date of Birth: 08-20-1980  Subjective/Objective:      34 yo male admitted with cellulitis              Action/Plan: CM consulted for affordability. Patient stated that he follows at [facility] for his mental health care and medications, which are at no cost. Discussed possible prescription for antibiotics upon discharge and explained that if they were not affordable then CM can assist with resources for affordability.  Expected Discharge Date:   (unknown)               Expected Discharge Plan:  Home/Self Care  In-House Referral:     Discharge planning Services  CM Consult  Post Acute Care Choice:  NA Choice offered to:  NA  DME Arranged:    DME Agency:     HH Arranged:    HH Agency:     Status of Service:  Completed, signed off  Medicare Important Message Given:    Date Medicare IM Given:    Medicare IM give by:    Date Additional Medicare IM Given:    Additional Medicare Important Message give by:     If discussed at Long Length of Stay Meetings, dates discussed:    Additional Comments:  Gerrit HallsMuza, Nyliah Nierenberg S, RN 12/09/2014, 11:25 AM

## 2014-12-13 ENCOUNTER — Encounter: Payer: Self-pay | Admitting: Internal Medicine

## 2014-12-14 ENCOUNTER — Inpatient Hospital Stay (HOSPITAL_COMMUNITY)
Admission: EM | Admit: 2014-12-14 | Discharge: 2014-12-15 | DRG: 602 | Disposition: A | Discharge status: Home / Self Care | Payer: Self-pay | Attending: Internal Medicine | Admitting: Internal Medicine

## 2014-12-14 ENCOUNTER — Encounter (HOSPITAL_COMMUNITY): Payer: Self-pay | Admitting: Emergency Medicine

## 2014-12-14 DIAGNOSIS — F1910 Other psychoactive substance abuse, uncomplicated: Secondary | ICD-10-CM | POA: Diagnosis present

## 2014-12-14 DIAGNOSIS — L0390 Cellulitis, unspecified: Secondary | ICD-10-CM | POA: Diagnosis present

## 2014-12-14 DIAGNOSIS — B20 Human immunodeficiency virus [HIV] disease: Secondary | ICD-10-CM | POA: Diagnosis present

## 2014-12-14 DIAGNOSIS — F419 Anxiety disorder, unspecified: Secondary | ICD-10-CM | POA: Diagnosis present

## 2014-12-14 DIAGNOSIS — L03116 Cellulitis of left lower limb: Principal | ICD-10-CM | POA: Diagnosis present

## 2014-12-14 DIAGNOSIS — F329 Major depressive disorder, single episode, unspecified: Secondary | ICD-10-CM | POA: Diagnosis present

## 2014-12-14 DIAGNOSIS — F1510 Other stimulant abuse, uncomplicated: Secondary | ICD-10-CM | POA: Diagnosis present

## 2014-12-14 DIAGNOSIS — Z79899 Other long term (current) drug therapy: Secondary | ICD-10-CM

## 2014-12-14 LAB — I-STAT CHEM 8, ED
BUN: 16 mg/dL (ref 6–20)
CALCIUM ION: 1.11 mmol/L — AB (ref 1.12–1.23)
CHLORIDE: 98 mmol/L — AB (ref 101–111)
CREATININE: 1.1 mg/dL (ref 0.61–1.24)
Glucose, Bld: 100 mg/dL — ABNORMAL HIGH (ref 65–99)
HCT: 44 % (ref 39.0–52.0)
HEMOGLOBIN: 15 g/dL (ref 13.0–17.0)
POTASSIUM: 3.6 mmol/L (ref 3.5–5.1)
Sodium: 137 mmol/L (ref 135–145)
TCO2: 25 mmol/L (ref 0–100)

## 2014-12-14 LAB — CBC WITH DIFFERENTIAL/PLATELET
BASOS ABS: 0 10*3/uL (ref 0.0–0.1)
Basophils Relative: 0 % (ref 0–1)
EOS PCT: 2 % (ref 0–5)
Eosinophils Absolute: 0.2 10*3/uL (ref 0.0–0.7)
HEMATOCRIT: 41 % (ref 39.0–52.0)
HEMOGLOBIN: 14.2 g/dL (ref 13.0–17.0)
Lymphocytes Relative: 27 % (ref 12–46)
Lymphs Abs: 3.1 10*3/uL (ref 0.7–4.0)
MCH: 31.1 pg (ref 26.0–34.0)
MCHC: 34.6 g/dL (ref 30.0–36.0)
MCV: 89.7 fL (ref 78.0–100.0)
MONOS PCT: 8 % (ref 3–12)
Monocytes Absolute: 1 10*3/uL (ref 0.1–1.0)
NEUTROS PCT: 63 % (ref 43–77)
Neutro Abs: 7.4 10*3/uL (ref 1.7–7.7)
Platelets: 273 10*3/uL (ref 150–400)
RBC: 4.57 MIL/uL (ref 4.22–5.81)
RDW: 12.9 % (ref 11.5–15.5)
WBC: 11.7 10*3/uL — ABNORMAL HIGH (ref 4.0–10.5)

## 2014-12-14 LAB — CULTURE, BLOOD (ROUTINE X 2)
Culture: NO GROWTH
Culture: NO GROWTH

## 2014-12-14 LAB — I-STAT CG4 LACTIC ACID, ED: Lactic Acid, Venous: 0.73 mmol/L (ref 0.5–2.0)

## 2014-12-14 MED ORDER — ENOXAPARIN SODIUM 40 MG/0.4ML ~~LOC~~ SOLN
40.0000 mg | SUBCUTANEOUS | Status: DC
  Administered 2014-12-14 – 2014-12-15 (×2): 40 mg via SUBCUTANEOUS
  Filled 2014-12-14 (×4): qty 0.4

## 2014-12-14 MED ORDER — ONDANSETRON HCL 4 MG/2ML IJ SOLN: 4.0000 mg | Freq: Four times a day (QID) | INTRAMUSCULAR | Status: DC | PRN

## 2014-12-14 MED ORDER — FLUOXETINE HCL 20 MG PO CAPS
60.0000 mg | ORAL_CAPSULE | Freq: Every day | ORAL | Status: DC
  Administered 2014-12-14 – 2014-12-15 (×2): 60 mg via ORAL
  Filled 2014-12-14 (×4): qty 3

## 2014-12-14 MED ORDER — ACETAMINOPHEN 650 MG RE SUPP: 650.0000 mg | Freq: Four times a day (QID) | RECTAL | Status: DC | PRN

## 2014-12-14 MED ORDER — ALUM & MAG HYDROXIDE-SIMETH 200-200-20 MG/5ML PO SUSP: 30.0000 mL | Freq: Four times a day (QID) | ORAL | Status: DC | PRN

## 2014-12-14 MED ORDER — VANCOMYCIN HCL IN DEXTROSE 1-5 GM/200ML-% IV SOLN
1000.0000 mg | Freq: Three times a day (TID) | INTRAVENOUS | Status: DC
  Administered 2014-12-14 – 2014-12-15 (×4): 1000 mg via INTRAVENOUS
  Filled 2014-12-14 (×5): qty 200

## 2014-12-14 MED ORDER — ELVITEG-COBIC-EMTRICIT-TENOFAF 150-150-200-10 MG PO TABS
1.0000 | ORAL_TABLET | Freq: Every day | ORAL | Status: DC
  Administered 2014-12-14 – 2014-12-15 (×2): 1 via ORAL
  Filled 2014-12-14 (×4): qty 1

## 2014-12-14 MED ORDER — ONDANSETRON HCL 4 MG PO TABS: 4.0000 mg | ORAL_TABLET | Freq: Four times a day (QID) | ORAL | Status: DC | PRN

## 2014-12-14 MED ORDER — SODIUM CHLORIDE 0.9 % IV BOLUS (SEPSIS)
1000.0000 mL | Freq: Once | INTRAVENOUS | Status: AC
  Administered 2014-12-14: 1000 mL via INTRAVENOUS

## 2014-12-14 MED ORDER — VANCOMYCIN HCL 10 G IV SOLR
1500.0000 mg | Freq: Once | INTRAVENOUS | Status: AC
  Administered 2014-12-14: 1500 mg via INTRAVENOUS
  Filled 2014-12-14: qty 1500

## 2014-12-14 MED ORDER — ACETAMINOPHEN 325 MG PO TABS: 650.0000 mg | ORAL_TABLET | Freq: Four times a day (QID) | ORAL | Status: DC | PRN

## 2014-12-14 NOTE — Progress Notes (Signed)
Patient admitted earlier today, for left lower ext cellulitis failed outpatient doxycycline, improving on IV vancomycin, seen and examined, has no complaints, cellulitis appears to be mildly regressing from demarcation . Plan:  - Continue with IV vancomycin for cellulitis - Resume HAART medication for HIV  Jimmy Bienenstockawood Latasha Puskas MD

## 2014-12-14 NOTE — ED Provider Notes (Signed)
CSN: [id number]     Arrival date & time 12/14/14  0208 History   First MD Initiated Contact with Patient 12/14/14 [phone]     Chief Complaint  Patient presents with  . Recurrent Skin Infections     (Consider location/radiation/quality/duration/timing/severity/associated sxs/prior Treatment) HPI   Jimmy Davis is a 34yo male, PMH of substance abuse, HIV and cellulitis, coming back with worsening pain, redness and swelling in the LLE.  He states it got better in his last admission with vanc and was sent home with doxy.  He has been compliant but use crystal meth again 2 days ago.  He then noticed worsening of his cellulitis and he re=presents.  He denies other complaints.  10 Systems reviewed and are negative for acute change except as noted in the HPI.   Past Medical History  Diagnosis Date  . Anxiety   . Depression   . Substance abuse   . Human immunodeficiency virus (HIV) disease    Past Surgical History  Procedure Laterality Date  . Wisdom tooth extraction     Family History  Problem Relation Age of Onset  . Heart disease Father   . Mental illness Maternal Grandmother    History  Substance Use Topics  . Smoking status: Never Smoker   . Smokeless tobacco: Never Used  . Alcohol Use: No    Review of Systems    Allergies  Review of patient's allergies indicates no known allergies.  Home Medications   Prior to Admission medications   Medication Sig Start Date End Date Taking? Authorizing Provider  aspirin-acetaminophen-caffeine (EXCEDRIN MIGRAINE) [phone] MG per tablet Take 1 tablet by mouth every 6 (six) hours as needed for headache.   Yes Historical Provider, MD  CALCIUM-MAGNESIUM-ZINC PO Take 3 tablets by mouth 3 (three) times a week.   Yes Historical Provider, MD  doxycycline (VIBRA-TABS) 100 MG tablet Take 1 tablet (100 mg total) by mouth 2 (two) times daily with a meal. X 10days 12/09/14  Yes Ripudeep Jenna LuoK Rai, MD  elvitegravir-cobicistat-emtricitabine-tenofovir  (STRIBILD) 150-150-200-300 MG TABS tablet Take 1 tablet by mouth daily with breakfast. 07/11/14  Yes Judyann Munsonynthia Snider, MD  FLUoxetine (PROZAC) 20 MG capsule Take 60 mg by mouth daily.   Yes Historical Provider, MD  L-ARGININE PO Take 1 tablet by mouth 3 (three) times a week.   Yes Historical Provider, MD  loratadine (CLARITIN) 10 MG tablet Take 10 mg by mouth daily as needed for allergies.   Yes Historical Provider, MD  Multiple Vitamin (MULTIVITAMIN) capsule Take 1 capsule by mouth daily.   Yes Historical Provider, MD  POTASSIUM PO Take 1 capsule by mouth See admin instructions. Three times per week   Yes Historical Provider, MD  promethazine (PHENERGAN) 12.5 MG tablet Take 1 tablet (12.5 mg total) by mouth every 6 (six) hours as needed for nausea or vomiting. Patient not taking: Reported on 12/14/2014 12/09/14   Ripudeep Jenna LuoK Rai, MD  traMADol (ULTRAM) 50 MG tablet Take 1 tablet (50 mg total) by mouth every 8 (eight) hours as needed for severe pain. 12/09/14   Ripudeep K Rai, MD   BP 133/76 mmHg  Pulse 103  Temp(Src) 98 F (36.7 C) (Oral)  Resp 22  SpO2 100% Physical Exam  Constitutional: He is oriented to person, place, and time. Vital signs are normal. He appears well-developed and well-nourished.  Non-toxic appearance. He does not appear ill. No distress.  HENT:  Head: Normocephalic and atraumatic.  Nose: Nose normal.  Mouth/Throat: Oropharynx is clear and  moist. No oropharyngeal exudate.  Eyes: Conjunctivae and EOM are normal. Pupils are equal, round, and reactive to light. No scleral icterus.  Neck: Normal range of motion. Neck supple. No tracheal deviation, no edema, no erythema and normal range of motion present. No thyroid mass and no thyromegaly present.  Cardiovascular: Normal rate, regular rhythm, S1 normal, S2 normal, normal heart sounds, intact distal pulses and normal pulses.  Exam reveals no gallop and no friction rub.   No murmur heard. Pulses:      Radial pulses are 2+ on the right  side, and 2+ on the left side.       Dorsalis pedis pulses are 2+ on the right side, and 2+ on the left side.  Pulmonary/Chest: Effort normal and breath sounds normal. No respiratory distress. He has no wheezes. He has no rhonchi. He has no rales.  Abdominal: Soft. Normal appearance and bowel sounds are normal. He exhibits no distension, no ascites and no mass. There is no hepatosplenomegaly. There is no tenderness. There is no rebound, no guarding and no CVA tenderness.  Musculoskeletal: Normal range of motion. He exhibits no edema or tenderness.  Lymphadenopathy:    He has no cervical adenopathy.  Neurological: He is alert and oriented to person, place, and time. He has normal strength. No cranial nerve deficit or sensory deficit.  Skin: Skin is warm, dry and intact. Rash noted. No petechiae noted. He is not diaphoretic. There is erythema. No pallor.  TTP of LLE, warmth, erythematous  Psychiatric: He has a normal mood and affect. His behavior is normal. Judgment normal.  Nursing note and vitals reviewed.   ED Course  Procedures (including critical care time) Labs Review Labs Reviewed  CBC WITH DIFFERENTIAL/PLATELET - Abnormal; Notable for the following:    WBC 11.7 (*)    All other components within normal limits  I-STAT CHEM 8, ED - Abnormal; Notable for the following:    Chloride 98 (*)    Glucose, Bld 100 (*)    Calcium, Ion 1.11 (*)    All other components within normal limits  VARICELLA ZOSTER ANTIBODY, IGG  I-STAT CG4 LACTIC ACID, ED    Imaging Review No results found.   EKG Interpretation None      MDM   Final diagnoses:  Cellulitis of left lower extremity   Patient presents to the ED for recurrent cellulitis after failing outpatient therapy.  He will require IV antibiotics again.  I gave him vanc in the ED.  He will be admitted for further control of his cellulitis.  Perhaps he needs strep covered as well upon DC, as the doxycycline did not work for  staph.    Tomasita Crumble, MD 12/14/14 [phone]

## 2014-12-14 NOTE — H&P (Addendum)
History and Physical  Terrall Bley WUJ:[id number] DOB: 12/24/1980 DOA: 12/14/2014  Referring physician: Dr Mora Bellman, ED physician PCP: Norberto Sorenson, MD   Chief Complaint: Redness, pain in leg, infection worse  HPI: Jimmy Davis is a 34 y.o. male  With history of HIV, methamphetamine use, recent hospitalization on 6/1 for sepsis and cellulitis. He was admitted on IV vancomycin which began to improve his cellulitis. The patient had a CD4 percent counts drawn which was 19%, which was consistent from his percent counts in December 2015 and last year. He also had a duplex of his left lower extremity which revealed widely patent deep veins. He was discharged on doxycycline which he was taking as prescribed since his discharge on the third. His rash and swelling were fine for 2 or 3 days then his pain and redness increased again. Additionally, he began to have some swelling of his left foot which did not occur during the first incident. The patient denies missing doses, however he did use crystal meth last night around 1 AM. Denies fevers, chills, nausea, abdominal pain, diarrhea. Movement and palpation increases pain. Rest decreases his pain.   Review of Systems:   Pt denies any chest pain, shortness of breath, palpitations, hallucinations.  Review of systems are otherwise negative  Past Medical History  Diagnosis Date  . Anxiety   . Depression   . Substance abuse   . Human immunodeficiency virus (HIV) disease    Past Surgical History  Procedure Laterality Date  . Wisdom tooth extraction     Social History:  reports that he has never smoked. He has never used smokeless tobacco. He reports that he uses illicit drugs (Methamphetamines) about 3 times per week. He reports that he does not drink alcohol. Patient lives at home & is able to participate in activities of daily living   No Known Allergies  Family History  Problem Relation Age of Onset  . Heart disease Father   . Mental illness  Maternal Grandmother       Prior to Admission medications   Medication Sig Start Date End Date Taking? Authorizing Provider  aspirin-acetaminophen-caffeine (EXCEDRIN MIGRAINE) [phone] MG per tablet Take 1 tablet by mouth every 6 (six) hours as needed for headache.   Yes Historical Provider, MD  CALCIUM-MAGNESIUM-ZINC PO Take 3 tablets by mouth 3 (three) times a week.   Yes Historical Provider, MD  doxycycline (VIBRA-TABS) 100 MG tablet Take 1 tablet (100 mg total) by mouth 2 (two) times daily with a meal. X 10days 12/09/14  Yes Ripudeep Jenna Luo, MD  elvitegravir-cobicistat-emtricitabine-tenofovir (STRIBILD) 150-150-200-300 MG TABS tablet Take 1 tablet by mouth daily with breakfast. 07/11/14  Yes Judyann Munson, MD  FLUoxetine (PROZAC) 20 MG capsule Take 60 mg by mouth daily.   Yes Historical Provider, MD  L-ARGININE PO Take 1 tablet by mouth 3 (three) times a week.   Yes Historical Provider, MD  loratadine (CLARITIN) 10 MG tablet Take 10 mg by mouth daily as needed for allergies.   Yes Historical Provider, MD  Multiple Vitamin (MULTIVITAMIN) capsule Take 1 capsule by mouth daily.   Yes Historical Provider, MD  POTASSIUM PO Take 1 capsule by mouth See admin instructions. Three times per week   Yes Historical Provider, MD  promethazine (PHENERGAN) 12.5 MG tablet Take 1 tablet (12.5 mg total) by mouth every 6 (six) hours as needed for nausea or vomiting. Patient not taking: Reported on 12/14/2014 12/09/14   Ripudeep Jenna Luo, MD  traMADol (ULTRAM) 50 MG tablet Take  1 tablet (50 mg total) by mouth every 8 (eight) hours as needed for severe pain. 12/09/14   Ripudeep Jenna LuoK Rai, MD    Physical Exam: BP 137/79 mmHg  Pulse 102  Resp 22  SpO2 100%  General: Young Caucasian male. Awake and alert and oriented x3. No acute cardiopulmonary distress.  Eyes: Pupils equal, round, reactive to light. Extraocular muscles are intact. Sclerae anicteric and noninjected.  ENT:  Moist mucosal membranes. No mucosal lesions. Teeth  in moderate repair  Neck: Neck supple without lymphadenopathy. No carotid bruits. No masses palpated.  Cardiovascular: Regular rate with normal S1-S2 sounds. No murmurs, rubs, gallops auscultated. No JVD.  Respiratory: Good respiratory effort with no wheezes, rales, rhonchi. Lungs clear to auscultation bilaterally.  Abdomen: Soft, nontender, nondistended. Active bowel sounds. No masses or hepatosplenomegaly  Skin: Dry, warm to touch. 2+ dorsalis pedis and radial pulses. There is redness that covers the majority of the pretibial area which is more intense distally over the ankle and as it extends proximally lightens. The area was marked. Additionally, he has significant swelling of his left foot. The area of the lower leg is tender to the touch. He does have some calf pain with palpation Musculoskeletal: All major joints not erythematous nontender.  Psychiatric: Intact judgment and insight.  Neurologic: No focal neurological deficits. Cranial nerves II through XII are grossly intact.           Labs on Admission:  Basic Metabolic Panel:  Recent Labs Lab 12/07/14 1400 12/07/14 1636 12/07/14 1900 12/08/14 0502 12/14/14 0504  NA 137 137  --  139 137  K 3.8 3.8  --  3.9 3.6  CL 99 101  --  107 98*  CO2 26  --   --  25  --   GLUCOSE 88 89  --  101* 100*  BUN 13 12  --  13 16  CREATININE 1.01 1.00 0.89 0.96 1.10  CALCIUM 9.2  --   --  8.8*  --    Liver Function Tests:  Recent Labs Lab 12/07/14 1400  AST 29  ALT 44  ALKPHOS 109  BILITOT 0.7  PROT 7.5  ALBUMIN 4.7    Recent Labs Lab 12/07/14 1400  LIPASE 10   No results for input(s): AMMONIA in the last 168 hours. CBC:  Recent Labs Lab 12/07/14 1409 12/07/14 1636 12/07/14 1900 12/08/14 0502 12/14/14 0457 12/14/14 0504  WBC 13.2*  --  11.6* 8.3 11.7*  --   NEUTROABS  --   --   --   --  7.4  --   HGB 14.0* 14.3 13.3 12.7* 14.2 15.0  HCT 41.3* 42.0 38.2* 38.3* 41.0 44.0  MCV 89.7  --  88.6 90.5 89.7  --   PLT  --    --  225 211 273  --    Cardiac Enzymes: No results for input(s): CKTOTAL, CKMB, CKMBINDEX, TROPONINI in the last 168 hours.  BNP (last 3 results) No results for input(s): BNP in the last 8760 hours.  ProBNP (last 3 results) No results for input(s): PROBNP in the last 8760 hours.  CBG: No results for input(s): GLUCAP in the last 168 hours.  Radiological Exams on Admission: No results found.  Assessment/Plan Present on Admission:  . Cellulitis . Substance abuse  This patient was discussed with the ED physician, including pertinent vitals, physical exam findings, labs, and imaging.  We also discussed care given by the ED provider.  #1 cellulitis - worsening and failed outpatient  treatment #2 substance abuse - High on crystal meth   #3 HIV   Admit for outpatient antibiotic failure Continue IV vancomycin Patient did have a lower extremity Doppler done which showed deep veins widely patent - I do feel that this sufficiently reassures against DVT Repeat CBC, metabolic panel tomorrow morning We'll observe the patient for any acute decompensation from his substance abuse, however coming off medication usually results in somnolence with very little medical emergency. Continue antivirals   DVT prophylaxis: Lovenox  Consultants: None  Code Status: Full code   Family Communication: None    Levie Heritage, DO [facility] Pager [phone]

## 2014-12-14 NOTE — ED Notes (Signed)
Pt states he was seen and treated last week for cellulitis of his left leg  Pt states he is currently taking his antibiotics  Pt states today his left leg is more red and his left foot is swollen and painful

## 2014-12-14 NOTE — Progress Notes (Signed)
ANTIBIOTIC CONSULT NOTE - INITIAL  Pharmacy Consult for vancomycin  Indication: cellulitis  No Known Allergies  Patient Measurements:   Adjusted Body Weight:   Vital Signs: BP: 137/79 mmHg (06/08 0456) Pulse Rate: 102 (06/08 0456) Intake/Output from previous day:   Intake/Output from this shift:    Labs:  Recent Labs  12/14/14 0457 12/14/14 0504  WBC 11.7*  --   HGB 14.2 15.0  PLT 273  --   CREATININE  --  1.10   Estimated Creatinine Clearance: 104.3 mL/min (by C-G formula based on Cr of 1.1). No results for input(s): VANCOTROUGH, VANCOPEAK, VANCORANDOM, GENTTROUGH, GENTPEAK, GENTRANDOM, TOBRATROUGH, TOBRAPEAK, TOBRARND, AMIKACINPEAK, AMIKACINTROU, AMIKACIN in the last 72 hours.   Microbiology: Recent Results (from the past 720 hour(s))  Culture, blood (routine x 2)     Status: None (Preliminary result)   Collection Time: 12/07/14  7:00 PM  Result Value Ref Range Status   Specimen Description BLOOD LEFT ARM  Final   Special Requests BOTTLES DRAWN AEROBIC ONLY 6CC  Final   Culture   Final           BLOOD CULTURE RECEIVED NO GROWTH TO DATE CULTURE WILL BE HELD FOR 5 DAYS BEFORE ISSUING A FINAL NEGATIVE REPORT Performed at [facility]    Report Status PENDING  Incomplete  Culture, blood (routine x 2)     Status: None (Preliminary result)   Collection Time: 12/07/14  7:00 PM  Result Value Ref Range Status   Specimen Description BLOOD RIGHT ARM  Final   Special Requests BOTTLES DRAWN AEROBIC AND ANAEROBIC  5ML  Final   Culture   Final           BLOOD CULTURE RECEIVED NO GROWTH TO DATE CULTURE WILL BE HELD FOR 5 DAYS BEFORE ISSUING A FINAL NEGATIVE REPORT Performed at [facility]    Report Status PENDING  Incomplete  Urine culture     Status: None   Collection Time: 12/08/14 12:42 AM  Result Value Ref Range Status   Specimen Description URINE, RANDOM  Final   Special Requests NONE  Final   Colony Count NO GROWTH Performed at [facility]   Final   Culture NO GROWTH Performed at [facility]   Final   Report Status 12/09/2014 FINAL  Final    Medical History: Past Medical History  Diagnosis Date  . Anxiety   . Depression   . Substance abuse   . Human immunodeficiency virus (HIV) disease    Assessment: [age] YOM with history of HIV presents with recurrent cellulitis.  He was recently hospitalized for sepsis related to cellulitis.  He was given vancomycin during hospitalization then discharged on doxycycline.  He has experienced increased pain and redness of LLE.  6/8 >> vancomycin >>  WBC slightly elevated Renal: SCr WNL (slightly above value from recent admission) afebrile   For non-purulent cellulitis, likely failed doxycyline d/t its poor Grp A strep coverage.    Goal of Therapy:  Vancomycin trough level 10-15 mcg/ml  Plan:   Vancomycin 1500mg  IV x 1 in ED then 1gm IV q8h  Monitor renal function  Check vancomycin if remains on vancomycin >48hrs  Juliette Alcideustin Zeigler, PharmD, BCPS.   Pager: [phone] 12/14/2014,6:41 AM

## 2014-12-15 LAB — CBC
HCT: 36.5 % — ABNORMAL LOW (ref 39.0–52.0)
Hemoglobin: 12.3 g/dL — ABNORMAL LOW (ref 13.0–17.0)
MCH: 30.1 pg (ref 26.0–34.0)
MCHC: 33.7 g/dL (ref 30.0–36.0)
MCV: 89.5 fL (ref 78.0–100.0)
Platelets: 225 10*3/uL (ref 150–400)
RBC: 4.08 MIL/uL — AB (ref 4.22–5.81)
RDW: 12.9 % (ref 11.5–15.5)
WBC: 5.9 10*3/uL (ref 4.0–10.5)

## 2014-12-15 LAB — BASIC METABOLIC PANEL
Anion gap: 7 (ref 5–15)
BUN: 12 mg/dL (ref 6–20)
CALCIUM: 8.4 mg/dL — AB (ref 8.9–10.3)
CHLORIDE: 105 mmol/L (ref 101–111)
CO2: 27 mmol/L (ref 22–32)
Creatinine, Ser: 0.83 mg/dL (ref 0.61–1.24)
GFR calc Af Amer: >60 mL/min (ref >60)
GFR calc non Af Amer: >60 mL/min (ref >60)
Glucose, Bld: 97 mg/dL (ref 65–99)
Potassium: 3.7 mmol/L (ref 3.5–5.1)
Sodium: 139 mmol/L (ref 135–145)

## 2014-12-15 LAB — VARICELLA ZOSTER ANTIBODY, IGG: VARICELLA IGG: 886 {index} (ref >165)

## 2014-12-15 MED ORDER — SULFAMETHOXAZOLE-TRIMETHOPRIM 800-160 MG PO TABS
1.0000 | ORAL_TABLET | Freq: Two times a day (BID) | ORAL | Status: AC
Start: 2014-12-15 — End: 2014-12-19

## 2014-12-15 MED ORDER — AMOXICILLIN 500 MG PO CAPS: 500.0000 mg | ORAL_CAPSULE | Freq: Three times a day (TID) | ORAL | Status: DC

## 2014-12-15 NOTE — Discharge Instructions (Signed)
Follow with Primary MD Norberto Sorenson, MD in 7 days   Get CBC, CMP, 2 view Chest X ray checked  by Primary MD next visit.    Activity: As tolerated with Full fall precautions use walker/cane & assistance as needed   Disposition Home    Diet: Heart Healthy  , with feeding assistance and aspiration precautions.  For Heart failure patients - Check your Weight same time everyday, if you gain over 2 pounds, or you develop in leg swelling, experience more shortness of breath or chest pain, call your Primary MD immediately. Follow Cardiac Low Salt Diet and 1.5 lit/day fluid restriction.   On your next visit with your primary care physician please Get Medicines reviewed and adjusted.   Please request your Prim.MD to go over all Hospital Tests and Procedure/Radiological results at the follow up, please get all Hospital records sent to your Prim MD by signing hospital release before you go home.   If you experience worsening of your admission symptoms, develop shortness of breath, life threatening emergency, suicidal or homicidal thoughts you must seek medical attention immediately by calling 911 or calling your MD immediately  if symptoms less severe.  You Must read complete instructions/literature along with all the possible adverse reactions/side effects for all the Medicines you take and that have been prescribed to you. Take any new Medicines after you have completely understood and accpet all the possible adverse reactions/side effects.   Do not drive, operating heavy machinery, perform activities at heights, swimming or participation in water activities or provide baby sitting services if your were admitted for syncope or siezures until you have seen by Primary MD or a Neurologist and advised to do so again.  Do not drive when taking Pain medications.    Do not take more than prescribed Pain, Sleep and Anxiety Medications  Special Instructions: If you have smoked or chewed Tobacco  in the  last 2 yrs please stop smoking, stop any regular Alcohol  and or any Recreational drug use.  Wear Seat belts while driving.   Please note  You were cared for by a hospitalist during your hospital stay. If you have any questions about your discharge medications or the care you received while you were in the hospital after you are discharged, you can call the unit and asked to speak with the hospitalist on call if the hospitalist that took care of you is not available. Once you are discharged, your primary care physician will handle any further medical issues. Please note that NO REFILLS for any discharge medications will be authorized once you are discharged, as it is imperative that you return to your primary care physician (or establish a relationship with a primary care physician if you do not have one) for your aftercare needs so that they can reassess your need for medications and monitor your lab values.

## 2014-12-15 NOTE — Discharge Summary (Signed)
Jimmy Davis, is a 34 y.o. male  DOB 11-17-1980  MRN [id number].  Admission date:  12/14/2014  Admitting Physician  Starleen Arms, MD  Discharge Date:  12/15/2014   Primary MD  Norberto Sorenson, MD  Recommendations for primary care physician for things to follow:  - please check CBC, BMP during next visit.  Admission Diagnosis  Cellulitis of left lower extremity [L03.116]   Discharge Diagnosis  Cellulitis of left lower extremity [L03.116]    Principal Problem:   Cellulitis Active Problems:   Substance abuse      Past Medical History  Diagnosis Date  . Anxiety   . Depression   . Substance abuse   . Human immunodeficiency virus (HIV) disease     Past Surgical History  Procedure Laterality Date  . Wisdom tooth extraction         History of present illness and  Hospital Course:     Kindly see H&P for history of present illness and admission details, please review complete Labs, Consult reports and Test reports for all details in brief  HPI  from the history and physical done on the day of admission   Jimmy Davis is a 34 y.o. male With history of HIV, methamphetamine use, recent hospitalization on 6/1 for sepsis and cellulitis. He was admitted on IV vancomycin which began to improve his cellulitis. The patient had a CD4 percent counts drawn which was 19%, which was consistent from his percent counts in December 2015 and last year. He also had a duplex of his left lower extremity which revealed widely patent deep veins. He was discharged on doxycycline which he was taking as prescribed since his discharge on the third. His rash and swelling were fine for 2 or 3 days then his pain and redness increased again. Additionally, he began to have some swelling of his left foot which did not occur during the first incident. The patient denies missing doses, however he did use crystal meth last night  around 1 AM. Denies fevers, chills, nausea, abdominal pain, diarrhea. Movement and palpation increases pain. Rest decreases his pain.  Hospital Course   Left lower extremity cellulitis - Failed outpatient doxycycline, admitted and started on IV vancomycin , significant improvement overnight with almost total resolution of cellulitis , will be discharged today on oral Bactrim DS twice a day, and amoxicillin 500 three  times a day to finish total of 5 days .  HIV - Continue with home medication    Discharge Condition:   Follow UP  Follow-up Information    Follow up with SHAW,EVA, MD. Schedule an appointment as soon as possible for a visit in 1 week.   Specialty:  Family Medicine   Why:  Posthospitalization follow-up   Contact information:   [address] [place] [address] [phone]         Discharge Instructions  and  Discharge Medications    Discharge Instructions    Diet - low sodium heart healthy    Complete by:  As directed  Discharge instructions    Complete by:  As directed   Follow with Primary MD SHAW,EVA, MD in 7 days   Get CBC, CMP, 2 view Chest X ray checked  by Primary MD next visit.    Activity: As tolerated with Full fall precautions use walker/cane & assistance as needed   Disposition Home    Diet: Heart Healthy ** , with feeding assistance and aspiration precautions.  For Heart failure patients - Check your Weight same time everyday, if you gain over 2 pounds, or you develop in leg swelling, experience more shortness of breath or chest pain, call your Primary MD immediately. Follow Cardiac Low Salt Diet and 1.5 lit/day fluid restriction.   On your next visit with your primary care physician please Get Medicines reviewed and adjusted.   Please request your Prim.MD to go over all Hospital Tests and Procedure/Radiological results at the follow up, please get all Hospital records sent to your Prim MD by signing hospital release before you  go home.   If you experience worsening of your admission symptoms, develop shortness of breath, life threatening emergency, suicidal or homicidal thoughts you must seek medical attention immediately by calling 911 or calling your MD immediately  if symptoms less severe.  You Must read complete instructions/literature along with all the possible adverse reactions/side effects for all the Medicines you take and that have been prescribed to you. Take any new Medicines after you have completely understood and accpet all the possible adverse reactions/side effects.   Do not drive, operating heavy machinery, perform activities at heights, swimming or participation in water activities or provide baby sitting services if your were admitted for syncope or siezures until you have seen by Primary MD or a Neurologist and advised to do so again.  Do not drive when taking Pain medications.    Do not take more than prescribed Pain, Sleep and Anxiety Medications  Special Instructions: If you have smoked or chewed Tobacco  in the last 2 yrs please stop smoking, stop any regular Alcohol  and or any Recreational drug use.  Wear Seat belts while driving.   Please note  You were cared for by a hospitalist during your hospital stay. If you have any questions about your discharge medications or the care you received while you were in the hospital after you are discharged, you can call the unit and asked to speak with the hospitalist on call if the hospitalist that took care of you is not available. Once you are discharged, your primary care physician will handle any further medical issues. Please note that NO REFILLS for any discharge medications will be authorized once you are discharged, as it is imperative that you return to your primary care physician (or establish a relationship with a primary care physician if you do not have one) for your aftercare needs so that they can reassess your need for medications and  monitor your lab values.     Increase activity slowly    Complete by:  As directed             Medication List    STOP taking these medications        aspirin-acetaminophen-caffeine 250-250-65 MG per tablet  Commonly known as:  EXCEDRIN MIGRAINE     doxycycline 100 MG tablet  Commonly known as:  VIBRA-TABS      TAKE these medications        amoxicillin 500 MG capsule  Commonly known as:  AMOXIL  Take  1 capsule (500 mg total) by mouth 3 (three) times daily.     CALCIUM-MAGNESIUM-ZINC PO  Take 3 tablets by mouth 3 (three) times a week.     elvitegravir-cobicistat-emtricitabine-tenofovir 150-150-200-300 MG Tabs tablet  Commonly known as:  STRIBILD  Take 1 tablet by mouth daily with breakfast.     FLUoxetine 20 MG capsule  Commonly known as:  PROZAC  Take 60 mg by mouth daily.     L-ARGININE PO  Take 1 tablet by mouth 3 (three) times a week.     loratadine 10 MG tablet  Commonly known as:  CLARITIN  Take 10 mg by mouth daily as needed for allergies.     multivitamin capsule  Take 1 capsule by mouth daily.     POTASSIUM PO  Take 1 capsule by mouth See admin instructions. Three times per week     promethazine 12.5 MG tablet  Commonly known as:  PHENERGAN  Take 1 tablet (12.5 mg total) by mouth every 6 (six) hours as needed for nausea or vomiting.     sulfamethoxazole-trimethoprim 800-160 MG per tablet  Commonly known as:  BACTRIM DS,SEPTRA DS  Take 1 tablet by mouth 2 (two) times daily.     traMADol 50 MG tablet  Commonly known as:  ULTRAM  Take 1 tablet (50 mg total) by mouth every 8 (eight) hours as needed for severe pain.          Diet and Activity recommendation: See Discharge Instructions above   Consults obtained -  None   Major procedures and Radiology Reports - PLEASE review detailed and final reports for all details, in brief -      No results found.  Micro Results     Recent Results (from the past 240 hour(s))  Culture, blood  (routine x 2)     Status: None   Collection Time: 12/07/14  7:00 PM  Result Value Ref Range Status   Specimen Description BLOOD LEFT ARM  Final   Special Requests BOTTLES DRAWN AEROBIC ONLY 6CC  Final   Culture   Final    NO GROWTH 5 DAYS Performed at [facility]    Report Status 12/14/2014 FINAL  Final  Culture, blood (routine x 2)     Status: None   Collection Time: 12/07/14  7:00 PM  Result Value Ref Range Status   Specimen Description BLOOD RIGHT ARM  Final   Special Requests BOTTLES DRAWN AEROBIC AND ANAEROBIC   Final   Culture   Final    NO GROWTH 5 DAYS Performed at [facility]    Report Status 12/14/2014 FINAL  Final  Urine culture     Status: None   Collection Time: 12/08/14 12:42 AM  Result Value Ref Range Status   Specimen Description URINE, RANDOM  Final   Special Requests NONE  Final   Colony Count NO GROWTH Performed at [facility]   Final   Culture NO GROWTH Performed at [facility]   Final   Report Status 12/09/2014 FINAL  Final       Today   Subjective:   Jimmy Davis today has no headache,no chest abdominal pain,no new weakness tingling or numbness, feels much betttoday. Blood pressure 123/73, pulse 68, temperature 97.3 F (36.3 C), temperature source Oral, resp. rate 20, height  (1.753 m), weight 93.214 kg (205 lb 8 oz), SpO2 99 %.   Intake/Output Summary (Last 24 hours) at 12/15/14 1312 Last data filed at  12/15/14 0902  Gross per 24 hour  Intake   2996 ml  Output   5325 ml  Net  -2329 ml    Exam Awake Alert, Oriented x 3, No new F.N deficits, Normal affect [place].AT,PERRAL Supple Neck,No JVD, No cervical lymphadenopathy appriciated.  Symmetrical Chest wall movement, Good air movement bilaterally, CTAB RRR,No Gallops,Rubs or new Murmurs, No Parasternal Heave +ve B.Sounds, Abd Soft, Non tender, No organomegaly appriciated, No rebound -guarding or rigidity. No Cyanosis, Clubbing or edema, No new  Rash or bruise, left lower extremity cellulitis almost resolved (significant improvement over the last 24 hours ).  Data Review   CBC w Diff: Lab Results  Component Value Date   WBC 5.9 12/15/2014   WBC 13.2* 12/07/2014   HGB 12.3* 12/15/2014   HGB 14.0* 12/07/2014   HCT 36.5* 12/15/2014   HCT 41.3* 12/07/2014   PLT 225 12/15/2014   LYMPHOPCT 27 12/14/2014   MONOPCT 8 12/14/2014   EOSPCT 2 12/14/2014   BASOPCT 0 12/14/2014    CMP: Lab Results  Component Value Date   NA 139 12/15/2014   K 3.7 12/15/2014   CL 105 12/15/2014   CO2 27 12/15/2014   BUN 12 12/15/2014   CREATININE 0.83 12/15/2014   CREATININE 1.01 12/07/2014   PROT 7.5 12/07/2014   ALBUMIN 4.7 12/07/2014   BILITOT 0.7 12/07/2014   ALKPHOS 109 12/07/2014   AST 29 12/07/2014   ALT 44 12/07/2014  .   Total Time in preparing paper work, data evaluation and todays exam - 35 minutes  Daisja Kessinger M.D on 12/15/2014 at 1:12 PM  [facility]   Office  [phone]

## 2014-12-15 NOTE — Progress Notes (Signed)
Discharge instructions reviewed.  Patient verbalized understanding, signend document and left with prescriptions for Amoxicillin and Bactrim.

## 2014-12-15 NOTE — Progress Notes (Addendum)
CSW received referral that pt concerned about hospital bill.   CSW reviewed chart and noted that pt does not have insurance. CSW contacted Charlestine Night and left voice message to ensure financial counselor was scheduled to see pt.   Inappropriate CSW referral.  CSW signing off.   Please re-consult if social work needs arise.   Addendum 12:29 pm:   CSW received notification from financial counselor that pt was in hospital last week and seen by financial counselor at that time.   CSW signing off.   Loletta Specter, MSW, LCSW Clinical Social Work [phone]

## 2014-12-24 ENCOUNTER — Ambulatory Visit: Payer: Self-pay

## 2014-12-26 ENCOUNTER — Encounter: Payer: Self-pay | Admitting: Internal Medicine

## 2014-12-26 ENCOUNTER — Ambulatory Visit (INDEPENDENT_AMBULATORY_CARE_PROVIDER_SITE_OTHER): Payer: Self-pay | Admitting: Internal Medicine

## 2014-12-26 VITALS — BP 126/91 | HR 125 | Temp 97.8°F | Wt 205.0 lb

## 2014-12-26 DIAGNOSIS — B20 Human immunodeficiency virus [HIV] disease: Secondary | ICD-10-CM

## 2014-12-26 DIAGNOSIS — Z872 Personal history of diseases of the skin and subcutaneous tissue: Secondary | ICD-10-CM

## 2014-12-26 DIAGNOSIS — F1910 Other psychoactive substance abuse, uncomplicated: Secondary | ICD-10-CM

## 2014-12-26 LAB — CBC WITH DIFFERENTIAL/PLATELET
BASOS ABS: 0 10*3/uL (ref 0.0–0.1)
BASOS PCT: 0 % (ref 0–1)
EOS ABS: 0.2 10*3/uL (ref 0.0–0.7)
Eosinophils Relative: 3 % (ref 0–5)
HEMATOCRIT: 39.6 % (ref 39.0–52.0)
Hemoglobin: 13.6 g/dL (ref 13.0–17.0)
Lymphocytes Relative: 43 % (ref 12–46)
Lymphs Abs: 3.2 10*3/uL (ref 0.7–4.0)
MCH: 30.4 pg (ref 26.0–34.0)
MCHC: 34.3 g/dL (ref 30.0–36.0)
MCV: 88.4 fL (ref 78.0–100.0)
MONOS PCT: 9 % (ref 3–12)
MPV: 8.8 fL (ref 8.6–12.4)
Monocytes Absolute: 0.7 10*3/uL (ref 0.1–1.0)
NEUTROS PCT: 45 % (ref 43–77)
Neutro Abs: 3.4 10*3/uL (ref 1.7–7.7)
PLATELETS: 276 10*3/uL (ref 150–400)
RBC: 4.48 MIL/uL (ref 4.22–5.81)
RDW: 13.7 % (ref 11.5–15.5)
WBC: 7.5 10*3/uL (ref 4.0–10.5)

## 2014-12-26 LAB — COMPLETE METABOLIC PANEL WITH GFR
ALT: 50 U/L (ref 0–53)
AST: 32 U/L (ref 0–37)
Albumin: 4.6 g/dL (ref 3.5–5.2)
Alkaline Phosphatase: 87 U/L (ref 39–117)
BILIRUBIN TOTAL: 0.5 mg/dL (ref 0.2–1.2)
BUN: 14 mg/dL (ref 6–23)
CO2: 24 meq/L (ref 19–32)
Calcium: 9.1 mg/dL (ref 8.4–10.5)
Chloride: 97 mEq/L (ref 96–112)
Creat: 0.97 mg/dL (ref 0.50–1.35)
GFR, Est Non African American: >89 mL/min
Glucose, Bld: 92 mg/dL (ref 70–99)
Potassium: 3.8 mEq/L (ref 3.5–5.3)
Sodium: 136 mEq/L (ref 135–145)
Total Protein: 7.5 g/dL (ref 6.0–8.3)

## 2014-12-26 NOTE — Progress Notes (Signed)
Patient ID: Jimmy Davis, male   DOB: 09-May-1981, 34 y.o.   MRN: [id number]       Patient ID: Jimmy Davis, male   DOB: 1981/04/08, 34 y.o.   MRN: [id number]  HPI Cd 4 count of 460/LV<20 on Dec 2015. Recently hospitalized for left leg cellulitis. initially seen at urgent care, given doxycycline but failed to respond. He subsequently was hospitalized nad discharged on amox plus bactrim where his cellulitis improved. He only has trace edema to ankle now, no areas of tenderness. He remains taking his hiv meds regularly. heis counseling [facility] changed. So he is looking for a Therapist, nutritional. stil looking for a job  Outpatient Encounter Prescriptions as of 12/26/2014  Medication Sig  . CALCIUM-MAGNESIUM-ZINC PO Take 3 tablets by mouth 3 (three) times a week.  . elvitegravir-cobicistat-emtricitabine-tenofovir (STRIBILD) 150-150-200-300 MG TABS tablet Take 1 tablet by mouth daily with breakfast.  . FLUoxetine (PROZAC) 20 MG capsule Take 60 mg by mouth daily.  . L-ARGININE PO Take 1 tablet by mouth 3 (three) times a week.  . loratadine (CLARITIN) 10 MG tablet Take 10 mg by mouth daily as needed for allergies.  Marland Kitchen amoxicillin (AMOXIL) 500 MG capsule Take 1 capsule (500 mg total) by mouth 3 (three) times daily. (Patient not taking: Reported on 12/24/2014)  . Multiple Vitamin (MULTIVITAMIN) capsule Take 1 capsule by mouth daily.  Marland Kitchen POTASSIUM PO Take 1 capsule by mouth See admin instructions. Three times per week  . promethazine (PHENERGAN) 12.5 MG tablet Take 1 tablet (12.5 mg total) by mouth every 6 (six) hours as needed for nausea or vomiting. (Patient not taking: Reported on 12/14/2014)  . traMADol (ULTRAM) 50 MG tablet Take 1 tablet (50 mg total) by mouth every 8 (eight) hours as needed for severe pain. (Patient not taking: Reported on 12/24/2014)   No facility-administered encounter medications on file as of 12/26/2014.     Patient Active Problem List   Diagnosis Date Noted  .  Cellulitis of left lower extremity 12/07/2014  . Sepsis 12/07/2014  . Cellulitis 12/07/2014  . Substance abuse 10/03/2014  . Depression 10/03/2014  . Chronic diarrhea 07/27/2013  . Human immunodeficiency virus (HIV) disease 06/29/2013     Health Maintenance Due  Topic Date Due  . TETANUS/TDAP  07/02/1998     Review of Systems Positive pertinents listed in hpi Physical Exam   BP 126/91 mmHg  Pulse 125  Temp(Src) 97.8 F (36.6 C) (Oral)  Wt 205 lb (92.987 kg) Physical Exam  Constitutional: He is oriented to person, place, and time. He appears well-developed and well-nourished. No distress.  HENT:  Mouth/Throat: Oropharynx is clear and moist. No oropharyngeal exudate.  Ext: trace edema on left leg Skin: Skin is warm and dry. No rash noted. No erythema.  Psychiatric: He has a normal mood and affect. His behavior is normal.    Lab Results  Component Value Date   CD4TCELL 19* 06/16/2014   Lab Results  Component Value Date   CD4TABS 460 06/16/2014   CD4TABS 540 12/07/2013   CD4TABS 460 08/17/2013   Lab Results  Component Value Date   HIV1RNAQUANT <20 06/16/2014   Lab Results  Component Value Date   HEPBSAB NEG 08/17/2013   No results found for: RPR  CBC Lab Results  Component Value Date   WBC 5.9 12/15/2014   RBC 4.08* 12/15/2014   HGB 12.3* 12/15/2014   HCT 36.5* 12/15/2014   PLT 225 12/15/2014   MCV 89.5 12/15/2014   MCH  30.1 12/15/2014   MCHC 33.7 12/15/2014   RDW 12.9 12/15/2014   LYMPHSABS 3.1 12/14/2014   MONOABS 1.0 12/14/2014   EOSABS 0.2 12/14/2014   BASOSABS 0.0 12/14/2014   BMET Lab Results  Component Value Date   NA 139 12/15/2014   K 3.7 12/15/2014   CL 105 12/15/2014   CO2 27 12/15/2014   GLUCOSE 97 12/15/2014   BUN 12 12/15/2014   CREATININE 0.83 12/15/2014   CALCIUM 8.4* 12/15/2014   GFRNONAA >60 12/15/2014   GFRAA >60 12/15/2014     Assessment and Plan  hiv disease = well controlled, will check labs  Cellulitis  =resolved but will have him call us if it recurs, to consider repeat trial with keflex  Substance abuse = encourage to seek out new counselor

## 2014-12-27 ENCOUNTER — Telehealth: Payer: Self-pay | Admitting: Licensed Clinical Social Worker

## 2014-12-27 DIAGNOSIS — L03116 Cellulitis of left lower limb: Secondary | ICD-10-CM

## 2014-12-27 LAB — RPR

## 2014-12-27 NOTE — Telephone Encounter (Signed)
Patient called stating that his left leg is swollen, no pain, not warm to touch, slight redness and slight stiffness. Symptoms started last night. Please advise

## 2014-12-28 LAB — HIV-1 RNA QUANT-NO REFLEX-BLD: HIV-1 RNA Quant, Log: <1.3 {Log} (ref <1.30)

## 2014-12-29 ENCOUNTER — Ambulatory Visit: Payer: Self-pay | Admitting: *Deleted

## 2014-12-29 LAB — T-HELPER CELL (CD4) - (RCID CLINIC ONLY)
CD4 % Helper T Cell: 21 % — ABNORMAL LOW (ref 33–55)
CD4 T CELL ABS: 750 /uL (ref 400–2700)

## 2014-12-29 NOTE — Telephone Encounter (Signed)
Left message on patient's voicemail to let us know if the redness has worsened

## 2014-12-29 NOTE — Telephone Encounter (Signed)
Can you call him today to see how he is doing? If redness is worse, can you give him keflex 500mg  QID x 7days. If not worsening redness, keep his leg raised today to see if that helps

## 2015-01-04 MED ORDER — CEPHALEXIN 500 MG PO CAPS: 500.0000 mg | ORAL_CAPSULE | Freq: Four times a day (QID) | ORAL | Status: AC

## 2015-01-04 NOTE — Addendum Note (Signed)
Addended by: Jennet MaduroESTRIDGE, Asaiah Scarber D on: 01/04/2015 09:10 AM   Modules accepted: Orders, Medications

## 2015-01-04 NOTE — Telephone Encounter (Signed)
Leg sore, warm and red now per the pt.  RN reviewed the notes below.  Will follow Dr. Feliz BeamSnider's order and send in the rx to the pt's pharmacy.

## 2015-01-06 ENCOUNTER — Telehealth: Payer: Self-pay | Admitting: *Deleted

## 2015-01-06 NOTE — Telephone Encounter (Signed)
Cellulitis improving per patient.  Area is not as sore, not as ready and the size has decreased to small dots.  Wanted his MD to know.  Still has a few doses of medication to complete.

## 2015-01-28 ENCOUNTER — Encounter: Payer: Self-pay | Admitting: Internal Medicine

## 2015-01-29 ENCOUNTER — Telehealth: Payer: Self-pay | Admitting: Infectious Diseases

## 2015-01-29 NOTE — Telephone Encounter (Signed)
Pt called concerned that his previous cellulitis is returning.  He was unable to remember his prev rx. I called in 10 days of amoxil for him.  He will call clinic on 7-25 for appt

## 2015-03-30 ENCOUNTER — Ambulatory Visit (INDEPENDENT_AMBULATORY_CARE_PROVIDER_SITE_OTHER): Payer: Self-pay | Admitting: Internal Medicine

## 2015-03-30 ENCOUNTER — Encounter: Payer: Self-pay | Admitting: Internal Medicine

## 2015-03-30 VITALS — BP 125/83 | HR 96 | Temp 98.0°F | Ht 69.0 in | Wt 218.0 lb

## 2015-03-30 DIAGNOSIS — B20 Human immunodeficiency virus [HIV] disease: Secondary | ICD-10-CM

## 2015-03-30 DIAGNOSIS — Z113 Encounter for screening for infections with a predominantly sexual mode of transmission: Secondary | ICD-10-CM

## 2015-03-30 DIAGNOSIS — Z23 Encounter for immunization: Secondary | ICD-10-CM

## 2015-03-30 DIAGNOSIS — F32A Depression, unspecified: Secondary | ICD-10-CM

## 2015-03-30 DIAGNOSIS — F329 Major depressive disorder, single episode, unspecified: Secondary | ICD-10-CM

## 2015-03-30 MED ORDER — FLUOXETINE HCL 20 MG PO CAPS: 60.0000 mg | ORAL_CAPSULE | Freq: Every day | ORAL | Status: DC

## 2015-03-30 NOTE — Progress Notes (Signed)
Patient ID: Jimmy Davis, male   DOB: [birth year], 34 y.o.   MRN: [id number]       Patient ID: Jimmy Davis, male   DOB: [birth year], 34 y.o.   MRN: [id number]  HPI 34yo M with HIV disease, depression, recurrent cellulitis, hx of illicit drug use. Getting counseling. CD 4 count of 750/VL<20 on stribild. 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 He is able to keep up with taking hiv medication. No injection drug use. He used to seek counseling at [facility] in the past, received prozac  however, he has not had a new counselor, feels no connection.   He is interested in seeking counseling here.  Outpatient Encounter Prescriptions as of 03/30/2015  Medication Sig  . elvitegravir-cobicistat-emtricitabine-tenofovir (STRIBILD) 150-150-200-300 MG TABS tablet Take 1 tablet by mouth daily with breakfast.  . FLUoxetine (PROZAC) 20 MG capsule Take 60 mg by mouth daily.  . Multiple Vitamin (MULTIVITAMIN) capsule Take 1 capsule by mouth daily.  Marland Kitchen loratadine (CLARITIN) 10 MG tablet Take 10 mg by mouth daily as needed for allergies.  Marland Kitchen traMADol (ULTRAM) 50 MG tablet Take 1 tablet (50 mg total) by mouth every 8 (eight) hours as needed for severe pain. (Patient not taking: Reported on 12/24/2014)  . [DISCONTINUED] amoxicillin (AMOXIL) 500 MG capsule Take 1 capsule (500 mg total) by mouth 3 (three) times daily. (Patient not taking: Reported on 12/24/2014)  . [DISCONTINUED] CALCIUM-MAGNESIUM-ZINC PO Take 3 tablets by mouth 3 (three) times a week.  . [DISCONTINUED] L-ARGININE PO Take 1 tablet by mouth 3 (three) times a week.  . [DISCONTINUED] POTASSIUM PO Take 1 capsule by mouth See admin instructions. Three times per week  . [DISCONTINUED] promethazine (PHENERGAN) 12.5 MG tablet Take 1 tablet (12.5 mg total) by mouth every 6 (six) hours as needed for nausea or vomiting. (Patient not taking: Reported on 12/14/2014)     No facility-administered encounter medications on file as of 03/30/2015.     Patient Active Problem List   Diagnosis Date Noted  . Cellulitis of left lower extremity 12/07/2014  . Sepsis 12/07/2014  . Cellulitis 12/07/2014  . Substance abuse 10/03/2014  . Depression 10/03/2014  . Chronic diarrhea 07/27/2013  . Human immunodeficiency virus (HIV) disease 06/29/2013     Health Maintenance Due  Topic Date Due  . TETANUS/TDAP  07/02/1998  . INFLUENZA VACCINE  02/06/2015     Review of Systems + nasal congestion, coincides with drug use .    Physical Exam   BP 125/83 mmHg  Pulse 96  Temp(Src) 98 F (36.7 C) (Oral)  Ht  (1.753 m)  Wt 218 lb (98.884 kg)  BMI 32.18 kg/m2 Physical Exam  Constitutional: He is oriented to person, place, and time. He appears well-developed and well-nourished. No distress.  HENT:  Mouth/Throat: Oropharynx is clear and moist. No oropharyngeal exudate.  Cardiovascular: Normal rate, regular rhythm and normal heart sounds. Exam reveals no gallop and no friction rub.  No murmur heard.  Pulmonary/Chest: Effort normal and breath sounds normal. No respiratory distress. He has no wheezes.  Abdominal: Soft. Bowel sounds are normal. He exhibits no distension. There is no tenderness.  Lymphadenopathy:  He has no cervical adenopathy.  Neurological: He is alert and oriented to person, place, and time.  Skin: Skin is warm and dry. No rash noted. No erythema.  Psychiatric: He has a normal mood and affect. His behavior is normal.  Lab Results  Component Value Date   CD4TCELL 21* 12/27/2014   Lab Results  Component Value Date   CD4TABS 750 12/27/2014   CD4TABS 460 06/16/2014   CD4TABS 540 12/07/2013   Lab Results  Component Value Date   HIV1RNAQUANT <20 12/26/2014   Lab Results  Component Value Date   HEPBSAB NEG 08/17/2013   No results found for: RPR  CBC Lab Results  Component Value Date   WBC 7.5 12/26/2014   RBC 4.48 12/26/2014    HGB 13.6 12/26/2014   HCT 39.6 12/26/2014   PLT 276 12/26/2014   MCV 88.4 12/26/2014   MCH 30.4 12/26/2014   MCHC 34.3 12/26/2014   RDW 13.7 12/26/2014   LYMPHSABS 3.2 12/26/2014   MONOABS 0.7 12/26/2014   EOSABS 0.2 12/26/2014   BASOSABS 0.0 12/26/2014   BMET Lab Results  Component Value Date   NA 136 12/26/2014   K 3.8 12/26/2014   CL 97 12/26/2014   CO2 24 12/26/2014   GLUCOSE 92 12/26/2014   BUN 14 12/26/2014   CREATININE 0.97 12/26/2014   CALCIUM 9.1 12/26/2014   GFRNONAA >89 12/26/2014   GFRAA >89 12/26/2014     Assessment and Plan  hiv disease =  Continue on stribild  Depression = will refill prozac  daily  Meth use = will set up warm hand off with jodie to initiate counseling. Patient is interested in substance abuse counseling  Rhinorrhea = likely from illicit drug use can use mucinex or saline nasal spray  Health maintenance = flu shot today  Cellulitis = resolved

## 2015-03-31 DIAGNOSIS — Z23 Encounter for immunization: Secondary | ICD-10-CM

## 2015-03-31 LAB — T-HELPER CELL (CD4) - (RCID CLINIC ONLY)
CD4 % Helper T Cell: 26 % — ABNORMAL LOW (ref 33–55)
CD4 T CELL ABS: 850 /uL (ref 400–2700)

## 2015-03-31 LAB — RPR

## 2015-04-03 LAB — HIV-1 RNA QUANT-NO REFLEX-BLD
HIV 1 RNA Quant: <20 copies/mL (ref <20)
HIV-1 RNA Quant, Log: <1.3 {Log} (ref <1.30)

## 2015-04-04 ENCOUNTER — Encounter: Payer: Self-pay | Admitting: *Deleted

## 2015-04-04 DIAGNOSIS — F329 Major depressive disorder, single episode, unspecified: Secondary | ICD-10-CM

## 2015-04-04 DIAGNOSIS — F32A Depression, unspecified: Secondary | ICD-10-CM

## 2015-04-04 NOTE — BH Specialist Note (Signed)
Counselor was asked to meet with Jimmy Davis in the exam room for a warm handoff as a result of depression like symptoms.  Counselor introduced self and services related to counseling.  Client was oriented times four with good affect and dress.  Client was alert and talkative and denied any suicidal ideation at this time.  Client agreed that he needed to see someone and start getting help with his addiction.  Client reported smoking meth on a regular basis.  Client agreed that he needed treatment.  Counselor encouraged client to make an appointment with counselor to explore his addiction and substance use.   Jenel Lucks, LPCA, MA [facility]

## 2015-04-11 ENCOUNTER — Ambulatory Visit: Payer: Self-pay | Admitting: *Deleted

## 2015-04-11 DIAGNOSIS — F1510 Other stimulant abuse, uncomplicated: Secondary | ICD-10-CM

## 2015-04-11 DIAGNOSIS — F32A Depression, unspecified: Secondary | ICD-10-CM

## 2015-04-11 DIAGNOSIS — F329 Major depressive disorder, single episode, unspecified: Secondary | ICD-10-CM

## 2015-04-11 NOTE — BH Specialist Note (Signed)
Jimmy Davis was present for his scheduled appointment today. Client was oriented times four with good affect and dress. Client was alert but a bit nervous. Client communicated that he had used today prior to coming. Counselor explained to client that if he show up for the appointments high, he would not be allowed to stay. Client agreed and understood. Client shared that he had used several hours before coming and was not truly high. Counselor questioned this and asked what he had taken. Client indicated that he had smoked a little meth around 5 am. Client was fidgety but otherwise coherent to what was being said and discussed. Client openly and honestly talked offering feedback and his own insight to why he is in the predicament he is currently in. Client discussed with counselor how the substance abuse started and how it has evolved over the years. Client engaged in all topic discussions and invested himself fully. Client agreed that he needed counseling and a safe place to share freely. Client made another appointment with counselor for two weeks out. Counselor provided support and encouragement for client during the fact gathering process.  Jenel Lucks, LPCA, MA [facility]

## 2015-04-25 ENCOUNTER — Ambulatory Visit: Payer: Self-pay | Admitting: *Deleted

## 2015-04-25 DIAGNOSIS — F1510 Other stimulant abuse, uncomplicated: Secondary | ICD-10-CM

## 2015-04-26 NOTE — BH Specialist Note (Signed)
[name] was present for his scheduled appointment today.  Client was oriented times four with good affect dress.  Client indicated that he used earlier today.  Counselor shared with client that he could not come to his appointments high or on any unprescribed substances. Client agreed and said he understood.  Client shared that he was doing ok but continuing to use meth on a pretty regular basis.  Client talked about having to keep the information about his substance abuse away from his mother because she gave him money to help live on and if she knew he was using again she would deny him that money. Client said that furthermore his mother had lost her husband  A few months ago and does not want her to be burdened down with more negative news. Counselor educated client on the importance of having a strong support network while in recovery.  Client shared that his father was an alcoholic and his mother divorced him because he would not give up the bottle.  Counselor talked with client about a predisposition towards addictive personality's. Client was attentive today and invested himself in conversation about his addiction and abuse.  Counselor made another appointment with client for next week.  Counselor encourage client to consider outpatient substance abuse treatment.  Client said he would.   Jenel LucksJodi Phyliss Hulick, LPCA, MA [facility]

## 2015-05-02 ENCOUNTER — Ambulatory Visit: Payer: Self-pay | Admitting: *Deleted

## 2015-05-02 DIAGNOSIS — F1510 Other stimulant abuse, uncomplicated: Secondary | ICD-10-CM

## 2015-05-02 NOTE — BH Specialist Note (Signed)
Jimmy Davis was present for his scheduled appointment today with counselor.  Client communicated that he was doing ok today.  Client was oriented times four with good affect and dress.  Client was alert and talkative. Client reported that he had used meth since last session together.  Client indicated that he had gone and did the tri[facility].  Client said that he has an intake appointment in another week.  Client discussed a little more in depth about his childhood and the deaths of his father and step fathers through the years.  Client shared that he lost his biological father when he was just 89 years old, lost his mom's 2nd husband when he was 34 years old, and his mom's third husband little less than two years ago to cancer. Client feels that issues with abandonment has something to do with these experiences. Counselor educated client on how the fear of abandonment can sabotage our relationships.  Counselor shared with client that would benefit from building new skills for self reliance and working towards empowerment as an individual.  Client agreed this was needed.  Counselor discussed with client radical self acceptance.  Client said that he feels he is able to open up more and more and is happy that he is starting to feel more comfortable in his own skin despite his circumstances. Counselor provided client support and encouragement accordingly. Another appointment was made for next week.   Jenel LucksJodi Carlos Heber, MA, LPCA [facility]

## 2015-05-11 ENCOUNTER — Ambulatory Visit: Payer: Self-pay | Admitting: *Deleted

## 2015-05-11 DIAGNOSIS — F1520 Other stimulant dependence, uncomplicated: Secondary | ICD-10-CM

## 2015-05-11 NOTE — BH Specialist Note (Signed)
[name] was present for his scheduled appointment today with counselor.  Client was oriented times four with good affect and dress.  Client was a bit nervous acting as evidenced by fidgeting and moving around in his seat often.  Counselor asked client if he was nervous or if he was coming down off meth.  Client indicated both were the reason for his constant movement.  Client shared that he had not stop using completely but desires to do so.  Client shared that he had gone for his triage appointment with [facility] and was scheduled to complete the intake process next week. Client stated that he felt better mentally and was ready to go through the process of recovery via the substance abuse treatment class at [facility].  Counselor educated client today on self regulation. Counselor shared with client that the ability to control and monitor our personal behavior as well as being able to make adjustments according to situations had an enormous affect on our addiction.  Counselor provided support and encouragement and recommended that client follow through with the treatment classes to work on his addiction more intensely. Client agreed that he would.    Jenel LucksJodi Jobani Sabado, MA, LPCA [facility]

## 2015-05-24 ENCOUNTER — Ambulatory Visit: Payer: Self-pay | Admitting: *Deleted

## 2015-05-31 ENCOUNTER — Ambulatory Visit: Payer: Self-pay | Admitting: *Deleted

## 2015-06-24 ENCOUNTER — Emergency Department (HOSPITAL_COMMUNITY)
Admission: EM | Admit: 2015-06-24 | Discharge: 2015-06-25 | Disposition: A | Discharge status: Home / Self Care | Payer: Self-pay | Attending: Emergency Medicine | Admitting: Emergency Medicine

## 2015-06-24 ENCOUNTER — Encounter (HOSPITAL_COMMUNITY): Payer: Self-pay | Admitting: Oncology

## 2015-06-24 DIAGNOSIS — B20 Human immunodeficiency virus [HIV] disease: Secondary | ICD-10-CM | POA: Insufficient documentation

## 2015-06-24 DIAGNOSIS — R05 Cough: Secondary | ICD-10-CM | POA: Insufficient documentation

## 2015-06-24 DIAGNOSIS — Z79899 Other long term (current) drug therapy: Secondary | ICD-10-CM | POA: Insufficient documentation

## 2015-06-24 DIAGNOSIS — F419 Anxiety disorder, unspecified: Secondary | ICD-10-CM | POA: Insufficient documentation

## 2015-06-24 DIAGNOSIS — F1510 Other stimulant abuse, uncomplicated: Secondary | ICD-10-CM

## 2015-06-24 DIAGNOSIS — R059 Cough, unspecified: Secondary | ICD-10-CM

## 2015-06-24 DIAGNOSIS — F329 Major depressive disorder, single episode, unspecified: Secondary | ICD-10-CM | POA: Insufficient documentation

## 2015-06-24 DIAGNOSIS — K224 Dyskinesia of esophagus: Secondary | ICD-10-CM

## 2015-06-24 DIAGNOSIS — R0600 Dyspnea, unspecified: Secondary | ICD-10-CM | POA: Insufficient documentation

## 2015-06-24 NOTE — ED Provider Notes (Signed)
By signing my name below, I, Jimmy Davis, attest that this documentation has been prepared under the direction and in the presence of Jimmy Davis N Jimmy Humbarger, DO. Electronically Signed: Doreatha Davis, ED Scribe. 06/25/2015. 12:13 AM.   TIME SEEN: 12:00 AM   CHIEF COMPLAINT:  Chief Complaint  Patient presents with  . Dysphagia    HPI:  HPI Comments: Jimmy Davis is a 34 y.o. male with h/o anxiety, depression, substance abuse, HIV who presents to the Emergency Department complaining of difficulty breathing and choking on clear phlegm for the past 2 months. No chest pain. No fever. States tonight however he had an episode where he tried to swallow water and it would not stay down and he had to spit it out which is what concerned him and prompted him to come to the emergency department.  States he has not been able to swallow since. He is not sure if he is able to swallow his saliva as he reports his mouth is very dry after using methamphetamine sometime this afternoon. He has a history of methamphetamine use. States he normally starts that but he has not been smoking it. He states that he ate a sandwich, chips and a cookie at noon with no difficulty. Has not tried to eat anything since. Pt is a does not smoke tobacco. No h/o asthma, COPD, endoscopy, history of esophageal stricture. Pt is not followed by GI. He denies fever, CP, abdominal pain.  ID - Snider Patient's last viral load in September 2016 was nondetectable. CD4 count in June 2016 was 750. He is on Stribild reports compliance. Has follow-up with infectious disease 06/28/15.  ROS: See HPI Constitutional: no fever  Eyes: no drainage  ENT: Positive for congestion, difficulty swallowing  Cardiovascular:  no chest pain  Resp: + SOB. Positive for cough GI: no vomiting GU: no dysuria Integumentary: no rash  Allergy: no hives  Musculoskeletal: no leg swelling  Neurological: no slurred speech ROS otherwise negative  PAST MEDICAL HISTORY/PAST  SURGICAL HISTORY:  Past Medical History  Diagnosis Date  . Anxiety   . Depression   . Substance abuse   . Human immunodeficiency virus (HIV) disease (HCC)     MEDICATIONS:  Prior to Admission medications   Medication Sig Start Date End Date Taking? Authorizing Provider  elvitegravir-cobicistat-emtricitabine-tenofovir (STRIBILD) 150-150-200-300 MG TABS tablet Take 1 tablet by mouth daily with breakfast. 07/11/14   Judyann Munson, MD  FLUoxetine (PROZAC) 20 MG capsule Take 3 capsules (60 mg total) by mouth daily. 03/30/15   Judyann Munson, MD  loratadine (CLARITIN) 10 MG tablet Take 10 mg by mouth daily as needed for allergies.    Historical Provider, MD  Multiple Vitamin (MULTIVITAMIN) capsule Take 1 capsule by mouth daily.    Historical Provider, MD  traMADol (ULTRAM) 50 MG tablet Take 1 tablet (50 mg total) by mouth every 8 (eight) hours as needed for severe pain. Patient not taking: Reported on 12/24/2014 12/09/14   Ripudeep Jenna Luo, MD    ALLERGIES:  No Known Allergies  SOCIAL HISTORY:  Social History  Substance Use Topics  . Smoking status: Never Smoker   . Smokeless tobacco: Never Used  . Alcohol Use: No    FAMILY HISTORY: Family History  Problem Relation Age of Onset  . Heart disease Father   . Mental illness Maternal Grandmother     EXAM: BP 140/90 mmHg  Pulse 97  Temp(Src) 98 F (36.7 C) (Oral)  Resp 16  Ht  (1.778 m)  Wt  200 lb (90.719 kg)  BMI 28.70 kg/m2  SpO2 97% CONSTITUTIONAL: Alert and oriented and responds appropriately to questions. Well-appearing; well-nourished. Appears jittery.  HEAD: Normocephalic EYES: Conjunctivae clear, PERRL ENT: normal nose; no rhinorrhea; dry mucous membranes; pharynx without lesions noted; no tonsillar hypertrophy or exudate, no uvular deviation, no trismus or drooling, normal phonation, no stridor, no dental caries or abscess noted, no Ludwig's angina, tongue sits flat in the bottom of the mouth NECK: Supple, no  meningismus, no LAD  CARD: RRR; S1 and S2 appreciated; no murmurs, no clicks, no rubs, no gallops RESP: Normal chest excursion without splinting or tachypnea; breath sounds clear and equal bilaterally; no wheezes, no rhonchi, no rales, no hypoxia or respiratory distress, speaking full sentences ABD/GI: Normal bowel sounds; non-distended; soft, non-tender, no rebound, no guarding, no peritoneal signs BACK:  The back appears normal and is non-tender to palpation, there is no CVA tenderness EXT: Normal ROM in all joints; non-tender to palpation; no edema; normal capillary refill; no cyanosis, no calf tenderness or swelling    SKIN: Normal color for age and race; warm NEURO: Moves all extremities equally, sensation to light touch intact diffusely, cranial nerves II through XII intact PSYCH: The patient's mood and manner are appropriate. Grooming and personal hygiene are appropriate.  MEDICAL DECISION MAKING: Patient here with 2 separate complaints. He states he has had sinus drainage, cough and congestion for 1-2 months. Coughing up clear phlegm. Reports feeling short of breath because of this. Lungs are clear to auscultation. No respiratory distress or increased work of breathing. No hypoxia. EKG shows no ischemic changes. We'll obtain chest x-ray.  He states the main reason he came to the emergency because he had an episode where he cannot swallow water. Has never had any similar symptoms before. No history of endoscopy, esophageal stricture or spasm in the past. Last ate food around noon today. Has not even anything since. Has very dry mucous membranes on exam but this is likely secondary to recent methamphetamine use. Suspect patient may be having esophageal spasm. We'll give IV fluids, glucagon, nitroglycerin, zofran, Protonix and reassess.  ED PROGRESS: 3:15 AM  Pt's chest x-ray is clear. No pneumonia, infiltrate, edema, pneumothorax. After above medications he is not able to swallow without any  difficulty reports feeling much better. Discussed with patient that this may have been esophageal spasm from recent methamphetamine use. Have advised him to stop using methamphetamine. He is currently in treatment for the same. He is able to drink without any difficulty and is ready for discharge home. Has follow-up with his infectious disease physician in several days. Discussed return precautions. He verbalizes understanding and is comfort with this plan.     EKG Interpretation  Date/Time:  Saturday June 24 2015 23:51:14 EST Ventricular Rate:  80 PR Interval:  168 QRS Duration: 98 QT Interval:  370 QTC Calculation: 427 R Axis:   8 Text Interpretation:  Sinus rhythm No old tracing to compare Confirmed by Carey Lafon,  DO, Kaoru Rezendes ([id number]) on 06/25/2015 12:20:22 AM       I personally performed the services described in this documentation, which was scribed in my presence. The recorded information has been reviewed and is accurate.   Layla MawKristen N Kimya Mccahill, DO 06/25/15 [phone]

## 2015-06-24 NOTE — ED Notes (Addendum)
Pt reports cough, congestion, sinus drainage x 1 month.  Today pt is having difficulty swallowing.  Per pt if he takes a sip of water, "it comes right up."  Pt is A&O x 4.  NAD.  Pt reports smoking meth today and feels as if he is coming down.

## 2015-06-25 ENCOUNTER — Emergency Department (HOSPITAL_COMMUNITY): Payer: Self-pay

## 2015-06-25 MED ORDER — PANTOPRAZOLE SODIUM 40 MG IV SOLR
40.0000 mg | Freq: Once | INTRAVENOUS | Status: AC
  Administered 2015-06-25: 40 mg via INTRAVENOUS
  Filled 2015-06-25: qty 40

## 2015-06-25 MED ORDER — NITROGLYCERIN 0.4 MG SL SUBL
0.4000 mg | SUBLINGUAL_TABLET | Freq: Once | SUBLINGUAL | Status: AC
  Administered 2015-06-25: 0.4 mg via SUBLINGUAL
  Filled 2015-06-25: qty 1

## 2015-06-25 MED ORDER — ONDANSETRON HCL 4 MG/2ML IJ SOLN
4.0000 mg | Freq: Once | INTRAMUSCULAR | Status: AC
  Administered 2015-06-25: 4 mg via INTRAVENOUS
  Filled 2015-06-25: qty 2

## 2015-06-25 MED ORDER — SODIUM CHLORIDE 0.9 % IV BOLUS (SEPSIS)
1000.0000 mL | Freq: Once | INTRAVENOUS | Status: AC
  Administered 2015-06-25: 1000 mL via INTRAVENOUS

## 2015-06-25 MED ORDER — GLUCAGON HCL RDNA (DIAGNOSTIC) 1 MG IJ SOLR
1.0000 mg | Freq: Once | INTRAMUSCULAR | Status: AC
  Administered 2015-06-25: 1 mg via INTRAVENOUS
  Filled 2015-06-25: qty 1

## 2015-06-25 NOTE — ED Notes (Signed)
Writer provided pt with a diet coke for fluid challenge

## 2015-06-25 NOTE — ED Notes (Signed)
Patient transported to X-ray 

## 2015-06-25 NOTE — Discharge Instructions (Signed)
Cough, Adult Coughing is a reflex that clears your throat and your airways. Coughing helps to heal and protect your lungs. It is normal to cough occasionally, but a cough that happens with other symptoms or lasts a long time may be a sign of a condition that needs treatment. A cough may last only 2-3 weeks (acute), or it may last longer than 8 weeks (chronic). CAUSES Coughing is commonly caused by:  Breathing in substances that irritate your lungs.  A viral or bacterial respiratory infection.  Allergies.  Asthma.  Postnasal drip.  Smoking.  Acid backing up from the stomach into the esophagus (gastroesophageal reflux).  Certain medicines.  Chronic lung problems, including COPD (or rarely, lung cancer).  Other medical conditions such as heart failure. HOME CARE INSTRUCTIONS  Pay attention to any changes in your symptoms. Take these actions to help with your discomfort:  Take medicines only as told by your health care provider.  If you were prescribed an antibiotic medicine, take it as told by your health care provider. Do not stop taking the antibiotic even if you start to feel better.  Talk with your health care provider before you take a cough suppressant medicine.  Drink enough fluid to keep your urine clear or pale yellow.  If the air is dry, use a cold steam vaporizer or humidifier in your bedroom or your home to help loosen secretions.  Avoid anything that causes you to cough at work or at home.  If your cough is worse at night, try sleeping in a semi-upright position.  Avoid cigarette smoke. If you smoke, quit smoking. If you need help quitting, ask your health care provider.  Avoid caffeine.  Avoid alcohol.  Rest as needed. SEEK MEDICAL CARE IF:   You have new symptoms.  You cough up pus.  Your cough does not get better after 2-3 weeks, or your cough gets worse.  You cannot control your cough with suppressant medicines and you are losing sleep.  You  develop pain that is getting worse or pain that is not controlled with pain medicines.  You have a fever.  You have unexplained weight loss.  You have night sweats. SEEK IMMEDIATE MEDICAL CARE IF:  You cough up blood.  You have difficulty breathing.  Your heartbeat is very fast.   This information is not intended to replace advice given to you by your health care provider. Make sure you discuss any questions you have with your health care provider.   Document Released: 12/21/2010 Document Revised: 03/15/2015 Document Reviewed: 08/31/2014 Elsevier Interactive Patient Education 2016 Else[employer] Inc[employer]  Esophageal Spasm An esophageal spasm is a muscle spasm of the tube that connects the back of your throat to your stomach (esophagus). Your esophagus normally moves food and liquids down into your stomach with smooth, wavelike muscle contractions. If you have esophageal spasms, abnormal muscle contractions in your esophagus can be painful and cause you to have difficulty swallowing (dysphagia). There are two types of esophageal spasms. You may have one or both types:  Diffuse esophageal spasms. These are irregular, uncoordinated muscle movements of the esophagus. The diffuse type causes more dysphagia.  Nutcracker esophagus. This is a type of muscle contraction that is coordinated but too strong. The muscles move in a regular order, but the contraction is stronger than necessary. The nutcracker type of esophageal spasm is more painful. Severe cases of esophageal spasm can make it hard to eat well and do all your usual activities. Esophageal spasms often occur  along with severe heartburn (reflux esophagitis). Swallowed foods or liquids may also come back up into your throat (regurgitation).  CAUSES  The cause of esophageal spasm is not known. RISK FACTORS You may have a higher risk for esophageal spasm if you:  Are male.  Are an older person.  Eat very quickly.  Swallow foods or drinks  that are very hot or very cold.  Are depressed or anxious. SIGNS AND SYMPTOMS  Signs and symptoms can vary from day to day. They may be mild or severe. They may last for minutes or hours. Common signs and symptoms include:  Chest pain. This may feel like a heart attack.  Back pain.  Dysphagia.  Heartburn.  The feeling that something is stuck in your throat (globus).  Regurgitation of foods or liquids. DIAGNOSIS  Your health care provider may suspect esophageal spasm based on your symptoms and a physical exam. Tests may be done to help confirm the diagnosis. These may include:  Endoscopy. A flexible telescope is passed into your esophagus.  Barium swallow. An X-ray is done while you drink a substance (contrast material) that shows up on X-ray.  Esophageal manometry. Pressure measurements of the inside of your esophagus are taken while you swallow. TREATMENT  Mild cases of esophageal spasms may not need to be treated. You may be able to manage the spasms by avoiding the foods and eating habits that trigger them. Treatment for spasms that are more severe or frequent can include the following:  You may be given medicine to:  Relax the esophageal muscles.  Relieve muscle spasms (calcium channel blockers and nitrates).  Block nerve endings in the esophagus. This is done with an injection of a certain toxin (botulinum).  Relieve heartburn (proton pump inhibitors).  Antidepressant medicines are sometimes used to ease symptoms.  For severe cases, surgery is sometimes done to reduce esophageal muscle contractions (myotomy). HOME CARE INSTRUCTIONS   Take medicines only as directed by your health care provider.  Do not eat foods that trigger heartburn or esophageal spasms.  Eat your meals slowly.  Chew your food completely.  Avoid foods and drinks that are very hot or very cold.  Find ways to manage stress.  Ask for help if you struggle with depression or anxiety.  Keep  all follow-up visits as directed by your health care provider. This is important. SEEK MEDICAL CARE IF:   Your symptoms change or get worse.  You are losing weight because of dysphagia.  Your medicine is not helping your symptoms.  Your esophageal spasms are interfering with your quality of life. SEEK IMMEDIATE MEDICAL CARE IF:   You have very bad or unusual chest pain.  You have trouble breathing.  You choke. MAKE SURE YOU:  Understand these instructions.  Will watch your condition.  Will get help right away if you are not doing well or get worse.   This information is not intended to replace advice given to you by your health care provider. Make sure you discuss any questions you have with your health care provider.   Document Released: 09/14/2002 Document Revised: 07/15/2014 Document Reviewed: 09/17/2013 Elsevier Interactive Patient Education 2016 Elsevier Inc.  Stimulant Use Disorder-Amphetamines Amphetamines are one of a group of powerful drugs known as stimulants. Amphetamines have a number of medical uses, including the treatment of a daytime sleepiness disorder due to narcolepsy or sleep apnea, attention deficit hyperactivity disorder, and chronic fatigue syndrome. However, amphetamines also are often misused because of the effects they  produce. These effects include:  A feeling of extreme pleasure (euphoria).  Alertness.  Increased attention.  High energy.  Loss of appetite for weight loss. Common street names for these drugs include speed and crank. Amphetamines are taken by mouth, crushed and snorted, or dissolved in water and injected. Stimulants are addictive because they activate regions of the brain that are responsible for producing both the pleasurable sensation of "reward" and psychological dependence. Together, these actions account for loss of control and the rapid development of drug dependence. This means you will become ill without the drug  (withdrawal) and need to keep using it to function.  Stimulant use disorder is use of stimulants that disrupts your daily life. It disrupts relationships with family and friends and how you do your job. Amphetamines increase blood pressure and heart rate. Use can lead to heart attack or stroke. Use can also cause death from irregular heart rate, seizures, or dangerously high body temperature. SIGNS AND SYMPTOMS  Symptoms of stimulant use disorder with amphetamines include:  Use of amphetamines in larger amounts or over a longer period than intended.  Unsuccessful attempts to cut down or control amphetamine use.  A lot of time spent obtaining, using, or recovering from the effects of amphetamines.  A strong desire or urge to use amphetamines (craving).  Continued use of amphetamines in spite of major problems at work, school, or home because of use.  Continued use of amphetamines in spite of relationship problems because of use.  Giving up or cutting down on important life activities because of amphetamine use.  Use of amphetamines over and over in situations when it is physically hazardous, such as driving a car.  Continued use of amphetamines in spite of a physical problem that is likely related to amphetamine use. Physical problems can include:  Unintended weight loss.  High blood pressure.  Chest pain.  Infections such as human immunodeficiency virus and hepatitis (from injecting amphetamines).  Continued use of amphetamines in spite of mental problems that are likely related to use. Mental problems can include:  Anxiety.  Sleep problems.  Schizophrenia-like symptoms.  Depression.  Bipolar mood swings.  Violent behavior.  Need to use more and more amphetamines to get the same effect, or lessened effect over time with use of the same amount (tolerance).  Having withdrawal symptoms when amphetamine use is stopped, or using amphetamines to reduce or avoid withdrawal  symptoms. Withdrawal symptoms include:  Depressed mood.  Low energy or restlessness.  Bad dreams.  Too little or too much sleep.  Increased appetite. DIAGNOSIS  Stimulant use disorder is diagnosed by your health care provider. You may be asked questions about your amphetamine use and how it affects your life. A physical exam may be done. A drug screen may be ordered. You may be referred to a mental health professional. The diagnosis of stimulant use disorder requires two or more symptoms within 12 months. The type of stimulant use disorder you have depends on the number of signs and symptoms you have. The type may be:  Mild. Two or three signs and symptoms.  Moderate. Four or five signs and symptoms.  Severe. Six or more signs and symptoms. TREATMENT  The treatment for most problems related to stimulant use disorder with amphetamines may be divided into two types:  Short-term medical treatment. This helps to preserve life and prevent or minimize damage from physical or mental problems related to use.  Long-term substance abuse treatment. This focuses on recovery from use  disorder. It is provided by mental health professionals who have training in substance use disorders. It is usually a combination of counseling, support groups, and nonaddictive medicines that can reduce cravings or block the effects of amphetamines. HOME CARE INSTRUCTIONS   Take medicines only as directed by your health care provider.  Identify the people and activities that trigger your amphetamine use and avoid them.  Keep all follow-up visits as directed by your health care provider. SEEK MEDICAL CARE IF:  Your symptoms get worse or you relapse.  You are not able to take medicines as directed. SEEK IMMEDIATE MEDICAL CARE IF:   You have serious thoughts about hurting yourself or others.  You have a seizure, chest pain, sudden weakness, or loss of speech or vision. FOR MORE INFORMATION  National  Institute on Drug Abuse: http://www.price-smith.com/  Substance Abuse and Mental Health Services Administration: SkateOasis.com.pt   This information is not intended to replace advice given to you by your health care provider. Make sure you discuss any questions you have with your health care provider.   Document Released: 06/18/2001 Document Revised: 07/15/2014 Document Reviewed: 07/07/2013 Elsevier Interactive Patient Education [employer]

## 2015-06-25 NOTE — ED Notes (Signed)
Pt. Reported that he was able to take po fluid without difficulty at this time.

## 2015-06-28 ENCOUNTER — Ambulatory Visit (INDEPENDENT_AMBULATORY_CARE_PROVIDER_SITE_OTHER): Payer: Self-pay | Admitting: Internal Medicine

## 2015-06-28 ENCOUNTER — Encounter: Payer: Self-pay | Admitting: Internal Medicine

## 2015-06-28 VITALS — BP 137/95 | HR 101 | Temp 98.7°F | Wt 220.0 lb

## 2015-06-28 DIAGNOSIS — B20 Human immunodeficiency virus [HIV] disease: Secondary | ICD-10-CM

## 2015-06-28 DIAGNOSIS — F4323 Adjustment disorder with mixed anxiety and depressed mood: Secondary | ICD-10-CM

## 2015-06-28 DIAGNOSIS — F1510 Other stimulant abuse, uncomplicated: Secondary | ICD-10-CM

## 2015-06-28 DIAGNOSIS — J3089 Other allergic rhinitis: Secondary | ICD-10-CM

## 2015-06-28 MED ORDER — CETIRIZINE HCL 10 MG PO TABS: 10.0000 mg | ORAL_TABLET | Freq: Every day | ORAL | Status: DC

## 2015-06-28 NOTE — Progress Notes (Signed)
Patient ID: Jimmy Davis, male   DOB: 25-Dec-1980, 34 y.o.   MRN: [id number]       Patient ID: Jimmy Davis, male   DOB: 1980/07/11, 34 y.o.   MRN: [id number]  HPI CD 4 count of 850/VL<20 on stribild. He is also active meth user getting counseling with jodie. He has noticed worsening of mucous production which sometimes causes him to gag, and occasionally has cough. He went to ED < 5 days for evaluation.cxr normal at their visit. He has been using variety of over the counter meds, vick inhalers  Outpatient Encounter Prescriptions as of 06/28/2015  Medication Sig  . Arginine 500 MG CAPS Take 1 capsule by mouth daily as needed (for circulation).  . Aromatic Inhalants (VICKS VAPOR INHALER IN) Inhale 1 spray into the lungs daily.  Marland Kitchen dextromethorphan-guaiFENesin (MUCINEX DM) 30-600 MG 12hr tablet Take 1 tablet by mouth 2 (two) times daily as needed for cough.  . elvitegravir-cobicistat-emtricitabine-tenofovir (STRIBILD) 150-150-200-300 MG TABS tablet Take 1 tablet by mouth daily with breakfast.  . FLUoxetine (PROZAC) 20 MG capsule Take 3 capsules (60 mg total) by mouth daily.  . Multiple Vitamin (MULTIVITAMIN) capsule Take 1 capsule by mouth daily.  Marland Kitchen OVER THE COUNTER MEDICATION Take 1 tablet by mouth daily. Over the counter L-dopa supplement  . sodium chloride (OCEAN) 0.65 % SOLN nasal spray Place 1 spray into both nostrils as needed for congestion.  . Sodium Chloride-Xylitol (XLEAR SINUS CARE SPRAY NA) Place 1 spray into the nose daily.  . traMADol (ULTRAM) 50 MG tablet Take 1 tablet (50 mg total) by mouth every 8 (eight) hours as needed for severe pain.   No facility-administered encounter medications on file as of 06/28/2015.     Patient Active Problem List   Diagnosis Date Noted  . Cellulitis of left lower extremity 12/07/2014  . Sepsis (HCC) 12/07/2014  . Cellulitis 12/07/2014  . Substance abuse 10/03/2014  . Depression 10/03/2014  . Chronic diarrhea 07/27/2013  . Human  immunodeficiency virus (HIV) disease (HCC) 06/29/2013     Health Maintenance Due  Topic Date Due  . TETANUS/TDAP  07/02/1998     Review of Systems postive pertinents listed in hpi Physical Exam   BP 137/95 mmHg  Pulse 101  Temp(Src) 98.7 F (37.1 C) (Oral)  Wt 220 lb (99.791 kg) Physical Exam  Constitutional: He is oriented to person, place, and time. He appears well-developed and well-nourished. No distress.  HENT:  Mouth/Throat: Oropharynx is clear and moist. No oropharyngeal exudate.  Cardiovascular: Normal rate, regular rhythm and normal heart sounds. Exam reveals no gallop and no friction rub.  No murmur heard.  Pulmonary/Chest: Effort normal and breath sounds normal. No respiratory distress. He has no wheezes.  Abdominal: Soft. Bowel sounds are normal. He exhibits no distension. There is no tenderness.  Lymphadenopathy:  He has no cervical adenopathy.  Neurological: He is alert and oriented to person, place, and time.  Skin: Skin is warm and dry. No rash noted. No erythema.  Psychiatric: He has a normal mood and affect. His behavior is normal.    Lab Results  Component Value Date   CD4TCELL 26* 03/30/2015   Lab Results  Component Value Date   CD4TABS 850 03/30/2015   CD4TABS 750 12/27/2014   CD4TABS 460 06/16/2014   Lab Results  Component Value Date   HIV1RNAQUANT <20 03/30/2015   Lab Results  Component Value Date   HEPBSAB NEG 08/17/2013   No results found for: RPR  CBC Lab Results  Component Value Date   WBC 7.5 12/26/2014   RBC 4.48 12/26/2014   HGB 13.6 12/26/2014   HCT 39.6 12/26/2014   PLT 276 12/26/2014   MCV 88.4 12/26/2014   MCH 30.4 12/26/2014   MCHC 34.3 12/26/2014   RDW 13.7 12/26/2014   LYMPHSABS 3.2 12/26/2014   MONOABS 0.7 12/26/2014   EOSABS 0.2 12/26/2014   BASOSABS 0.0 12/26/2014   BMET Lab Results  Component Value Date   NA 136 12/26/2014   K 3.8 12/26/2014   CL 97 12/26/2014   CO2 24 12/26/2014   GLUCOSE 92  12/26/2014   BUN 14 12/26/2014   CREATININE 0.97 12/26/2014   CALCIUM 9.1 12/26/2014   GFRNONAA >89 12/26/2014   GFRAA >89 12/26/2014     Assessment and Plan  Allergic rhinitis = may have irritant from smoking meth and possible exacerbated with recent OTC concoctions. Recommend to go back to the basics. Use mucinex for mucolytic properties. Can do netti pot with saline. Will do a trial of zyrtec to see if that helps any of his symptoms.  hiv diseae = well controlled. Continue on stribild  Meth use = continue with counseling. Recommended to continue to try to stop use, may have resolution of #1  Anxiety = continue with prozac

## 2015-07-04 ENCOUNTER — Ambulatory Visit: Payer: Medicaid Other | Admitting: *Deleted

## 2015-07-06 ENCOUNTER — Ambulatory Visit: Payer: Medicaid Other | Admitting: *Deleted

## 2015-07-06 NOTE — BH Specialist Note (Signed)
Jimmy Davis was present for his scheduled appointment today with counselor. Client was oriented times four with good affect and dress. Client was alert and talkative.  Client said that he had not completely stopped using meth but had slowed down tremendously.  Client communicated that he made an appointment to have his substance abuse counselor at [facility] help him get into residential treatment. Counselor provided support and encouragement accordingly. Counselor educated client on how residential treatment would go and recommended that he go into treatment as soon as he could.  Client agreed and stated he had the appointment next Tuesday.  Client was asked to call counselor after appointment prior to going as his stay will be for 14 days and counselor would like to speak to him prior to going.  Jenel LucksJodi Almedia Cordell, MA [facility]

## 2015-07-14 ENCOUNTER — Telehealth: Payer: Self-pay | Admitting: *Deleted

## 2015-07-14 ENCOUNTER — Other Ambulatory Visit: Payer: Self-pay | Admitting: Internal Medicine

## 2015-07-14 DIAGNOSIS — B20 Human immunodeficiency virus [HIV] disease: Secondary | ICD-10-CM

## 2015-07-14 NOTE — Telephone Encounter (Signed)
Patient called back.  [facility] will need a letter stating that he is medically/physically stable for in patient treatment, should have a physician's signature.  The facility does not have a medical staff, as they do not detox. This should not be a problem per the patient. He states his detox will be "mental, not physical."  The letter will need to include recent vital signs from his visit.  He is scheduled for an intake assessment Tuesday 1/10 at 8am but this can be rescheduled if the letter is not ready.   [facility] (P: [phone]) [name], Briann Sarchet M, RN

## 2015-07-14 NOTE — Telephone Encounter (Signed)
Patient called stating he needs medical clearance to enter a rehab facility. Dr. Feliz BeamSnider's next available appt is in February, however he was just seen 06/28/15. He is going to call the facility and see if they have a form and what exactly needs to be completed. She may be able to fill it out based on his last visit. He will call us back. Wendall MolaJacqueline Cockerham

## 2015-07-15 NOTE — Telephone Encounter (Signed)
Lets get the letter for clearance so that he can do detox which is great news that he is ready for the next steps in rehab

## 2015-07-21 ENCOUNTER — Encounter: Payer: Self-pay | Admitting: Internal Medicine

## 2015-07-21 NOTE — Telephone Encounter (Signed)
Letter faxed to [phone], confirmation received.  Patient aware, will reschedule his intake.

## 2015-09-14 ENCOUNTER — Telehealth: Payer: Self-pay | Admitting: *Deleted

## 2015-09-14 NOTE — Telephone Encounter (Signed)
"  infected" left ring finger, cuticle area.  RN advised to pt to soak the finger in warm salt water as often as he can between today and Tues., March 14.  If finger is improved the pt will call and cancel the appt for 3:15 PM on 09/19/15 with Dr. Drue SecondSnider.

## 2015-09-15 ENCOUNTER — Emergency Department (HOSPITAL_COMMUNITY)
Admission: EM | Admit: 2015-09-15 | Discharge: 2015-09-15 | Disposition: A | Discharge status: Home / Self Care | Payer: Medicaid Other | Attending: Emergency Medicine | Admitting: Emergency Medicine

## 2015-09-15 ENCOUNTER — Encounter (HOSPITAL_COMMUNITY): Payer: Self-pay

## 2015-09-15 DIAGNOSIS — Z79899 Other long term (current) drug therapy: Secondary | ICD-10-CM | POA: Insufficient documentation

## 2015-09-15 DIAGNOSIS — F419 Anxiety disorder, unspecified: Secondary | ICD-10-CM | POA: Insufficient documentation

## 2015-09-15 DIAGNOSIS — L03012 Cellulitis of left finger: Secondary | ICD-10-CM | POA: Insufficient documentation

## 2015-09-15 DIAGNOSIS — IMO0001 Reserved for inherently not codable concepts without codable children: Secondary | ICD-10-CM

## 2015-09-15 DIAGNOSIS — Z21 Asymptomatic human immunodeficiency virus [HIV] infection status: Secondary | ICD-10-CM | POA: Insufficient documentation

## 2015-09-15 DIAGNOSIS — F429 Obsessive-compulsive disorder, unspecified: Secondary | ICD-10-CM | POA: Insufficient documentation

## 2015-09-15 MED ORDER — AMOXICILLIN-POT CLAVULANATE 875-125 MG PO TABS
1.0000 | ORAL_TABLET | Freq: Once | ORAL | Status: AC
  Administered 2015-09-15: 1 via ORAL
  Filled 2015-09-15: qty 1

## 2015-09-15 MED ORDER — AMOXICILLIN-POT CLAVULANATE 875-125 MG PO TABS: 1.0000 | ORAL_TABLET | Freq: Two times a day (BID) | ORAL | Status: DC

## 2015-09-15 NOTE — Discharge Instructions (Signed)
Please follow with your primary care doctor in the next 2 days for a check-up. They must obtain records for further management.   Do not hesitate to return to the Emergency Department for any new, worsening or concerning symptoms.    Paronychia Paronychia is an infection of the skin that surrounds a nail. It usually affects the skin around a fingernail, but it may also occur near a toenail. It often causes pain and swelling around the nail. This condition may come on suddenly or develop over a longer period. In some cases, a collection of pus (abscess) can form near or under the nail. Usually, paronychia is not serious and it clears up with treatment. CAUSES This condition may be caused by bacteria or fungi. It is commonly caused by either Streptococcus or Staphylococcus bacteria. The bacteria or fungi often cause the infection by getting into the affected area through an opening in the skin, such as a cut or a hangnail. RISK FACTORS This condition is more likely to develop in:  People who get their hands wet often, such as those who work as Fish farm managerdishwashers, bartenders, or nurses.  People who bite their fingernails or suck their thumbs.  People who trim their nails too short.  People who have hangnails or injured fingertips.  People who get manicures.  People who have diabetes. SYMPTOMS Symptoms of this condition include:  Redness and swelling of the skin near the nail.  Tenderness around the nail when you touch the area.  Pus-filled bumps under the cuticle. The cuticle is the skin at the base or sides of the nail.  Fluid or pus under the nail.  Throbbing pain in the area. DIAGNOSIS This condition is usually diagnosed with a physical exam. In some cases, a sample of pus may be taken from an abscess to be tested in a lab. This can help to determine what type of bacteria or fungi is causing the condition. TREATMENT Treatment for this condition depends on the cause and severity of the  condition. If the condition is mild, it may clear up on its own in a few days. Your health care provider may recommend soaking the affected area in warm water a few times a day. When treatment is needed, the options may include:  Antibiotic medicine, if the condition is caused by a bacterial infection.  Antifungal medicine, if the condition is caused by a fungal infection.  Incision and drainage, if an abscess is present. In this procedure, the health care provider will cut open the abscess so the pus can drain out. HOME CARE INSTRUCTIONS  Soak the affected area in warm water if directed to do so by your health care provider. You may be told to do this for 20 minutes, 2-3 times a day. Keep the area dry in between soakings.  Take medicines only as directed by your health care provider.  If you were prescribed an antibiotic medicine, finish all of it even if you start to feel better.  Keep the affected area clean.  Do not try to drain a fluid-filled bump yourself.  If you will be washing dishes or performing other tasks that require your hands to get wet, wear rubber gloves. You should also wear gloves if your hands might come in contact with irritating substances, such as cleaners or chemicals.  Follow your health care provider's instructions about:  Wound care.  Bandage (dressing) changes and removal. SEEK MEDICAL CARE IF:  Your symptoms get worse or do not improve with treatment.  You have a fever or chills.  You have redness spreading from the affected area.  You have continued or increased fluid, blood, or pus coming from the affected area.  Your finger or knuckle becomes swollen or is difficult to move.   This information is not intended to replace advice given to you by your health care provider. Make sure you discuss any questions you have with your health care provider.   Document Released: 12/18/2000 Document Revised: 11/08/2014 Document Reviewed: 06/01/2014 Elsevier  Interactive Patient Education 2016 [employer]. [employer] Guide Outpatient Counseling/Substance Abuse Adult The United Ways 211 is a great source of information about community services available.  Access by dialing 2-1-1 from anywhere in [place], or by website -  PooledIncome.pl.   Other Local Resources (Updated 07/2015)  Crisis Hotlines   Services     Area Served  [employer]  Crisis Hotline, available 24 hours a day, 7 days a week: [phone] [facility], [place]   [facility]  Crisis Hotline, available 24 hours a day, 7 days a week: 505-413-7838 [facility], [place]  [facility]  Suicide Prevention Hotline, available 24 hours a day, 7 days a week: (346)350-6600 [facility], [place]  [employer], available 24 hours a day, 7 days a week: 254-622-0182 [facility], [place]   [facility] Access to [employer], available 24 hours a day, 7 days a week: 604-643-1897 All   [facility]  Crisis Hotline, available 24 hours a day, 7 days a week: 431-001-5153 All   Other Local Resources (Updated 07/2015)  Outpatient Counseling/ Substance Abuse Programs  Services     Address and Phone Number  [facility] ([facility])   Options include Individual counseling, group counseling, intensive outpatient program (several hours a day, several days a week)  Offers depression assessments  Provides methadone maintenance program [phone] [address] [place], [place] [address]   [facility]   Offers partial hospitalization/day treatment and DUI/DWI programs  [employer], private insurance [phone] [address] [place], [place] [address]  [facility]    Services include intensive outpatient program (several hours a day, several days a week), outpatient treatment, DUI/DWI services, family education  Also has some services  specifically for [employer] transitional housing  [phone] [address] [place], [place] [address]     [facility]  [employer], private pay, and private insurance [phone] [address] [place], [place] [address]  [employer] [facility]  Services include individual counseling, substance abuse intensive outpatient program (several hours a day, several days a week), day treatment  Delene Loll, Medicaid, private insurance [phone] [address] [place], [place] [address]  [facility]   Offers substance abuse intensive outpatient program (several hours a day, several days a week), partial hospitalization program [phone] [address] [place], [place] [address]  [phone] [address] [place], [place] [address]  [phone] [address] [place], [place] [address]  [phone], [address] [place], [place] [address]  [facility]  Individual counseling only  Accepts private insurance only [phone] [address] [place], [place] [address]  [facility]  Methadone maintenance program [phone] [address] [place], [place] [address]  [facility]  Walk-In Clinic providing substance abuse and mental health counseling  Accepts Medicaid, Medicare, private insurance  Offers sliding  scale for uninsured [phone] [address], [place]   [facility] [place], [employer].  Offers individual counseling, and intensive in-home services [phone] [address] [place], [place] [address]  [facility] individual counseling, family counseling, group therapy, domestic violence counseling, consumer credit counseling  Accepts Medicare, Medicaid, private insurance  Offers sliding scale for uninsured [phone] [address] [place], [place] [address]  [phone] [facility], [address] [place], [place] [address]  [facility]  Offers individual, family and group counseling  3 locations - [place], [place], and [place]  [phone]  [address] [place], [place] [address]  [address] [place], [place] [address]  [address] [place], [place] [address]  [facility]    Offers psychiatric assessment, 8-week Intensive Outpatient Program (several hours a day, several times a week, daytime or evenings), early recovery group, family Program, medication management  Private pay or private insurance only [phone], or  [phone] [address] [place], [place] [address]  [facility] [employer] individual, couples and family counseling  Accepts Medicaid, private insurance, and sliding scale for uninsured [phone] [address] [place], [place] [address]  Len Blalock, MD  Individual counseling  Private insurance [phone] [address] [place], [place] [address]  [facility]   Offers assessment, substance abuse treatment, and behavioral health treatment [phone] [address] [place], [place] [address]  [facility]  Individual counseling  Accepts private insurance [phone] [address] [place], [place] [address]  [facility]  Individual counseling  Delene Loll, private insurance [phone] [address] [place], [place] [address]  [facility]    Offers intensive outpatient program (several hours a day, several times a week)  Private pay, private insurance [phone] [facility] [place], [place]  [facility]  Individual counseling  Medicare, private insurance [phone] [address] [place], [place] [address]  [facility]    Offers intensive outpatient program  (several hours a day, several times a week) and partial hospitalization program [phone] [address] [place], [place] [address]  Emerson Monte, MD  Individual counseling [phone] [address] [place], [place] [address]  [facility]  Offers Christian counseling to individuals, couples, and families  Accepts Medicare and private insurance; offers sliding scale for uninsured [phone] [address] [place], [place] [address]  [facility]  [place] counseling [phone] [address] [place], [place] [address]  [facility] crisis counseling, individual counseling, group therapy, in-home therapy, domestic violence services, day treatment, DWI services, Administrator, arts (CST), Assertive Community Treatment Team (ACTT), substance abuse Intensive Outpatient Program (several hours a day, several times a week)  2 locations - [place] and [place] [phone] [address] [place], [place] [address]  [phone] [address] [place], [place] [address]  [facility]     Individual counseling and group therapy  Accepts private insurance, [place], [place] [phone] [address] [place], [place]  [facility]  Offers individual and family counseling  Offers LGBTQ services  Accepts private insurance and private pay [phone] [address] [place], [place] [address]  [facility]    Offers individual counseling, group therapy, and outpatient detox  Accepts private insurance [phone] [address] [place], [place]  [facility]  Individual counseling  Accepts Medicare, private insurance  [phone] [address] [place], [place] [address]  [employer]  Individual counseling  Accepts Medicare, private insurance [phone] [address] [place], [place] [address]  [facility]   Offers substance abuse Intensive Outpatient Program (several hours a day, several times a week) [phone], or [phone] [place], [place]

## 2015-09-15 NOTE — ED Provider Notes (Signed)
CSN: [id number]     Arrival date & time 09/15/15  1035 History  By signing my name below, I, Jimmy Davis, attest that this documentation has been prepared under the direction and in the presence of [employer], PA-C. Electronically Signed: Angelene Davis, ED Scribe. 09/15/2015. 12:39 PM.    Chief Complaint  Patient presents with  . finger redness     left 4th finger   The history is provided by the patient. No language interpreter was used.   HPI Comments: Jimmy Davis is a 35 y.o. male with a hx of HIV (Chart review showed that his last CD4 count was over 800 and his viral load is undetectable) who presents to the Emergency Department complaining of gradually worsening moderate pain, swelling, and redness to his left 4th finger below the nailbed onset yesterday. He states that he normally bites the skin around the nail. He denies any recent injuries or trauma to the hand. 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 20 times a day. He also reports a hx of OCD and has been engaging in compulsive hand touching and is wondering if his symptoms are due to his compulsive habits. No alleviating symptoms noted. Pt has not tried any medication or procedures PTA. He reports that he has had a previous similar symptom but did not seek professional medical care at that time. No fever, chills, drainage, or n/v.    Past Medical History  Diagnosis Date  . Anxiety   . Depression   . Substance abuse   . Human immunodeficiency virus (HIV) disease (HCC)    Past Surgical History  Procedure Laterality Date  . Wisdom tooth extraction     Family History  Problem Relation Age of Onset  . Heart disease Father   . Mental illness Maternal Grandmother    Social History  Substance Use Topics  . Smoking status: Never Smoker   . Smokeless tobacco: Never Used  . Alcohol Use: No    Review of Systems  A complete 10 system  review of systems was obtained and all systems are negative except as noted in the HPI and PMH.    Allergies  Review of patient's allergies indicates no known allergies.  Home Medications   Prior to Admission medications   Medication Sig Start Date End Date Taking? Authorizing Provider  Arginine 500 MG CAPS Take 1 capsule by mouth daily as needed (for circulation).    Historical Provider, MD  Aromatic Inhalants (VICKS VAPOR INHALER IN) Inhale 1 spray into the lungs daily.    Historical Provider, MD  cetirizine (ZYRTEC) 10 MG tablet Take 1 tablet (10 mg total) by mouth daily. 06/28/15   Judyann Munson, MD  dextromethorphan-guaiFENesin [facility] DM) 30-600 MG 12hr tablet Take 1 tablet by mouth 2 (two) times daily as needed for cough.    Historical Provider, MD  FLUoxetine (PROZAC) 20 MG capsule Take 3 capsules (60 mg total) by mouth daily. 03/30/15   Judyann Munson, MD  Multiple Vitamin (MULTIVITAMIN) capsule Take 1 capsule by mouth daily.    Historical Provider, MD  OVER THE COUNTER MEDICATION Take 1 tablet by mouth daily. Over the counter L-dopa supplement    Historical Provider, MD  sodium chloride (OCEAN) 0.65 % SOLN nasal spray Place 1 spray into both nostrils as needed for congestion.    Historical Provider, MD  Sodium Chloride-Xylitol (XLEAR SINUS CARE SPRAY NA) Place 1 spray into the  nose daily.    Historical Provider, MD  STRIBILD 150-150-200-300 MG TABS tablet TAKE 1 TABLET BY MOUTH EVERY DAY WITH BREAKFAST 07/14/15   Judyann Munsonynthia Snider, MD  traMADol (ULTRAM) 50 MG tablet Take 1 tablet (50 mg total) by mouth every 8 (eight) hours as needed for severe pain. 12/09/14   Ripudeep K Rai, MD   BP 142/94 mmHg  Pulse 114  Temp(Src) 98.1 F (36.7 C) (Oral)  Resp 16  Ht 5\' 10"  (1.778 m)  Wt 220 lb (99.791 kg)  BMI 31.57 kg/m2  SpO2 99% Physical Exam  Constitutional: He is oriented to person, place, and time. He appears well-developed and well-nourished.  HENT:  Head: Normocephalic and  atraumatic.  Cardiovascular: Normal rate.   Pulmonary/Chest: Effort normal.  Abdominal: He exhibits no distension.  Musculoskeletal:  4th left finger on the ulnar aspect a moderate perionychia with no active drainage, no associated felon.  Neurological: He is alert and oriented to person, place, and time.  Skin: Skin is warm and dry.  Psychiatric: He has a normal mood and affect. He expresses no homicidal and no suicidal ideation.  Nervous affect, no suicidal ideation, homicidal ideation, auditory or visual hallucinations.  Nursing note and vitals reviewed.   ED Course  .Marland Kitchen.Incision and Drainage Date/Time: 09/15/2015 1:18 PM Performed by: Wynetta EmeryPISCIOTTA, Rashied Corallo Authorized by: Wynetta EmeryPISCIOTTA, Taiwo Fish Consent: Verbal consent obtained. Consent given by: patient Required items: required blood products, implants, devices, and special equipment available Patient identity confirmed: verbally with patient Type: abscess Body area: upper extremity Location details: left ring finger Anesthesia method: tourniquet 5 minutes  Patient sedated: no Scalpel size: 11 Incision type: single straight Incision depth: dermal Drainage: purulent Drainage amount: moderate Wound treatment: wound left open Packing material: none Patient tolerance: Patient tolerated the procedure well with no immediate complications   (including critical care time) DIAGNOSTIC STUDIES: Oxygen Saturation is 99% on RA, normal by my interpretation.    COORDINATION OF CARE: 12:29 PM- Pt advised of plan for treatment and pt agrees. Pt will receive an I&D.     MDM   Final diagnoses:  None    Filed Vitals:   09/15/15 1100 09/15/15 1122 09/15/15 1300  BP: 139/101 142/94   Pulse: 114  87  Temp: 98.1 F (36.7 C)    TempSrc: Oral    Resp: 16    Height:  5\' 10"  (1.778 m)   Weight:  99.791 kg   SpO2: 99%  97%    Medications  amoxicillin-clavulanate (AUGMENTIN) 875-125 MG per tablet 1 tablet (1 tablet Oral Given 09/15/15 1259)     Jimmy Davis is 35 y.o. male presenting with paronychia to left fourth digit. No signs of systemic infection. He has HIV with undetectable viral load and good CD4 count. I and D performed and patient will be started on Augmentin. Patient has disclosed that he is washing his hands frequently and feels that his OCD is flaring, states that he does not think that these are helpful for him. He doesn't have any psychiatric care currently. Patient is given a Facilities managerresource guide and advised to discuss with his primary care physician. No indication for emergent psychiatric intervention at this time.  Evaluation does not show pathology that would require ongoing emergent intervention or inpatient treatment. Pt is hemodynamically stable and mentating appropriately. Discussed findings and plan with patient/guardian, who agrees with care plan. All questions answered. Return precautions discussed and outpatient follow up given.   Discharge Medication List as of 09/15/2015 12:54 PM  START taking these medications   Details  amoxicillin-clavulanate (AUGMENTIN) 875-125 MG tablet Take 1 tablet by mouth every 12 (twelve) hours., Starting 09/15/2015, Until Discontinued, Print         I personally performed the services described in this documentation, which was scribed in my presence. The recorded information has been reviewed and is accurate.   Wynetta Emery, PA-C 09/15/15 1320  Marily Memos, MD 09/15/15 [phone]

## 2015-09-15 NOTE — ED Notes (Signed)
Patient c/o left 4th finger redness, swelling and pain below the nailbed area since yesterday. Patient denies any injury.

## 2015-09-15 NOTE — Telephone Encounter (Signed)
Patient called back stating his finger has worsened and the swelling has increased. Advised him to go to Urgent Care to be evaluated. He has not had a fever. He will keep the appointment on 09/19/15 to follow up or  call in advance to cancel.

## 2015-09-19 ENCOUNTER — Ambulatory Visit (INDEPENDENT_AMBULATORY_CARE_PROVIDER_SITE_OTHER): Payer: Self-pay | Admitting: Internal Medicine

## 2015-09-19 ENCOUNTER — Ambulatory Visit: Payer: Self-pay | Admitting: *Deleted

## 2015-09-19 ENCOUNTER — Encounter: Payer: Self-pay | Admitting: Internal Medicine

## 2015-09-19 VITALS — BP 125/83 | HR 106 | Temp 97.8°F | Wt 228.0 lb

## 2015-09-19 DIAGNOSIS — B20 Human immunodeficiency virus [HIV] disease: Secondary | ICD-10-CM

## 2015-09-19 DIAGNOSIS — L03012 Cellulitis of left finger: Secondary | ICD-10-CM

## 2015-09-19 DIAGNOSIS — F1510 Other stimulant abuse, uncomplicated: Secondary | ICD-10-CM

## 2015-09-19 DIAGNOSIS — Z79899 Other long term (current) drug therapy: Secondary | ICD-10-CM

## 2015-09-19 DIAGNOSIS — F429 Obsessive-compulsive disorder, unspecified: Secondary | ICD-10-CM

## 2015-09-19 DIAGNOSIS — Z113 Encounter for screening for infections with a predominantly sexual mode of transmission: Secondary | ICD-10-CM

## 2015-09-19 LAB — CBC WITH DIFFERENTIAL/PLATELET
BASOS ABS: 0 10*3/uL (ref 0.0–0.1)
Basophils Relative: 0 % (ref 0–1)
EOS ABS: 0.1 10*3/uL (ref 0.0–0.7)
EOS PCT: 1 % (ref 0–5)
HEMATOCRIT: 42.8 % (ref 39.0–52.0)
Hemoglobin: 14.9 g/dL (ref 13.0–17.0)
LYMPHS PCT: 36 % (ref 12–46)
Lymphs Abs: 3.1 10*3/uL (ref 0.7–4.0)
MCH: 30.8 pg (ref 26.0–34.0)
MCHC: 34.8 g/dL (ref 30.0–36.0)
MCV: 88.6 fL (ref 78.0–100.0)
MPV: 8.9 fL (ref 8.6–12.4)
Monocytes Absolute: 0.6 10*3/uL (ref 0.1–1.0)
Monocytes Relative: 7 % (ref 3–12)
Neutro Abs: 4.8 10*3/uL (ref 1.7–7.7)
Neutrophils Relative %: 56 % (ref 43–77)
PLATELETS: 256 10*3/uL (ref 150–400)
RBC: 4.83 MIL/uL (ref 4.22–5.81)
RDW: 13.8 % (ref 11.5–15.5)
WBC: 8.5 10*3/uL (ref 4.0–10.5)

## 2015-09-19 LAB — LIPID PANEL
Cholesterol: 166 mg/dL (ref 125–200)
HDL: 36 mg/dL — ABNORMAL LOW (ref >=40)
LDL Cholesterol: 107 mg/dL (ref <130)
Total CHOL/HDL Ratio: 4.6 Ratio (ref <=5.0)
Triglycerides: 117 mg/dL (ref <150)
VLDL: 23 mg/dL (ref <30)

## 2015-09-19 LAB — COMPLETE METABOLIC PANEL WITH GFR
ALT: 38 U/L (ref 9–46)
AST: 27 U/L (ref 10–40)
Albumin: 4.8 g/dL (ref 3.6–5.1)
Alkaline Phosphatase: 115 U/L (ref 40–115)
BUN: 11 mg/dL (ref 7–25)
CHLORIDE: 98 mmol/L (ref 98–110)
CO2: 26 mmol/L (ref 20–31)
CREATININE: 0.96 mg/dL (ref 0.60–1.35)
Calcium: 9.6 mg/dL (ref 8.6–10.3)
GFR, Est African American: >89 mL/min (ref >=60)
GFR, Est Non African American: >89 mL/min (ref >=60)
GLUCOSE: 94 mg/dL (ref 65–99)
Potassium: 4 mmol/L (ref 3.5–5.3)
Sodium: 137 mmol/L (ref 135–146)
Total Bilirubin: 0.6 mg/dL (ref 0.2–1.2)
Total Protein: 8.1 g/dL (ref 6.1–8.1)

## 2015-09-19 MED ORDER — SULFAMETHOXAZOLE-TRIMETHOPRIM 800-160 MG PO TABS: 1.0000 | ORAL_TABLET | Freq: Two times a day (BID) | ORAL | Status: DC

## 2015-09-19 MED ORDER — FLUOXETINE HCL 40 MG PO CAPS: 80.0000 mg | ORAL_CAPSULE | Freq: Every day | ORAL | Status: DC

## 2015-09-19 MED ORDER — ELVITEG-COBIC-EMTRICIT-TENOFAF 150-150-200-10 MG PO TABS: 1.0000 | ORAL_TABLET | Freq: Every day | ORAL | Status: DC

## 2015-09-19 NOTE — BH Specialist Note (Signed)
Counselor met with Jimmy Davis in the exam room as patient was seeking substance abuse treatment at [facility].  Counselor shared with patient that [facility] could not provide substance abuse treatment that they would simply refer him to [facility] of which he was already referred to by counselor and patient refused to adhere to the recommendation of going into detox.  Counselor provided support and encouragement as patient teared up a lot during meeting. Counselor recommended that patient seek detox and then residential treatment as his symptoms have not gotten any better.  Patient clearly indicated that he just wasn't ready. Counselor shared with patient that he was fully supported despite not agreeing.   Rolena Infante, MA [facility]

## 2015-09-19 NOTE — Progress Notes (Signed)
Patient ID: Jimmy Davis, male   DOB: 1980/10/31, 35 y.o.   MRN: [id number]       Patient ID: Jimmy Davis, male   DOB: [birth year], 35 y.o.   MRN: [id number]  HPI 35yo M with HIV disease, and hx of meth use. CD 4 coutn of 850/VL<20 on stribild. Also has histoyr of meth use. He is trying to cut back and get involved in treamtent program. He recently found to have abrupt swelling of fingernail. Went to UC for I X D, now palced on amox/clav. He has hx of OCD but lately obsessed iwht washing hands. He asked" how much is too much washing hands"  Outpatient Encounter Prescriptions as of 09/19/2015  Medication Sig  . amoxicillin-clavulanate (AUGMENTIN) 875-125 MG tablet Take 1 tablet by mouth every 12 (twelve) hours.  . Arginine 500 MG CAPS Take 1 capsule by mouth daily as needed (for circulation).  . Aromatic Inhalants (VICKS VAPOR INHALER IN) Inhale 1 spray into the lungs daily.  . cetirizine (ZYRTEC) 10 MG tablet Take 1 tablet (10 mg total) by mouth daily.  Marland Kitchen dextromethorphan-guaiFENesin (MUCINEX DM) 30-600 MG 12hr tablet Take 1 tablet by mouth 2 (two) times daily as needed for cough.  Marland Kitchen FLUoxetine (PROZAC) 20 MG capsule Take 3 capsules (60 mg total) by mouth daily.  . Multiple Vitamin (MULTIVITAMIN) capsule Take 1 capsule by mouth daily.  Marland Kitchen OVER THE COUNTER MEDICATION Take 1 tablet by mouth daily. Over the counter L-dopa supplement  . sodium chloride (OCEAN) 0.65 % SOLN nasal spray Place 1 spray into both nostrils as needed for congestion.  . Sodium Chloride-Xylitol (XLEAR SINUS CARE SPRAY NA) Place 1 spray into the nose daily.  . STRIBILD 150-150-200-300 MG TABS tablet TAKE 1 TABLET BY MOUTH EVERY DAY WITH BREAKFAST  . [DISCONTINUED] traMADol (ULTRAM) 50 MG tablet Take 1 tablet (50 mg total) by mouth every 8 (eight) hours as needed for severe pain.   No facility-administered encounter medications on file as of 09/19/2015.     Patient Active Problem List   Diagnosis Date Noted  .  Cellulitis of left lower extremity 12/07/2014  . Sepsis (HCC) 12/07/2014  . Cellulitis 12/07/2014  . Substance abuse 10/03/2014  . Depression 10/03/2014  . Chronic diarrhea 07/27/2013  . Human immunodeficiency virus (HIV) disease (HCC) 06/29/2013     Health Maintenance Due  Topic Date Due  . TETANUS/TDAP  07/02/1998     Review of Systems Review of Systems  Constitutional: Negative for fever, chills, diaphoresis, activity change, appetite change, fatigue and unexpected weight change.  HENT: Negative for congestion, sore throat, rhinorrhea, sneezing, trouble swallowing and sinus pressure.  Eyes: Negative for photophobia and visual disturbance.  Respiratory: Negative for cough, chest tightness, shortness of breath, wheezing and stridor.  Cardiovascular: Negative for chest pain, palpitations and leg swelling.  Gastrointestinal: Negative for nausea, vomiting, abdominal pain, diarrhea, constipation, blood in stool, abdominal distention and anal bleeding.  Genitourinary: Negative for dysuria, hematuria, flank pain and difficulty urinating.  Musculoskeletal: Negative for myalgias, back pain, joint swelling, arthralgias and gait problem.  Skin: + redness at fingernail Neurological: Negative for dizziness, tremors, weakness and light-headedness.  Hematological: Negative for adenopathy. Does not bruise/bleed easily.  Psychiatric/Behavioral: Negative for behavioral problems, confusion, sleep disturbance, dysphoric mood, decreased concentration and agitation.    Physical Exam   BP 125/83 mmHg  Pulse 106  Temp(Src) 97.8 F (36.6 C) (Oral)  Wt 228 lb (103.42 kg) Physical Exam  Constitutional: He is oriented to person, place, and time.  He appears well-developed and well-nourished. No distress.  HENT:  Mouth/Throat: Oropharynx is clear and moist. No oropharyngeal exudate.  Cardiovascular: Normal rate, regular rhythm and normal heart sounds. Exam reveals no gallop and no friction rub.  No  murmur heard.  Pulmonary/Chest: Effort normal and breath sounds normal. No respiratory distress. He has no wheezes.  Abdominal: Soft. Bowel sounds are normal. He exhibits no distension. There is no tenderness.  Lymphadenopathy:  He has no cervical adenopathy.  Neurological: He is alert and oriented to person, place, and time.  Skin: Skin is warm and dry. No rash noted. No erythema.  Psychiatric: He has a normal mood and affect. His behavior is normal.    Lab Results  Component Value Date   CD4TCELL 26* 03/30/2015   Lab Results  Component Value Date   CD4TABS 850 03/30/2015   CD4TABS 750 12/27/2014   CD4TABS 460 06/16/2014   Lab Results  Component Value Date   HIV1RNAQUANT <20 03/30/2015   Lab Results  Component Value Date   HEPBSAB NEG 08/17/2013   No results found for: RPR  CBC Lab Results  Component Value Date   WBC 7.5 12/26/2014   RBC 4.48 12/26/2014   HGB 13.6 12/26/2014   HCT 39.6 12/26/2014   PLT 276 12/26/2014   MCV 88.4 12/26/2014   MCH 30.4 12/26/2014   MCHC 34.3 12/26/2014   RDW 13.7 12/26/2014   LYMPHSABS 3.2 12/26/2014   MONOABS 0.7 12/26/2014   EOSABS 0.2 12/26/2014   BASOSABS 0.0 12/26/2014   BMET Lab Results  Component Value Date   NA 136 12/26/2014   K 3.8 12/26/2014   CL 97 12/26/2014   CO2 24 12/26/2014   GLUCOSE 92 12/26/2014   BUN 14 12/26/2014   CREATININE 0.97 12/26/2014   CALCIUM 9.1 12/26/2014   GFRNONAA >89 12/26/2014   GFRAA >89 12/26/2014     Assessment and Plan   hiv disease = will switch to genvoya and check labs today  parynochia = looks like it is healing continue with augmentin. Will give rx for bactrim to cover for mrsa  ocd = will increase to 80mg  prozac nad refer to family services  Meth use = taking to [facility] for counseling  rtc in 4 wk

## 2015-09-20 LAB — RPR

## 2015-09-21 LAB — T-HELPER CELL (CD4) - (RCID CLINIC ONLY)
CD4 T CELL ABS: 730 /uL (ref 400–2700)
CD4 T CELL HELPER: 25 % — AB (ref 33–55)

## 2015-09-21 LAB — HIV-1 RNA QUANT-NO REFLEX-BLD
HIV 1 RNA Quant: <20 copies/mL (ref <20)
HIV-1 RNA Quant, Log: <1.3 Log copies/mL (ref <1.30)

## 2015-10-13 ENCOUNTER — Ambulatory Visit: Payer: Medicaid Other | Admitting: *Deleted

## 2015-10-16 ENCOUNTER — Ambulatory Visit: Payer: Self-pay | Admitting: *Deleted

## 2015-10-16 DIAGNOSIS — F1510 Other stimulant abuse, uncomplicated: Secondary | ICD-10-CM

## 2015-10-16 NOTE — BH Specialist Note (Signed)
Jimmy Davis was present for his scheduled appointment today with counselor.  Patient was oriented times four and calmer than usual. Patient was alert and talkative.  Patient responded well to questions but was emotional and tearful throughout session.  Patient shared that he would like counselor help him go to detox and then outpatient substance abuse treatment.  Counselor complimented patient for acknowledging that he needs help and has a substance abuse problem. Counselor communicated to patient that this was a wise decision.  Counselor shared with patient the referral for detox with [facility] and provided information for outpatient treatments once detox was completed.  Patient agreed to prepare for the treatment and hospitalization but said that he did not want to do it until May 1st.  Counselor encouraged patient to not delay the inevitable.  Counselor provided support and encouragement accordingly.   Jimmy LucksJodi Mahad Newstrom, MA [facility]

## 2015-10-31 ENCOUNTER — Ambulatory Visit: Payer: Medicaid Other | Admitting: *Deleted

## 2015-10-31 ENCOUNTER — Ambulatory Visit (INDEPENDENT_AMBULATORY_CARE_PROVIDER_SITE_OTHER): Payer: Self-pay | Admitting: Internal Medicine

## 2015-10-31 ENCOUNTER — Encounter: Payer: Self-pay | Admitting: Internal Medicine

## 2015-10-31 VITALS — BP 129/81 | HR 90 | Temp 97.9°F | Ht 70.0 in | Wt 223.8 lb

## 2015-10-31 DIAGNOSIS — F1590 Other stimulant use, unspecified, uncomplicated: Secondary | ICD-10-CM

## 2015-10-31 DIAGNOSIS — F1510 Other stimulant abuse, uncomplicated: Secondary | ICD-10-CM

## 2015-10-31 DIAGNOSIS — B20 Human immunodeficiency virus [HIV] disease: Secondary | ICD-10-CM

## 2015-10-31 DIAGNOSIS — M722 Plantar fascial fibromatosis: Secondary | ICD-10-CM

## 2015-10-31 NOTE — Progress Notes (Signed)
Patient ID: Jimmy Davis, male   DOB: 08-Aug-1980, 35 y.o.   MRN: [id number]       Patient ID: Jimmy Davis, male   DOB: 1981/06/26, 35 y.o.   MRN: [id number]  HPI 730/VL 25 in march 2017 currently on genvoya. Which we switched at last appt. He is tolerating it without difficulty. He has been meeting with jodie, substance abuse counselor on a regular basis, and planning to do detox program at [facility] on May 1st then go into a residential treatment program thereafter for a few weeks.  Noticing pain to bottoms of feet bilaterally  Outpatient Encounter Prescriptions as of 10/31/2015  Medication Sig  . Arginine 500 MG CAPS Take 1 capsule by mouth daily as needed (for circulation).  . Aromatic Inhalants (VICKS VAPOR INHALER IN) Inhale 1 spray into the lungs daily.  . cetirizine (ZYRTEC) 10 MG tablet Take 1 tablet (10 mg total) by mouth daily.  Marland Kitchen dextromethorphan-guaiFENesin (MUCINEX DM) 30-600 MG 12hr tablet Take 1 tablet by mouth 2 (two) times daily as needed for cough.  . elvitegravir-cobicistat-emtricitabine-tenofovir (GENVOYA) 150-150-200-10 MG TABS tablet Take 1 tablet by mouth daily with breakfast.  . FLUoxetine (PROZAC) 40 MG capsule Take 2 capsules (80 mg total) by mouth daily.  . Multiple Vitamin (MULTIVITAMIN) capsule Take 1 capsule by mouth daily.  . sodium chloride (OCEAN) 0.65 % SOLN nasal spray Place 1 spray into both nostrils as needed for congestion.  . Sodium Chloride-Xylitol (XLEAR SINUS CARE SPRAY NA) Place 1 spray into the nose daily. Reported on 10/31/2015  . [DISCONTINUED] amoxicillin-clavulanate (AUGMENTIN) 875-125 MG tablet Take 1 tablet by mouth every 12 (twelve) hours.  . [DISCONTINUED] OVER THE COUNTER MEDICATION Take 1 tablet by mouth daily. Over the counter L-dopa supplement  . [DISCONTINUED] sulfamethoxazole-trimethoprim (BACTRIM DS,SEPTRA DS) 800-160 MG tablet Take 1 tablet by mouth 2 (two) times daily.   No facility-administered encounter medications on file as of  10/31/2015.     Patient Active Problem List   Diagnosis Date Noted  . Cellulitis of left lower extremity 12/07/2014  . Sepsis (HCC) 12/07/2014  . Cellulitis 12/07/2014  . Substance abuse 10/03/2014  . Depression 10/03/2014  . Chronic diarrhea 07/27/2013  . Human immunodeficiency virus (HIV) disease (HCC) 06/29/2013     Health Maintenance Due  Topic Date Due  . TETANUS/TDAP  07/02/1998     Review of Systems Per hpi, otherwise 10 point ros is negative Physical Exam   BP 129/81 mmHg  Pulse 90  Temp(Src) 97.9 F (36.6 C) (Oral)  Ht  (1.778 m)  Wt 223 lb 12 oz (101.492 kg)  BMI 32.10 kg/m2 Physical Exam  Constitutional: He is oriented to person, place, and time. He appears well-developed and well-nourished. No distress.  HENT:  Mouth/Throat: Oropharynx is clear and moist. No oropharyngeal exudate.  Cardiovascular: Normal rate, regular rhythm and normal heart sounds. Exam reveals no gallop and no friction rub.  No murmur heard.  Pulmonary/Chest: Effort normal and breath sounds normal. No respiratory distress. He has no wheezes.  Abdominal: Soft. Bowel sounds are normal. He exhibits no distension. There is no tenderness.  Lymphadenopathy:  He has no cervical adenopathy.  Neurological: He is alert and oriented to person, place, and time.  Skin: Skin is warm and dry. No rash noted. No erythema.  Psychiatric: He has a normal mood and affect. His behavior is normal.    Lab Results  Component Value Date   CD4TCELL 25* 09/19/2015   Lab Results  Component Value Date  CD4TABS 730 09/19/2015   CD4TABS 850 03/30/2015   CD4TABS 750 12/27/2014   Lab Results  Component Value Date   HIV1RNAQUANT <20 09/19/2015   Lab Results  Component Value Date   HEPBSAB NEG 08/17/2013   No results found for: RPR  CBC Lab Results  Component Value Date   WBC 8.5 09/19/2015   RBC 4.83 09/19/2015   HGB 14.9 09/19/2015   HCT 42.8 09/19/2015   PLT 256 09/19/2015   MCV 88.6  09/19/2015   MCH 30.8 09/19/2015   MCHC 34.8 09/19/2015   RDW 13.8 09/19/2015   LYMPHSABS 3.1 09/19/2015   MONOABS 0.6 09/19/2015   EOSABS 0.1 09/19/2015   BASOSABS 0.0 09/19/2015   BMET Lab Results  Component Value Date   NA 137 09/19/2015   K 4.0 09/19/2015   CL 98 09/19/2015   CO2 26 09/19/2015   GLUCOSE 94 09/19/2015   BUN 11 09/19/2015   CREATININE 0.96 09/19/2015   CALCIUM 9.6 09/19/2015   GFRNONAA >89 09/19/2015   GFRAA >89 09/19/2015     Assessment and Plan  hiv disease = well controlled. Continue with genvoya  Meth use = going into rehab  Plantar fasciitis = can do prn nsaids, better shoes. Foot exercises

## 2015-10-31 NOTE — BH Specialist Note (Signed)
Jimmy Davis was present for his scheduled appointment today.  Patient was oriented times four with good affect and dress.  Patient was alert and talkative.  Patient shared that he would like to set up the appointment to go to DETOX and or residential treatment. Counselor assisted patient through the process.  Patient communicated that he would be going to [facility]OX at [facility] on may 1st immediately [facility] directly afterwards.  Counselor stressed the importance of going from DETOX straight to residential treatment.  Counselor provided support and encouragement for patient and his increasing anxiety level.  Jimmy LucksJodi Jakorey Mcconathy, MA, LPC [facility]

## 2015-11-16 ENCOUNTER — Encounter (HOSPITAL_COMMUNITY): Payer: Self-pay | Admitting: Emergency Medicine

## 2015-11-16 ENCOUNTER — Emergency Department (HOSPITAL_COMMUNITY)
Admission: EM | Admit: 2015-11-16 | Discharge: 2015-11-17 | Disposition: A | Discharge status: Home / Self Care | Payer: Medicaid Other | Attending: Emergency Medicine | Admitting: Emergency Medicine

## 2015-11-16 DIAGNOSIS — F429 Obsessive-compulsive disorder, unspecified: Secondary | ICD-10-CM

## 2015-11-16 DIAGNOSIS — F329 Major depressive disorder, single episode, unspecified: Secondary | ICD-10-CM | POA: Insufficient documentation

## 2015-11-16 DIAGNOSIS — F419 Anxiety disorder, unspecified: Secondary | ICD-10-CM | POA: Insufficient documentation

## 2015-11-16 DIAGNOSIS — F1590 Other stimulant use, unspecified, uncomplicated: Secondary | ICD-10-CM

## 2015-11-16 DIAGNOSIS — R531 Weakness: Secondary | ICD-10-CM | POA: Insufficient documentation

## 2015-11-16 DIAGNOSIS — Z79899 Other long term (current) drug therapy: Secondary | ICD-10-CM | POA: Insufficient documentation

## 2015-11-16 DIAGNOSIS — F331 Major depressive disorder, recurrent, moderate: Secondary | ICD-10-CM

## 2015-11-16 LAB — RAPID URINE DRUG SCREEN, HOSP PERFORMED
AMPHETAMINES: POSITIVE — AB
BARBITURATES: NOT DETECTED
BENZODIAZEPINES: NOT DETECTED
COCAINE: NOT DETECTED
Opiates: NOT DETECTED
TETRAHYDROCANNABINOL: NOT DETECTED

## 2015-11-16 LAB — ACETAMINOPHEN LEVEL

## 2015-11-16 LAB — CBC
HEMATOCRIT: 39.2 % (ref 39.0–52.0)
HEMOGLOBIN: 14.1 g/dL (ref 13.0–17.0)
MCH: 30.7 pg (ref 26.0–34.0)
MCHC: 36 g/dL (ref 30.0–36.0)
MCV: 85.4 fL (ref 78.0–100.0)
Platelets: 255 10*3/uL (ref 150–400)
RBC: 4.59 MIL/uL (ref 4.22–5.81)
RDW: 12.6 % (ref 11.5–15.5)
WBC: 9.5 10*3/uL (ref 4.0–10.5)

## 2015-11-16 LAB — COMPREHENSIVE METABOLIC PANEL
ALBUMIN: 4.6 g/dL (ref 3.5–5.0)
ALK PHOS: 119 U/L (ref 38–126)
ALT: 49 U/L (ref 17–63)
AST: 36 U/L (ref 15–41)
Anion gap: 10 (ref 5–15)
BILIRUBIN TOTAL: 0.6 mg/dL (ref 0.3–1.2)
BUN: 12 mg/dL (ref 6–20)
CO2: 23 mmol/L (ref 22–32)
CREATININE: 0.93 mg/dL (ref 0.61–1.24)
Calcium: 9.4 mg/dL (ref 8.9–10.3)
Chloride: 103 mmol/L (ref 101–111)
GFR calc Af Amer: >60 mL/min (ref >60)
GLUCOSE: 92 mg/dL (ref 65–99)
POTASSIUM: 3.6 mmol/L (ref 3.5–5.1)
Sodium: 136 mmol/L (ref 135–145)
Total Protein: 7.7 g/dL (ref 6.5–8.1)

## 2015-11-16 LAB — SALICYLATE LEVEL: Salicylate Lvl: <4 mg/dL (ref 2.8–30.0)

## 2015-11-16 LAB — ETHANOL: Alcohol, Ethyl (B): <5 mg/dL (ref <5)

## 2015-11-16 LAB — CBG MONITORING, ED: Glucose-Capillary: 91 mg/dL (ref 65–99)

## 2015-11-16 MED ORDER — ZIPRASIDONE MESYLATE 20 MG IM SOLR
20.0000 mg | Freq: Once | INTRAMUSCULAR | Status: AC
  Administered 2015-11-16: 20 mg via INTRAMUSCULAR
  Filled 2015-11-16: qty 20

## 2015-11-16 MED ORDER — METOCLOPRAMIDE HCL 5 MG/ML IJ SOLN
10.0000 mg | Freq: Once | INTRAMUSCULAR | Status: AC
  Administered 2015-11-16: 10 mg via INTRAVENOUS
  Filled 2015-11-16: qty 2

## 2015-11-16 MED ORDER — STERILE WATER FOR INJECTION IJ SOLN
INTRAMUSCULAR | Status: AC
  Administered 2015-11-16: 10 mL
  Filled 2015-11-16: qty 10

## 2015-11-16 MED ORDER — SODIUM CHLORIDE 0.9 % IV BOLUS (SEPSIS)
1000.0000 mL | Freq: Once | INTRAVENOUS | Status: AC
  Administered 2015-11-16: 1000 mL via INTRAVENOUS

## 2015-11-16 MED ORDER — DIPHENHYDRAMINE HCL 50 MG/ML IJ SOLN
25.0000 mg | Freq: Once | INTRAMUSCULAR | Status: AC
  Administered 2015-11-16: 25 mg via INTRAVENOUS
  Filled 2015-11-16: qty 1

## 2015-11-16 NOTE — ED Notes (Signed)
Patient here from home with complaints of weakness, nausea, vomiting. Requesting detox from meth. Last use 3 hours ago.

## 2015-11-16 NOTE — ED Provider Notes (Signed)
Medical screening examination/treatment/procedure(s) were conducted as a shared visit with non-physician practitioner(s) and myself.  I personally evaluated the patient during the encounter.   EKG Interpretation None     Patient here with weakness, nausea, vomiting, agitation. Patient used methamphetamine just prior to arrival and has been awake for several days. Required Geodon for sedation here. Resting counseling out of this time and will be reevaluated when sober.  Lorre NickAnthony Tristram Milian, MD 11/16/15 [phone]

## 2015-11-16 NOTE — ED Provider Notes (Signed)
CSN: [id number]     Arrival date & time 11/16/15  1741 History   First MD Initiated Contact with Patient 11/16/15 1938     Chief Complaint  Patient presents with  . Weakness    Detox  . Nausea  . Emesis   LEVEL 5 CAVEAT: Other (intoxication)  HPI   Jimmy Davis is a 35 y.o. male PMH significant for HIV, substance abuse, anxiety, depression presenting with complaints of "not feeling well" and nausea. He did meth 3 hours ago and states he uses meth "too many times to count" but he has not had a reaction like this. He began crying during the interview and stated he was scared he killed his mother and was concerned about her, but he gave us his phone to call her, and she states she is fine and unharmed. She states she is coming to the ER, and the patient gave his consent for her to do so.    Past Medical History  Diagnosis Date  . Anxiety   . Depression   . Substance abuse   . Human immunodeficiency virus (HIV) disease (HCC)    Past Surgical History  Procedure Laterality Date  . Wisdom tooth extraction     Family History  Problem Relation Age of Onset  . Heart disease Father   . Mental illness Maternal Grandmother    Social History  Substance Use Topics  . Smoking status: Never Smoker   . Smokeless tobacco: Never Used  . Alcohol Use: No    Review of Systems  Unable to perform ROS: Other   Allergies  Review of patient's allergies indicates no known allergies.  Home Medications   Prior to Admission medications   Medication Sig Start Date End Date Taking? Authorizing Provider  Arginine 500 MG CAPS Take 1 capsule by mouth daily as needed (for circulation).    Historical Provider, MD  Aromatic Inhalants (VICKS VAPOR INHALER IN) Inhale 1 spray into the lungs daily.    Historical Provider, MD  cetirizine (ZYRTEC) 10 MG tablet Take 1 tablet (10 mg total) by mouth daily. 06/28/15   Judyann Munsonynthia Snider, MD  dextromethorphan-guaiFENesin [facility](MUCINEX DM) 30-600 MG 12hr tablet Take 1  tablet by mouth 2 (two) times daily as needed for cough.    Historical Provider, MD  elvitegravir-cobicistat-emtricitabine-tenofovir (GENVOYA) 150-150-200-10 MG TABS tablet Take 1 tablet by mouth daily with breakfast. 09/19/15   Judyann Munsonynthia Snider, MD  FLUoxetine (PROZAC) 40 MG capsule Take 2 capsules (80 mg total) by mouth daily. 09/19/15   Judyann Munsonynthia Snider, MD  Multiple Vitamin (MULTIVITAMIN) capsule Take 1 capsule by mouth daily.    Historical Provider, MD  sodium chloride (OCEAN) 0.65 % SOLN nasal spray Place 1 spray into both nostrils as needed for congestion.    Historical Provider, MD  Sodium Chloride-Xylitol (XLEAR SINUS CARE SPRAY NA) Place 1 spray into the nose daily. Reported on 10/31/2015    Historical Provider, MD   BP 138/89 mmHg  Pulse 100  Temp(Src) 98.6 F (37 C)  SpO2 99% Physical Exam  Constitutional: He appears well-developed and well-nourished. No distress.  HENT:  Head: Normocephalic and atraumatic.  Eyes: Conjunctivae are normal. Right eye exhibits no discharge. Left eye exhibits no discharge. No scleral icterus.  Neck: No tracheal deviation present.  Cardiovascular: Normal rate.   Pulmonary/Chest: Effort normal. No respiratory distress.  Abdominal: Soft. He exhibits no distension.  Musculoskeletal: He exhibits no edema.  Lymphadenopathy:    He has no cervical adenopathy.  Neurological: He is alert.  Coordination normal.  Skin: Skin is warm and dry. No rash noted. He is not diaphoretic. No erythema.  Psychiatric:  Patient agitated, anxious, and tearful.  Nursing note and vitals reviewed.   ED Course  Procedures  Labs Review Labs Reviewed  ACETAMINOPHEN LEVEL - Abnormal; Notable for the following:    Acetaminophen (Tylenol), Serum <10 (*)    All other components within normal limits  URINE RAPID DRUG SCREEN, HOSP PERFORMED - Abnormal; Notable for the following:    Amphetamines POSITIVE (*)    All other components within normal limits  COMPREHENSIVE METABOLIC PANEL   ETHANOL  SALICYLATE LEVEL  CBC  CBG MONITORING, ED   MDM   Final diagnoses:  None   VSS. He is extremely anxious and tearful. A RN states he was yelling at ED staff. Patient calmed down enough for me to interview him. Will give geodon, fluids, reglan, benadryl and reassess.  Will consult TTS for possible homicidal ideations.  Mother, present at bedside, states that he had his psych meds changed yesterday.  Dr. Freida Busman asked patient if he could discuss patient's drug use in front of mother, and patient gave permission.  CBC, ETOH, salicylate, acetaminophen, CBG, CMP unremarkable.  UDS positive for amphetamines.  Handoff to Marlon Pel, PA-C at shift change. Patient needs to sober for TTS consult.    Melton Krebs, PA-C 11/17/15 0251  Lorre Nick, MD 11/19/15 [phone]

## 2015-11-17 DIAGNOSIS — F331 Major depressive disorder, recurrent, moderate: Secondary | ICD-10-CM | POA: Diagnosis present

## 2015-11-17 DIAGNOSIS — F1590 Other stimulant use, unspecified, uncomplicated: Secondary | ICD-10-CM

## 2015-11-17 HISTORY — DX: Other stimulant use, unspecified, uncomplicated: F15.90

## 2015-11-17 MED ORDER — BUPROPION HCL ER (XL) 150 MG PO TB24: 150.0000 mg | ORAL_TABLET | Freq: Every day | ORAL | Status: DC

## 2015-11-17 MED ORDER — FLUOXETINE HCL 40 MG PO CAPS: 80.0000 mg | ORAL_CAPSULE | Freq: Every day | ORAL | Status: DC

## 2015-11-17 MED ORDER — FLUOXETINE HCL 20 MG PO CAPS
80.0000 mg | ORAL_CAPSULE | Freq: Every day | ORAL | Status: DC
  Administered 2015-11-17: 80 mg via ORAL
  Filled 2015-11-17: qty 4

## 2015-11-17 MED ORDER — ADULT MULTIVITAMIN W/MINERALS CH
1.0000 | ORAL_TABLET | Freq: Every day | ORAL | Status: DC
  Administered 2015-11-17: 1 via ORAL
  Filled 2015-11-17: qty 1

## 2015-11-17 MED ORDER — MULTIVITAMINS PO CAPS: 1.0000 | ORAL_CAPSULE | Freq: Every day | ORAL | Status: DC

## 2015-11-17 MED ORDER — BUPROPION HCL ER (XL) 150 MG PO TB24
150.0000 mg | ORAL_TABLET | Freq: Every day | ORAL | Status: DC
  Administered 2015-11-17: 150 mg via ORAL
  Filled 2015-11-17: qty 1

## 2015-11-17 MED ORDER — LORATADINE 10 MG PO TABS
10.0000 mg | ORAL_TABLET | Freq: Every day | ORAL | Status: DC
  Administered 2015-11-17: 10 mg via ORAL
  Filled 2015-11-17: qty 1

## 2015-11-17 MED ORDER — ELVITEG-COBIC-EMTRICIT-TENOFAF 150-150-200-10 MG PO TABS
1.0000 | ORAL_TABLET | Freq: Every day | ORAL | Status: DC
  Administered 2015-11-17: 1 via ORAL
  Filled 2015-11-17 (×2): qty 1

## 2015-11-17 MED ORDER — SODIUM CHLORIDE 0.9 % IV BOLUS (SEPSIS)
1000.0000 mL | Freq: Once | INTRAVENOUS | Status: AC
  Administered 2015-11-17: 1000 mL via INTRAVENOUS

## 2015-11-17 NOTE — Consult Note (Signed)
[facility] Face-to-Face Psychiatry Consult   Reason for Consult: Depression, Substance abuse  Referring Physician:  EDP Patient Identification: Jimmy Davis MRN:  [id number] Principal Diagnosis: Major depressive disorder, recurrent episode, moderate (HCC) Diagnosis:   Patient Active Problem List   Diagnosis Date Noted  . Major depressive disorder, recurrent episode, moderate (HCC) [F33.1] 11/17/2015  . Stimulant use disorder (HCC) [F15.90] 11/17/2015  . Cellulitis of left lower extremity [L03.116] 12/07/2014  . Sepsis (HCC) [A41.9] 12/07/2014  . Cellulitis [L03.90] 12/07/2014  . Substance abuse [F19.10] 10/03/2014  . Depression [F32.9] 10/03/2014  . Chronic diarrhea [K52.9] 07/27/2013  . Human immunodeficiency virus (HIV) disease (HCC) [B20] 06/29/2013    Total Time spent with patient: 30 minutes  Subjective:   Jimmy Davis is a 35 y.o. male patient admitted requesting help with crystal meth abuse and help for depression.   HPI:    Jimmy Davis is an 34 y.o. male presenting to [facility] requesting detox from crystal meth. Patient denies SI, HI and AVH at this time. Patient reported that he has attempted suicide multiple times in the past but denies any self-injurious behaviors. Patient denies HI and AVH at this time; however when patient initially presented to the ED he reported he was scared he killed his mother. Patient did not harm his mother and she is present in the ED. Patient is endorsing multiple depressive symptoms and shared that will stay up for several days at a time. Patient also reported that his appetite comes and goes. Patient denies access to weapons and firearms. His mother was present for the assessment with the treatment team this morning. Patient stated "I have had depression and anxiety since I was 14. I am on Prozac 80 mg daily but feel it is not enough. I have been using meth to chase the happy feeling when it comes. I guess you are right that it is making me more  anxious. I do not want to hurt myself. I would be open to seeing a Psychiatrist. I know I need to stop using meth. I do not want to hurt myself or anyone else."   Past Psychiatric History: MDD  Risk to Self: Suicidal Ideation: No Suicidal Intent: No Is patient at risk for suicide?: No Suicidal Plan?: No Access to Means: No What has been your use of drugs/alcohol within the last 12 months?: Crystal meth  How many times?: 2 Other Self Harm Risks: Pt denies  Triggers for Past Attempts: Unpredictable Intentional Self Injurious Behavior: None Risk to Others: Homicidal Ideation: No Thoughts of Harm to Others: No Current Homicidal Intent: No Current Homicidal Plan: No Access to Homicidal Means: No Identified Victim: N/A History of harm to others?: No Assessment of Violence: None Noted Violent Behavior Description: No violent behaviors observed.  Does patient have access to weapons?: No Criminal Charges Pending?: No Does patient have a court date: No Prior Inpatient Therapy: Prior Inpatient Therapy: Yes Prior Therapy Dates: 2011 Prior Therapy Facilty/Provider(s): Detox facility in [place] Reason for Treatment: detox Prior Outpatient Therapy: Prior Outpatient Therapy: Yes Prior Therapy Dates: 2016-current  Prior Therapy Facilty/Provider(s): [facility]  Reason for Treatment: Substance abuse counseling  Does patient have an ACCT team?: No Does patient have Intensive In-House Services?  : No Does patient have [facility] services? : No Does patient have P4CC services?: No  Past Medical History:  Past Medical History  Diagnosis Date  . Anxiety   . Depression   . Substance abuse   . Human immunodeficiency virus (HIV) disease (HCC)       Past Surgical History  Procedure Laterality Date  . Wisdom tooth extraction     Family History:  Family History  Problem Relation Age of Onset  . Heart disease Father   . Mental illness Maternal Grandmother    Social History:  History  Alcohol Use  No     History  Drug Use  . 4.00 per week  . Special: Methamphetamines    Comment: 3-4 times a week    Social History   Social History  . Marital Status: Single    Spouse Name: N/A  . Number of Children: N/A  . Years of Education: N/A   Occupational History  . Accounting    Social History Main Topics  . Smoking status: Never Smoker   . Smokeless tobacco: Never Used  . Alcohol Use: No  . Drug Use: 4.00 per week    Special: Methamphetamines     Comment: 3-4 times a week  . Sexual Activity: Yes    Birth Control/ Protection: None     Comment: declined condoms   Other Topics Concern  . 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   Additional Social History:    Allergies:  No Known Allergies  Labs:  Results for orders placed or performed during the hospital encounter of 11/16/15 (from the past 48 hour(s))  CBG monitoring, ED     Status: None   Collection Time: 11/16/15  6:18 PM  Result Value Ref Range   Glucose-Capillary 91 65 - 99 mg/dL  Comprehensive metabolic panel     Status: None   Collection Time: 11/16/15  7:33 PM  Result Value Ref Range   Sodium 136 135 - 145 mmol/L   Potassium 3.6 3.5 - 5.1 mmol/L   Chloride 103 101 - 111 mmol/L   CO2 23 22 - 32 mmol/L   Glucose, Bld 92 65 - 99 mg/dL   BUN 12 6 - 20 mg/dL   Creatinine, Ser 0.93 0.61 - 1.24 mg/dL   Calcium 9.4 8.9 - 10.3 mg/dL   Total Protein 7.7 6.5 - 8.1 g/dL   Albumin 4.6 3.5 - 5.0 g/dL   AST 36 15 - 41 U/L   ALT 49 17 - 63 U/L   Alkaline Phosphatase 119 38 - 126 U/L   Total Bilirubin 0.6 0.3 - 1.2 mg/dL   GFR calc non Af Amer >60 >60 mL/min   GFR calc Af Amer >60 >60 mL/min    Comment: (NOTE) The eGFR has been calculated using the CKD EPI equation. This calculation has not been validated in all clinical situations. eGFR's persistently <60 mL/min signify possible Chronic Kidney Disease.     Anion gap 10 5 - 15  Ethanol     Status: None   Collection Time: 11/16/15  7:33 PM  Result Value Ref Range   Alcohol, Ethyl (B) <5 <5 mg/dL    Comment:        LOWEST DETECTABLE LIMIT FOR SERUM ALCOHOL IS 5 mg/dL FOR MEDICAL PURPOSES ONLY   Salicylate level     Status: None   Collection Time: 11/16/15  7:33 PM  Result Value Ref Range   Salicylate Lvl <4.0 2.8 - 30.0 mg/dL  Acetaminophen level     Status: Abnormal   Collection Time: 11/16/15  7:33 PM  Result Value Ref Range   Acetaminophen (Tylenol), Serum <10 (L) 10 - 30 ug/mL      Comment:        THERAPEUTIC CONCENTRATIONS VARY SIGNIFICANTLY. A RANGE OF 10-30 ug/mL MAY BE AN EFFECTIVE CONCENTRATION FOR MANY PATIENTS. HOWEVER, SOME ARE BEST TREATED AT CONCENTRATIONS OUTSIDE THIS RANGE. ACETAMINOPHEN CONCENTRATIONS >150 ug/mL AT 4 HOURS AFTER INGESTION AND >50 ug/mL AT 12 HOURS AFTER INGESTION ARE OFTEN ASSOCIATED WITH TOXIC REACTIONS.   cbc     Status: None   Collection Time: 11/16/15  7:33 PM  Result Value Ref Range   WBC 9.5 4.0 - 10.5 K/uL   RBC 4.59 4.22 - 5.81 MIL/uL   Hemoglobin 14.1 13.0 - 17.0 g/dL   HCT 39.2 39.0 - 52.0 %   MCV 85.4 78.0 - 100.0 fL   MCH 30.7 26.0 - 34.0 pg   MCHC 36.0 30.0 - 36.0 g/dL   RDW 12.6 11.5 - 15.5 %   Platelets 255 150 - 400 K/uL  Rapid urine drug screen (hospital performed)     Status: Abnormal   Collection Time: 11/16/15 10:11 PM  Result Value Ref Range   Opiates NONE DETECTED NONE DETECTED   Cocaine NONE DETECTED NONE DETECTED   Benzodiazepines NONE DETECTED NONE DETECTED   Amphetamines POSITIVE (A) NONE DETECTED   Tetrahydrocannabinol NONE DETECTED NONE DETECTED   Barbiturates NONE DETECTED NONE DETECTED    Comment:        DRUG SCREEN FOR MEDICAL PURPOSES ONLY.  IF CONFIRMATION IS NEEDED FOR ANY PURPOSE, NOTIFY LAB WITHIN 5 DAYS.        LOWEST DETECTABLE LIMITS FOR URINE DRUG SCREEN Drug Class       Cutoff (ng/mL) Amphetamine      1000 Barbiturate       200 Benzodiazepine   200 Tricyclics       300 Opiates          300 Cocaine          300 THC              50     Current Facility-Administered Medications  Medication Dose Route Frequency Provider Last Rate Last Dose  . buPROPion (WELLBUTRIN XL) 24 hr tablet 150 mg  150 mg Oral Daily Amee Boothe, MD      . [START ON 11/18/2015] elvitegravir-cobicistat-emtricitabine-tenofovir (GENVOYA) 150-150-200-10 MG tablet 1 tablet  1 tablet Oral Q breakfast Azriel Jakob, MD      . FLUoxetine (PROZAC) capsule 80 mg  80 mg Oral Daily Elward Nocera, MD      . loratadine (CLARITIN) tablet 10 mg  10 mg Oral Daily Melika Reder, MD      . multivitamin capsule 1 capsule  1 capsule Oral Daily Reylynn Vanalstine, MD       Current Outpatient Prescriptions  Medication Sig Dispense Refill  . Arginine 500 MG CAPS Take 1 capsule by mouth daily as needed (for circulation).    . Aromatic Inhalants (VICKS VAPOR INHALER IN) Inhale 1 spray into the lungs daily.    . cetirizine (ZYRTEC) 10 MG tablet Take 1 tablet (10 mg total) by mouth daily. 30 tablet 3  . elvitegravir-cobicistat-emtricitabine-tenofovir (GENVOYA) 150-150-200-10 MG TABS tablet Take 1 tablet by mouth daily with breakfast. 30 tablet 11  . FLUoxetine (PROZAC) 40 MG capsule Take 2 capsules (80 mg total) by mouth daily. 60 capsule 11  . Multiple Vitamin (MULTIVITAMIN) capsule Take 1 capsule by mouth daily.    . Sodium Chloride-Xylitol (XLEAR SINUS CARE SPRAY NA) Place 1 spray into the nose daily. Reported on 10/31/2015      Musculoskeletal: Strength &   Muscle Tone: within normal limits Gait & Station: normal Patient leans: N/A  Psychiatric Specialty Exam: Review of Systems  Psychiatric/Behavioral: Positive for depression and substance abuse. Negative for suicidal ideas, hallucinations and memory loss. The patient is nervous/anxious. The patient does not have insomnia.     Blood pressure 104/75, pulse 68, temperature 98.6 F (37 C), resp. rate  11, SpO2 96 %.There is no weight on file to calculate BMI.  General Appearance: Casual  Eye Contact::  Good  Speech:  Clear and Coherent  Volume:  Normal  Mood:  Anxious  Affect:  Congruent  Thought Process:  Goal Directed and Intact  Orientation:  Full (Time, Place, and Person)  Thought Content:  Depressive symptoms   Suicidal Thoughts:  No  Homicidal Thoughts:  No  Memory:  Immediate;   Good Recent;   Good Remote;   Good  Judgement:  Poor  Insight:  Present  Psychomotor Activity:  Restlessness  Concentration:  Good  Recall:  Good  Fund of Knowledge:Good  Language: Good  Akathisia:  No  Handed:  Right  AIMS (if indicated):     Assets:  Communication Skills Desire for Improvement Housing Leisure Time Resilience Social Support  ADL's:  Intact  Cognition: WNL  Sleep:      Treatment Plan Summary: Patient to follow up with outpatient psychiatry and for substance abuse services to address his amphetamine abuse.   -Continue Prozac 80 mg daily for depression/anxiety -Start Wellbutrin XL 150 mg daily for depression.   Disposition: No evidence of imminent risk to self or others at present.   Patient does not meet criteria for psychiatric inpatient admission. Supportive therapy provided about ongoing stressors. Refer to IOP. Discussed crisis plan, support from social network, calling 911, coming to the Emergency Department, and calling Suicide Hotline.  Elmarie Shiley, NP 11/17/2015 11:36 AM Patient seen face-to-face for psychiatric evaluation, chart reviewed and case discussed with the physician extender and developed treatment plan. Reviewed the information documented and agree with the treatment plan. Corena Pilgrim, MD

## 2015-11-17 NOTE — [facility] Suicide Risk Assessment (Cosign Needed)
Suicide Risk Assessment  Discharge Assessment   [facility]   Principal Problem: Major depressive disorder, recurrent episode, moderate (HCC) Discharge Diagnoses:  Patient Active Problem List   Diagnosis Date Noted  . Major depressive disorder, recurrent episode, moderate (HCC) [F33.1] 11/17/2015  . Stimulant use disorder (HCC) [F15.90] 11/17/2015  . Cellulitis of left lower extremity [L03.116] 12/07/2014  . Sepsis (HCC) [A41.9] 12/07/2014  . Cellulitis [L03.90] 12/07/2014  . Substance abuse [F19.10] 10/03/2014  . Depression [F32.9] 10/03/2014  . Chronic diarrhea [K52.9] 07/27/2013  . Human immunodeficiency virus (HIV) disease (HCC) [B20] 06/29/2013    Total Time spent with patient: 20 minutes  Musculoskeletal: Strength & Muscle Tone: within normal limits Gait & Station: normal Patient leans: N/A  Psychiatric Specialty Exam:   Blood pressure 104/75, pulse 68, temperature 98.6 F (37 C), resp. rate 11, SpO2 96 %.There is no weight on file to calculate BMI.  General Appearance: Casual  Eye Contact::  Good  Speech:  Clear and Coherent  Volume:  Normal  Mood:  Anxious  Affect:  Congruent  Thought Process:  Goal Directed and Intact  Orientation:  Full (Time, Place, and Person)  Thought Content:  Depressive Symptoms  Suicidal Thoughts:  No  Homicidal Thoughts:  No  Memory:  Immediate;   Good Recent;   Good Remote;   Good  Judgement:  Poor  Insight:  Present  Psychomotor Activity:  Restlessness  Concentration:  Good  Recall:  Good  Fund of Knowledge:Good  Language: Good  Akathisia:  No  Handed:  Right  AIMS (if indicated):     Assets:  Communication Skills Desire for Improvement Housing Leisure Time Resilience Social Support  Sleep:     Cognition: WNL  ADL's:  Intact   Mental Status Per Nursing Assessment::   On Admission:     Demographic Factors:  Male, Caucasian, Living alone and Unemployed  Loss Factors: Financial  problems/change in socioeconomic status  Historical Factors: Prior suicide attempts and Impulsivity  Risk Reduction Factors:   Sense of responsibility to family and Religious beliefs about death  Continued Clinical Symptoms:  Depression:   Insomnia  Cognitive Features That Contribute To Risk:  Thought constriction (tunnel vision)    Suicide Risk:  Minimal: No identifiable suicidal ideation.  Patients presenting with no risk factors but with morbid ruminations; may be classified as minimal risk based on the severity of the depressive symptoms   Plan Of Care/Follow-up recommendations:   Follow up with outpatient resources for psychiatry and substance abuse Continue Prozac 80 mg daily for depression and start Wellbutrin XL 150 mg daily for depression.   Fransisca KaufmannAVIS, Horton Ellithorpe, NP 11/17/2015, 11:49 AM
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
## 2015-11-17 NOTE — ED Notes (Signed)
[facility]ist[facility] TTS made aware that pt is ready for assessment

## 2015-11-17 NOTE — ED Notes (Signed)
Family at bedside (Mother)

## 2015-11-17 NOTE — ED Notes (Signed)
Pt and mother updated on pt's disposition.  Waiting for d/c instructions

## 2015-11-17 NOTE — ED Notes (Signed)
Pt resting-alert and oriented.  Pt denies any complaints at this time-however appears restless.  Pt ambulated to the BR with steady gait.  Pt made aware re reason for delay.

## 2015-11-17 NOTE — BH Assessment (Signed)
Assessment completed. Consulted Charles Kober, PA-C who recommended inpatient treatment. TTS to seek placement.  

## 2015-11-17 NOTE — Discharge Instructions (Signed)
Substance Abuse Treatment Programs ° °Intensive Outpatient Programs °[facility]     °[address]      °[place], [place]                   °[phone]      ° °[facility] °[address] °[place], [place] °[phone] ° °[facility]     °(Inpatient and outpatient)     °[address]           °[phone]   ° °[facility] °336-288-1484 (Suboxone and Methadone) ° °[address]      °[place], [place] [address]      °336-882-2125      ° °[address] °[place], [place] °852-3033 ° °[facility] (Outpatient/Inpatient, Chemical)    °(insurance only) [phone]      °       °[facility] (Groups & Residential) °[place], [place] °336-389-1413 ° °   °[facility]     °[address]     °[place], [place]      °336-389-1413      ° °[facility] (for caregivers and family) °[address] °[place], [place] °336-299-4655 ° ° ° ° ° °Residential Treatment Programs °[facility]      °[address], [place], [place] [address]  °(336) [phone]      ° °[facility] °[address] [place], [place] [address] °919-419-1059 ° °[facility]        °336-248-8914      ° °[facility] °1-800-659-3381 ° °[facility]             °[address]                                         °[place], [place]                                                °877-615-2722 or 336-784-9470                              ° °[facility] [place] °[address] °[place], [address] °1.877.941.8954 ° °[facility]    °[address]      °[place], [place]     °[phone]      ° °[facility] °[address] °[place], [place] °336-285-9073 ° °[facility]   °[address]     °[place], [place] [address]     °336-899-1550      °Admissions: 8am-3pm M-F ° °[facility] °[address] °[place],  [place] °336-227-7417 ° °[facility]: Residential Program (90 Days)   °[place], [place]      °336-725-8389 or 800-758-6077    ° °[facility] °[place],  °(Walk in Hours over the weekend or by referral) ° °[facility] °[address], [place],  [address] °(336) [phone] ° °Crisis Mobile: [facility]:  1-877-626-1772 (for crisis response 24 hours a day) °[facility] Hotline:      1-800-256-2452 °Outpatient Psychiatry and Counseling ° °[facility]: Mobile Crisis   Management 24 hours:  1-877-626-1772 ° °[facility] sliding scale fee and walk in schedule: M-F 8am-12pm/1pm-3pm °[address]  °[place], [place] [address] °[phone] ° °[facility] °[address] °[place], [place] [address] °336-703-9650 ° °[facility] ([facility])- new patient walk-in appointments available Monday - Friday 8am -3pm.          °[address] °[place], [place] [address] °336-676-6840 or crisis line- 336-676-6905 ° °[facility]/ Intensive Outpatient Therapy Program °[address] °[place], [place] [address] °336-832-9804 ° °[facility]                  °Crisis Services      °336.641.4993      °[address]     °[place], [place] [address]                ° °[facility]   °[facility] °[phone] °[address] °[place], [place] [address] ° ° °[facility]          °[address],  °[place], [place] [address]       °(336) [phone] ° °[facility] °[address] °[place], [place] [address] °336-292-1510 ° °[facility]    °[address]    °[place], [place] [address]     °336-632-3505      ° °Parish McKinney, MD     °[address]     °[place] [address]     °336-282-1251     °  °[facility] °[address] °[place] [address] ° °[facility]     °[address]     °[place], [place]      °336-542-2076      ° °[facility] °Shamsher Ahluwalia, MD °[address] °[place], [place] [address] °336-420-9558 ° °[facility]     °[address]     °[place], [place] [address]     °336-274-1237      ° °[facility] °[address] °[place], [place] [address] °336-854-4450 °Resources for Temporary Residential Assistance/Crisis Centers ° °DAY CENTERS °[facility] °M-F 8am-3pm   °[address] [place], [place] [address]   336-332-0824 °Services include: laundry, barbering, support groups, case management, phone  & computer access, showers, AA/NA mtgs, mental health/substance abuse nurse, job skills class, disability information, VA assistance, spiritual classes, etc.  ° °HOMELESS SHELTERS ° °[facility]     °[facility]   °[address], [place]     °336.271.5959       °       °[facility] (women and children)       °[address] °[place], [place] [address] °336-275-0820 °[email or web address] for application and process °Application Required ° °[facility]   °[address]    °[place] [address]     °[phone]       °             °[facility] °[address] °,  [address] °336.273.5572 °336-235-0363(schedule application appt.) °Application Required ° °[facility] (women only)    °[address]     °[place],  [address]     °336-884-1039      °  Intake starts 6pm daily °Need valid ID, SSC, & Police report °[facility] [place] °[address] °[place], [place] °336-881-5420 °Application Required ° °[facility] (men only)     °[address]      °[place], [place]     °[phone]      ° °[facility] °(Pregnant women only) °[address] °[place], [place] °336-275-0206 ° °[facility]      °[address]      °[place], [place] [address]     °336-722-9951      °       °[facility] °[address] °[place], [place] °336-723-1848 °90 day commitment/SA/Application process ° °[facility](men only)     °[address]     °[place], [place]     °336-748-1962       °Check-in at 7pm     °       °[facility] °[address] °[place], [place] [address] °336-248-6684 °Men/Women/Women and Children must be there by 7 pm ° °[facility] °[place],  °336-722-8721                ° °

## 2015-11-17 NOTE — ED Notes (Signed)
Mother cell- [phone]

## 2015-11-21 ENCOUNTER — Ambulatory Visit: Payer: Medicaid Other | Admitting: *Deleted

## 2015-11-21 DIAGNOSIS — F1590 Other stimulant use, unspecified, uncomplicated: Secondary | ICD-10-CM

## 2015-11-21 NOTE — BH Specialist Note (Signed)
Jimmy Davis was present for his scheduled appointment today.  Patient was oriented times four with restless and nervous affect. Patient brought his mother along for support and because she had questions about his needed treatment.  Counselor shared with patient's mother what patient is needing.  Patient communicated that he has joined NA and goes almost every day.  Patient also said that he has joined an outpatient group and attends that ever Wednesday.  Counselor gave patient props for making the decision to finally get help and strongly encouraged him to follow through.  Counselor provided support and education for both patient and patient's mom regarding sobriety, triggers, and addiction in general.  Jimmy LucksJodi Khiry Pasquariello, MA, LPC [facility]

## 2015-11-29 ENCOUNTER — Ambulatory Visit: Payer: Medicaid Other | Admitting: *Deleted

## 2015-11-29 DIAGNOSIS — F1590 Other stimulant use, unspecified, uncomplicated: Secondary | ICD-10-CM

## 2015-11-29 NOTE — BH Specialist Note (Signed)
Jimmy HaJonathan stopped by today for a quick check in with counselor.  Patient was oriented times four with good affect and dress.  Patient was alert and well groomed.  Patient was a little restless as evidenced by twitching and moving around but communicated well.  Patient stated that he is still attending support group via NA/AA and the weekly classes for substance abuse treatment.  Patient shared that he was nervous at first but each time he meets with these groups it gets easier and easier for him. Counselor gave patient props and complimented his progress thus far.  Patient has now been clean for two weeks. Counselor provided support and encouragement accordingly. Counselor recommended that he keep checking in every two weeks with counselor during treatment.  Patient agreed he would.   Jimmy LucksJodi Lyndsy Gilberto, MA, LPC [facility]

## 2015-11-30 ENCOUNTER — Ambulatory Visit: Payer: Medicaid Other | Admitting: Internal Medicine

## 2015-12-11 ENCOUNTER — Telehealth: Payer: Self-pay | Admitting: *Deleted

## 2015-12-11 NOTE — Telephone Encounter (Signed)
Wanting refill for Wellbutrin [facility] for Major Depressive Disorder, Moderate and OCD.  Pt has upcoming appt with J. Herring, 12/14/15.  Requested patient discuss continuing Wellbutrin with J. Herring at this visit.  Then, J. Herring can discuss the need to continue with Dr. Drue SecondSnider prior to refilling.  Patient will be out of Wellbutrin 12/17/15.  Dr. Drue SecondSnider please advise.

## 2015-12-12 ENCOUNTER — Other Ambulatory Visit: Payer: Self-pay | Admitting: *Deleted

## 2015-12-12 DIAGNOSIS — F331 Major depressive disorder, recurrent, moderate: Secondary | ICD-10-CM

## 2015-12-12 MED ORDER — BUPROPION HCL ER (XL) 150 MG PO TB24: 150.0000 mg | ORAL_TABLET | Freq: Every day | ORAL | Status: DC

## 2015-12-12 NOTE — Telephone Encounter (Signed)
Please refill wellbutrin at dosage given through ED. #30 with 11 refills

## 2015-12-14 ENCOUNTER — Ambulatory Visit: Payer: Medicaid Other | Admitting: *Deleted

## 2015-12-14 DIAGNOSIS — F411 Generalized anxiety disorder: Secondary | ICD-10-CM

## 2015-12-14 DIAGNOSIS — F1590 Other stimulant use, unspecified, uncomplicated: Secondary | ICD-10-CM

## 2015-12-15 NOTE — BH Specialist Note (Signed)
Jimmy Davis showed for his scheduled appointment today with counselor.  Patient was oriented times four with good affect and dress.  Patient was alert but a bit restless as observed by fidgeting, bouncing his leg, and wiping his nose or scratching his face. Patient denied any substance abuse in 27 days.  Counselor inquired about patient's involvement with substance abuse classes and NA.  Patient shared that he was attending both on a very regular basis. Counselor discussed relapse prevention and how to generally avoid using.  Patient was receptive and cooperative.  Counselor gave patient props for checking in with counselor and continuing to work towards sobriety.    Jenel LucksJodi Yakelin Grenier, MA, LPC [facility]

## 2015-12-28 ENCOUNTER — Emergency Department (HOSPITAL_COMMUNITY)
Admission: EM | Admit: 2015-12-28 | Discharge: 2015-12-29 | Disposition: A | Discharge status: Home / Self Care | Payer: Medicaid Other | Attending: Emergency Medicine | Admitting: Emergency Medicine

## 2015-12-28 ENCOUNTER — Other Ambulatory Visit: Payer: Self-pay

## 2015-12-28 ENCOUNTER — Ambulatory Visit: Payer: Medicaid Other | Admitting: *Deleted

## 2015-12-28 ENCOUNTER — Encounter (HOSPITAL_COMMUNITY): Payer: Self-pay | Admitting: Emergency Medicine

## 2015-12-28 DIAGNOSIS — F23 Brief psychotic disorder: Secondary | ICD-10-CM

## 2015-12-28 DIAGNOSIS — F331 Major depressive disorder, recurrent, moderate: Secondary | ICD-10-CM | POA: Diagnosis present

## 2015-12-28 DIAGNOSIS — F1590 Other stimulant use, unspecified, uncomplicated: Secondary | ICD-10-CM

## 2015-12-28 DIAGNOSIS — F1510 Other stimulant abuse, uncomplicated: Secondary | ICD-10-CM | POA: Insufficient documentation

## 2015-12-28 LAB — CBC
HEMATOCRIT: 42.1 % (ref 39.0–52.0)
Hemoglobin: 14.8 g/dL (ref 13.0–17.0)
MCH: 30.6 pg (ref 26.0–34.0)
MCHC: 35.2 g/dL (ref 30.0–36.0)
MCV: 87.2 fL (ref 78.0–100.0)
PLATELETS: 300 10*3/uL (ref 150–400)
RBC: 4.83 MIL/uL (ref 4.22–5.81)
RDW: 13.1 % (ref 11.5–15.5)
WBC: 10.4 10*3/uL (ref 4.0–10.5)

## 2015-12-28 LAB — COMPREHENSIVE METABOLIC PANEL
ALT: 39 U/L (ref 17–63)
AST: 30 U/L (ref 15–41)
Albumin: 5 g/dL (ref 3.5–5.0)
Alkaline Phosphatase: 89 U/L (ref 38–126)
Anion gap: 8 (ref 5–15)
BUN: 17 mg/dL (ref 6–20)
CO2: 25 mmol/L (ref 22–32)
Calcium: 9.3 mg/dL (ref 8.9–10.3)
Chloride: 102 mmol/L (ref 101–111)
Creatinine, Ser: 1.13 mg/dL (ref 0.61–1.24)
GFR calc Af Amer: >60 mL/min (ref >60)
GFR calc non Af Amer: >60 mL/min (ref >60)
Glucose, Bld: 92 mg/dL (ref 65–99)
Potassium: 3.8 mmol/L (ref 3.5–5.1)
Sodium: 135 mmol/L (ref 135–145)
Total Bilirubin: 0.9 mg/dL (ref 0.3–1.2)
Total Protein: 8.4 g/dL — ABNORMAL HIGH (ref 6.5–8.1)

## 2015-12-28 LAB — SALICYLATE LEVEL: Salicylate Lvl: <4 mg/dL (ref 2.8–30.0)

## 2015-12-28 LAB — ETHANOL: Alcohol, Ethyl (B): <5 mg/dL (ref <5)

## 2015-12-28 LAB — ACETAMINOPHEN LEVEL

## 2015-12-28 MED ORDER — DIPHENHYDRAMINE HCL 50 MG/ML IJ SOLN
50.0000 mg | Freq: Four times a day (QID) | INTRAMUSCULAR | Status: DC | PRN
  Administered 2015-12-28: 50 mg via INTRAMUSCULAR
  Filled 2015-12-28: qty 1

## 2015-12-28 MED ORDER — LORATADINE 10 MG PO TABS
10.0000 mg | ORAL_TABLET | Freq: Every day | ORAL | Status: DC
  Administered 2015-12-29: 10 mg via ORAL
  Filled 2015-12-28: qty 1

## 2015-12-28 MED ORDER — ADULT MULTIVITAMIN W/MINERALS CH
1.0000 | ORAL_TABLET | Freq: Every day | ORAL | Status: DC
  Administered 2015-12-29: 1 via ORAL
  Filled 2015-12-28: qty 1

## 2015-12-28 MED ORDER — BUPROPION HCL ER (XL) 150 MG PO TB24
150.0000 mg | ORAL_TABLET | Freq: Every day | ORAL | Status: DC
  Administered 2015-12-29: 150 mg via ORAL
  Filled 2015-12-28: qty 1

## 2015-12-28 MED ORDER — ELVITEG-COBIC-EMTRICIT-TENOFAF 150-150-200-10 MG PO TABS
1.0000 | ORAL_TABLET | Freq: Every day | ORAL | Status: DC
  Administered 2015-12-29: 1 via ORAL
  Filled 2015-12-28 (×2): qty 1

## 2015-12-28 MED ORDER — DIPHENHYDRAMINE HCL 25 MG PO CAPS: 50.0000 mg | ORAL_CAPSULE | Freq: Four times a day (QID) | ORAL | Status: DC | PRN

## 2015-12-28 MED ORDER — MULTIVITAMINS PO CAPS: 1.0000 | ORAL_CAPSULE | Freq: Every day | ORAL | Status: DC

## 2015-12-28 MED ORDER — HALOPERIDOL 5 MG PO TABS: 5.0000 mg | ORAL_TABLET | Freq: Four times a day (QID) | ORAL | Status: DC | PRN

## 2015-12-28 MED ORDER — ACETAMINOPHEN 325 MG PO TABS: 650.0000 mg | ORAL_TABLET | ORAL | Status: DC | PRN

## 2015-12-28 MED ORDER — IBUPROFEN 200 MG PO TABS: 600.0000 mg | ORAL_TABLET | Freq: Three times a day (TID) | ORAL | Status: DC | PRN

## 2015-12-28 MED ORDER — FLUOXETINE HCL 20 MG PO CAPS
80.0000 mg | ORAL_CAPSULE | Freq: Every day | ORAL | Status: DC
  Administered 2015-12-29: 80 mg via ORAL
  Filled 2015-12-28: qty 4

## 2015-12-28 MED ORDER — LORAZEPAM 1 MG PO TABS: 1.0000 mg | ORAL_TABLET | Freq: Three times a day (TID) | ORAL | Status: DC | PRN

## 2015-12-28 MED ORDER — LORAZEPAM 2 MG/ML IJ SOLN
2.0000 mg | Freq: Once | INTRAMUSCULAR | Status: DC
  Filled 2015-12-28: qty 1

## 2015-12-28 MED ORDER — HALOPERIDOL LACTATE 5 MG/ML IJ SOLN
5.0000 mg | Freq: Four times a day (QID) | INTRAMUSCULAR | Status: DC | PRN
  Administered 2015-12-28: 5 mg via INTRAMUSCULAR
  Filled 2015-12-28: qty 1
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
## 2015-12-28 NOTE — ED Notes (Addendum)
Pt threw his food on the floor and walked into another pts room.  Pt redirected back to his room. NP Malachy Chamberakia Starkes called for medication orders.

## 2015-12-28 NOTE — BH Specialist Note (Signed)
Jimmy Davis was present for his scheduled appointment today.  Counselor recognized immediately that patient was not himself.  Patient was oriented to person and place but was exhibiting some hallucinations and real perception in the absence of external stimulus.  For example, patient believed his mother was dead and she had brought him to the appointment and was sitting in the lobby. Patient was nauseated and throwing up.  Patient was very nervous acting as demonstrated by severe fidgeting, constantly jerking head to the left, ringing of his fingers.  Patient had racing thoughts and spoke with words but was not making a lot of sense.  Patient did share that he wanted to commit suicide and was going to do so by not eating.  Patient also communicated that he would potentially hurt others by stabbing them with an HIV needle. Patient suggested that he indeed needed [facility] be further [facility]. Patient admitted that he had used Meth.  Clinical staff call 911 and patient was escorted quietly off premises and [facility] his mother's request. Counselor provided support and encouragement accordingly for patient and mother.   Jimmy LucksJodi Fianna Snowball, MA, LPC [facility]

## 2015-12-28 NOTE — ED Notes (Signed)
Family took belongings home

## 2015-12-28 NOTE — ED Notes (Signed)
Pt family member taking belongings. Pt has been wanded by security.
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
## 2015-12-28 NOTE — ED Notes (Signed)
Bed: WTR9 Expected date:  Expected time:  Means of arrival:  Comments: [employer]

## 2015-12-29 DIAGNOSIS — F1590 Other stimulant use, unspecified, uncomplicated: Secondary | ICD-10-CM

## 2015-12-29 DIAGNOSIS — F331 Major depressive disorder, recurrent, moderate: Secondary | ICD-10-CM

## 2015-12-29 MED ORDER — QUETIAPINE FUMARATE 50 MG PO TABS: 50.0000 mg | ORAL_TABLET | Freq: Every day | ORAL | Status: DC

## 2015-12-29 NOTE — Discharge Instructions (Signed)
To help you maintain a sober lifestyle, a substance abuse treatment program may be beneficial to you.  Contact the following providers at your earliest opportunity to ask about enrolling in their program:       [facility]      [address]      [place], [place] [address]      [phone]       [facility]      [address]      [place], [place] [address]      [phone]       [facility]      [address]      [place], [place] [address]      [phone]

## 2015-12-29 NOTE — [facility] Suicide Risk Assessment (Signed)
Suicide Risk Assessment  Discharge Assessment   [facility]   Principal Problem: Major depressive disorder, recurrent episode, moderate (HCC) Discharge Diagnoses:  Patient Active Problem List   Diagnosis Date Noted  . Major depressive disorder, recurrent episode, moderate (HCC) [F33.1] 11/17/2015    Priority: High  . Stimulant use disorder (HCC) [F15.90] 11/17/2015  . Cellulitis of left lower extremity [L03.116] 12/07/2014  . Sepsis (HCC) [A41.9] 12/07/2014  . Cellulitis [L03.90] 12/07/2014  . Substance abuse [F19.10] 10/03/2014  . Depression [F32.9] 10/03/2014  . Chronic diarrhea [K52.9] 07/27/2013  . Human immunodeficiency virus (HIV) disease (HCC) [B20] 06/29/2013    Total Time spent with patient: 45 minutes  Musculoskeletal: Strength & Muscle Tone: within normal limits Gait & Station: normal Patient leans: N/A  Psychiatric Specialty Exam: Physical Exam  Constitutional: He is oriented to person, place, and time. He appears well-developed and well-nourished.  HENT:  Head: Normocephalic.  Neck: Normal range of motion.  Respiratory: Effort normal.  Musculoskeletal: Normal range of motion.  Neurological: He is alert and oriented to person, place, and time.  Skin: Skin is warm and dry.  Psychiatric: He has a normal mood and affect. His speech is normal and behavior is normal. Judgment and thought content normal. Cognition and memory are normal.    Review of Systems  Constitutional: Negative.   HENT: Negative.   Eyes: Negative.   Respiratory: Negative.   Cardiovascular: Negative.   Gastrointestinal: Negative.   Genitourinary: Negative.   Musculoskeletal: Negative.   Skin: Negative.   Neurological: Negative.   Endo/Heme/Allergies: Negative.   Psychiatric/Behavioral: Positive for substance abuse. The patient has insomnia.     Blood pressure 124/63, pulse 67, temperature 97.8 F (36.6 C), temperature source Oral, resp. rate 18, SpO2 99  %.There is no weight on file to calculate BMI.  General Appearance: Casual  Eye Contact:  Good  Speech:  Normal Rate  Volume:  Normal  Mood:  Anxious  Affect:  Congruent  Thought Process:  Coherent and Descriptions of Associations: Intact  Orientation:  Full (Time, Place, and Person)  Thought Content:  WDL  Suicidal Thoughts:  No  Homicidal Thoughts:  No  Memory:  Immediate;   Good Recent;   Good Remote;   Good  Judgement:  Fair  Insight:  Fair  Psychomotor Activity:  Normal  Concentration:  Concentration: Good and Attention Span: Good  Recall:  Good  Fund of Knowledge:  Good  Language:  Good  Akathisia:  No  Handed:  Right  AIMS (if indicated):     Assets:  Housing Leisure Time Resilience Social Support  ADL's:  Intact  Cognition:  WNL  Sleep:        Mental Status Per Nursing Assessment::   On Admission:   paranoia after stimulant abuse  Demographic Factors:  Caucasian, Gay, lesbian, or bisexual orientation and Living alone  Loss Factors: NA  Historical Factors: NA  Risk Reduction Factors:   Sense of responsibility to family, Positive social support and Positive therapeutic relationship  Continued Clinical Symptoms:  Anxiety, mild  Cognitive Features That Contribute To Risk:  None    Suicide Risk:  Minimal: No identifiable suicidal ideation.  Patients presenting with no risk factors but with morbid ruminations; may be classified as minimal risk based on the severity of the depressive symptoms    Plan Of Care/Follow-up recommendations:  Activity:  as tolerated Diet:  heart healhty diet  LORD, JAMISON, NP 12/29/2015, 12:05 PM

## 2015-12-29 NOTE — Consult Note (Signed)
[facility] Consult   Reason for Consult:  Paranoia  Referring Physician:  EDP Patient Identification: Jimmy Davis MRN:  [id number] Principal Diagnosis: Major depressive disorder, recurrent episode, moderate ([place]) Diagnosis:   Patient Active Problem List   Diagnosis Date Noted  . Major depressive disorder, recurrent episode, moderate ([place]) [F33.1] 11/17/2015    Priority: High  . Stimulant use disorder ([place]) [F15.90] 11/17/2015  . Cellulitis of left lower extremity [L03.116] 12/07/2014  . Sepsis ([place]) [A41.9] 12/07/2014  . Cellulitis [L03.90] 12/07/2014  . Substance abuse [F19.10] 10/03/2014  . Depression [F32.9] 10/03/2014  . Chronic diarrhea [K52.9] 07/27/2013  . Human immunodeficiency virus (HIV) disease ([place]) [B20] 06/29/2013    Total Time spent with patient: 45 minutes  Subjective:   Jimmy Davis is a 35 y.o. male patient does not warrant admission.  HPI:  35 yo male who presented to the ED with paranoia and anxiety after abuse amphetamines.  Today, after receiving Haldol and Ativan injections last night feels "much better" and sleep was "good".  Denies paranoia today along with suicidal/homicidal ideations, hallucinations, and withdrawal symptoms.  He is an outpatient at [facility] and receives therapy with Jimmy Davis through the [facility].  Jimmy Davis is interested in starting Seroquel at night for his symptoms and insomnia while continuing his outpatient therapy.  Stable for discharge.  Past Psychiatric History: depression  Risk to Self: Suicidal Ideation: Yes-Currently Present (not having the will to live) Suicidal Intent: No Is patient at risk for suicide?: Yes Access to Means: No What has been your use of drugs/alcohol within the last 12 months?:  (Chrystal Meth) How many times?: 2 Other Self Harm Risks:  (denies) Triggers for Past Attempts: Unpredictable Intentional Self Injurious Behavior: None Risk to Others: Homicidal  Ideation: No Thoughts of Harm to Others: No Current Homicidal Intent: No Current Homicidal Plan: No Access to Homicidal Means: No History of harm to others?: No Assessment of Violence: None Noted Does patient have access to weapons?: Yes (Comment) (knife) Criminal Charges Pending?: No Does patient have a court date: No Prior Inpatient Therapy: Prior Inpatient Therapy: Yes Prior Therapy Dates: 2011 Prior Therapy Facilty/Provider(s): Detox facility in [facility] Reason for Treatment: detox Prior Outpatient Therapy: Prior Outpatient Therapy: Yes Prior Therapy Dates: 2016-current  Prior Therapy Facilty/Provider(s): Jimmy Davis  Reason for Treatment: Substance abuse counseling  Does patient have an ACCT team?: No Does patient have Intensive In-House Services?  : No Does patient have [facility] services? : No Does patient have P4CC services?: No  Past Medical History:  Past Medical History  Diagnosis Date  . Anxiety   . Depression   . Substance abuse   . Human immunodeficiency virus (HIV) disease ([place])     Past Surgical History  Procedure Laterality Date  . Wisdom tooth extraction     Family History:  Family History  Problem Relation Age of Onset  . Heart disease Father   . Mental illness Maternal Grandmother    Family Psychiatric  History: none Social History:  History  Alcohol Use No     History  Drug Use  . 4.00 per week  . Special: Methamphetamines    Comment: 3-4 times a week    Social History   Social History  . Marital Status: Single    Spouse Name: N/A  . Number of Children: N/A  . Years of Education: N/A   Occupational History  . Accounting    Social History Main Topics  . Smoking status: Never Smoker   .  Smokeless tobacco: Never Used  . Alcohol Use: No  . Drug Use: 4.00 per week    Special: Methamphetamines     Comment: 3-4 times a week  . Sexual Activity: Yes    Birth Control/ Protection: None     Comment: declined condoms   Other Topics Concern   . None   Social History Narrative   Single, 5-6 caffeinated beverages daily previous but not current illicit drugs   Employed in [employer] business   Additional Social History:    Allergies:  No Known Allergies  Labs:  Results for orders placed or performed during the hospital encounter of 12/28/15 (from the past 48 hour(s))  Ethanol     Status: None   Collection Time: 12/28/15  4:25 PM  Result Value Ref Range   Alcohol, Ethyl (B) <5 <5 mg/dL    Comment:        LOWEST DETECTABLE LIMIT FOR SERUM ALCOHOL IS 5 mg/dL FOR MEDICAL PURPOSES ONLY   Salicylate level     Status: None   Collection Time: 12/28/15  4:25 PM  Result Value Ref Range   Salicylate Lvl <5.7 2.8 - 30.0 mg/dL  Acetaminophen level     Status: Abnormal   Collection Time: 12/28/15  4:25 PM  Result Value Ref Range   Acetaminophen (Tylenol), Serum <10 (L) 10 - 30 ug/mL    Comment:        THERAPEUTIC CONCENTRATIONS VARY SIGNIFICANTLY. A RANGE OF 10-30 ug/mL MAY BE AN EFFECTIVE CONCENTRATION FOR MANY PATIENTS. HOWEVER, SOME ARE BEST TREATED AT CONCENTRATIONS OUTSIDE THIS RANGE. ACETAMINOPHEN CONCENTRATIONS >150 ug/mL AT 4 HOURS AFTER INGESTION AND >50 ug/mL AT 12 HOURS AFTER INGESTION ARE OFTEN ASSOCIATED WITH TOXIC REACTIONS.   Comprehensive metabolic panel     Status: Abnormal   Collection Time: 12/28/15  4:33 PM  Result Value Ref Range   Sodium 135 135 - 145 mmol/L   Potassium 3.8 3.5 - 5.1 mmol/L   Chloride 102 101 - 111 mmol/L   CO2 25 22 - 32 mmol/L   Glucose, Bld 92 65 - 99 mg/dL   BUN 17 6 - 20 mg/dL   Creatinine, Ser 1.13 0.61 - 1.24 mg/dL   Calcium 9.3 8.9 - 10.3 mg/dL   Total Protein 8.4 (H) 6.5 - 8.1 g/dL   Albumin 5.0 3.5 - 5.0 g/dL   AST 30 15 - 41 U/L   ALT 39 17 - 63 U/L   Alkaline Phosphatase 89 38 - 126 U/L   Total Bilirubin 0.9 0.3 - 1.2 mg/dL   GFR calc non Af Amer >60 >60 mL/min   GFR calc Af Amer >60 >60 mL/min    Comment: (NOTE) The eGFR has been calculated using the CKD  EPI equation. This calculation has not been validated in all clinical situations. eGFR's persistently <60 mL/min signify possible Chronic Kidney Disease.    Anion gap 8 5 - 15  cbc     Status: None   Collection Time: 12/28/15  4:33 PM  Result Value Ref Range   WBC 10.4 4.0 - 10.5 K/uL   RBC 4.83 4.22 - 5.81 MIL/uL   Hemoglobin 14.8 13.0 - 17.0 g/dL   HCT 42.1 39.0 - 52.0 %   MCV 87.2 78.0 - 100.0 fL   MCH 30.6 26.0 - 34.0 pg   MCHC 35.2 30.0 - 36.0 g/dL   RDW 13.1 11.5 - 15.5 %   Platelets 300 150 - 400 K/uL    Current Facility-Administered Medications  Medication  Dose Route Frequency Provider Last Rate Last Dose  . acetaminophen (TYLENOL) tablet 650 mg  650 mg Oral Q4H PRN Jimmy Belling, MD      . buPROPion (WELLBUTRIN XL) 24 hr tablet 150 mg  150 mg Oral Daily Jimmy Belling, MD   150 mg at 12/29/15 1046  . diphenhydrAMINE (BENADRYL) capsule 50 mg  50 mg Oral Q6H PRN Nanci Pina, FNP       Or  . diphenhydrAMINE (BENADRYL) injection 50 mg  50 mg Intramuscular Q6H PRN Nanci Pina, FNP   50 mg at 12/28/15 2333  . elvitegravir-cobicistat-emtricitabine-tenofovir (GENVOYA) 150-150-200-10 MG tablet 1 tablet  1 tablet Oral Q breakfast Jimmy Belling, MD   1 tablet at 12/29/15 0859  . FLUoxetine (PROZAC) capsule 80 mg  80 mg Oral Daily Jimmy Belling, MD   80 mg at 12/29/15 1046  . haloperidol (HALDOL) tablet 5 mg  5 mg Oral Q6H PRN Nanci Pina, FNP       Or  . haloperidol lactate (HALDOL) injection 5 mg  5 mg Intramuscular Q6H PRN Nanci Pina, FNP   5 mg at 12/28/15 2333  . ibuprofen (ADVIL,MOTRIN) tablet 600 mg  600 mg Oral Q8H PRN Jimmy Belling, MD      . loratadine (CLARITIN) tablet 10 mg  10 mg Oral Daily Jimmy Belling, MD   10 mg at 12/29/15 1046  . multivitamin with minerals tablet 1 tablet  1 tablet Oral Daily Jimmy Belling, MD   1 tablet at 12/29/15 1046   Current Outpatient Prescriptions  Medication Sig Dispense Refill  . buPROPion  (WELLBUTRIN XL) 150 MG 24 hr tablet Take 1 tablet (150 mg total) by mouth daily. 30 tablet 11  . cetirizine (ZYRTEC) 10 MG tablet Take 1 tablet (10 mg total) by mouth daily. 30 tablet 3  . elvitegravir-cobicistat-emtricitabine-tenofovir (GENVOYA) 150-150-200-10 MG TABS tablet Take 1 tablet by mouth daily with breakfast. 30 tablet 11  . FLUoxetine (PROZAC) 40 MG capsule Take 2 capsules (80 mg total) by mouth daily. 60 capsule 11  . Multiple Vitamin (MULTIVITAMIN) capsule Take 1 capsule by mouth daily.    . Sodium Chloride-Xylitol (XLEAR SINUS CARE SPRAY NA) Place 1 spray into the nose daily. Reported on 10/31/2015      Musculoskeletal: Strength & Muscle Tone: within normal limits Gait & Station: normal Patient leans: N/A  Psychiatric Specialty Exam: Physical Exam  Constitutional: He is oriented to person, place, and time. He appears well-developed and well-nourished.  HENT:  Head: Normocephalic.  Neck: Normal range of motion.  Respiratory: Effort normal.  Musculoskeletal: Normal range of motion.  Neurological: He is alert and oriented to person, place, and time.  Skin: Skin is warm and dry.  Psychiatric: He has a normal mood and affect. His speech is normal and behavior is normal. Judgment and thought content normal. Cognition and memory are normal.    Review of Systems  Constitutional: Negative.   HENT: Negative.   Eyes: Negative.   Respiratory: Negative.   Cardiovascular: Negative.   Gastrointestinal: Negative.   Genitourinary: Negative.   Musculoskeletal: Negative.   Skin: Negative.   Neurological: Negative.   Endo/Heme/Allergies: Negative.   Psychiatric/Behavioral: Positive for substance abuse. The patient has insomnia.     Blood pressure 124/63, pulse 67, temperature 97.8 F (36.6 C), temperature source Oral, resp. rate 18, SpO2 99 %.There is no weight on file to calculate BMI.  General Appearance: Casual  Eye Contact:  Good  Speech:  Normal Rate  Volume:  Normal   Mood:  Anxious  Affect:  Congruent  Thought Process:  Coherent and Descriptions of Associations: Intact  Orientation:  Full (Time, Place, and Person)  Thought Content:  WDL  Suicidal Thoughts:  No  Homicidal Thoughts:  No  Memory:  Immediate;   Good Recent;   Good Remote;   Good  Judgement:  Fair  Insight:  Fair  Psychomotor Activity:  Normal  Concentration:  Concentration: Good and Attention Span: Good  Recall:  Good  Fund of Knowledge:  Good  Language:  Good  Akathisia:  No  Handed:  Right  AIMS (if indicated):     Assets:  Housing Leisure Time Resilience Social Support  ADL's:  Intact  Cognition:  WNL  Sleep:        Treatment Plan Summary: Daily contact with patient to assess and evaluate symptoms and progress in treatment, Medication management and Plan major depressive disorder, recurrent, moderate:  -Crisis stabilization -Medication management:  Start Seroquel 50 mg at bedtime for paranoia and sleep.  PRN Haldol 5 mg IM and Benadryl 50 mg IM for agitation last night given. -Individual and substance abuse counseling -Rehab resources provided to patient and family  Disposition: No evidence of imminent risk to self or others at present.    Waylan Boga, NP 12/29/2015 11:37 AM Patient seen face-to-face for psychiatric evaluation, chart reviewed and case discussed with the physician extender and developed treatment plan. Reviewed the information documented and agree with the treatment plan. Corena Pilgrim, MD

## 2015-12-29 NOTE — ED Notes (Signed)
Pt discharged ambulatory with mother.  Discharge instructions reviewed and RX reviewed.  All belongings had remained with family.

## 2016-01-01 ENCOUNTER — Encounter (HOSPITAL_COMMUNITY): Payer: Self-pay | Admitting: Emergency Medicine

## 2016-01-01 ENCOUNTER — Emergency Department (HOSPITAL_COMMUNITY)
Admission: EM | Admit: 2016-01-01 | Discharge: 2016-01-01 | Disposition: A | Discharge status: Home / Self Care | Payer: Medicaid Other | Attending: Emergency Medicine | Admitting: Emergency Medicine

## 2016-01-01 ENCOUNTER — Ambulatory Visit: Payer: Self-pay | Admitting: *Deleted

## 2016-01-01 DIAGNOSIS — B20 Human immunodeficiency virus [HIV] disease: Secondary | ICD-10-CM | POA: Insufficient documentation

## 2016-01-01 DIAGNOSIS — F1910 Other psychoactive substance abuse, uncomplicated: Secondary | ICD-10-CM | POA: Insufficient documentation

## 2016-01-01 DIAGNOSIS — R1084 Generalized abdominal pain: Secondary | ICD-10-CM

## 2016-01-01 DIAGNOSIS — Z79899 Other long term (current) drug therapy: Secondary | ICD-10-CM | POA: Insufficient documentation

## 2016-01-01 DIAGNOSIS — F331 Major depressive disorder, recurrent, moderate: Secondary | ICD-10-CM

## 2016-01-01 DIAGNOSIS — F411 Generalized anxiety disorder: Secondary | ICD-10-CM

## 2016-01-01 LAB — URINALYSIS, ROUTINE W REFLEX MICROSCOPIC
Bilirubin Urine: NEGATIVE
Glucose, UA: NEGATIVE mg/dL
Hgb urine dipstick: NEGATIVE
Ketones, ur: NEGATIVE mg/dL
Leukocytes, UA: NEGATIVE
Nitrite: NEGATIVE
Protein, ur: NEGATIVE mg/dL
Specific Gravity, Urine: 1.007 (ref 1.005–1.030)
pH: 7 (ref 5.0–8.0)

## 2016-01-01 LAB — COMPREHENSIVE METABOLIC PANEL
ALT: 36 U/L (ref 17–63)
AST: 30 U/L (ref 15–41)
Albumin: 4.6 g/dL (ref 3.5–5.0)
Alkaline Phosphatase: 86 U/L (ref 38–126)
Anion gap: 10 (ref 5–15)
BUN: 19 mg/dL (ref 6–20)
CO2: 23 mmol/L (ref 22–32)
Calcium: 9 mg/dL (ref 8.9–10.3)
Chloride: 104 mmol/L (ref 101–111)
Creatinine, Ser: 1.09 mg/dL (ref 0.61–1.24)
GFR calc Af Amer: >60 mL/min (ref >60)
GFR calc non Af Amer: >60 mL/min (ref >60)
Glucose, Bld: 95 mg/dL (ref 65–99)
Potassium: 3.9 mmol/L (ref 3.5–5.1)
Sodium: 137 mmol/L (ref 135–145)
Total Bilirubin: 0.4 mg/dL (ref 0.3–1.2)
Total Protein: 8 g/dL (ref 6.5–8.1)

## 2016-01-01 LAB — CBC
HCT: 41.3 % (ref 39.0–52.0)
Hemoglobin: 14.3 g/dL (ref 13.0–17.0)
MCH: 30.6 pg (ref 26.0–34.0)
MCHC: 34.6 g/dL (ref 30.0–36.0)
MCV: 88.4 fL (ref 78.0–100.0)
Platelets: 260 10*3/uL (ref 150–400)
RBC: 4.67 MIL/uL (ref 4.22–5.81)
RDW: 13.1 % (ref 11.5–15.5)
WBC: 8.1 10*3/uL (ref 4.0–10.5)

## 2016-01-01 MED ORDER — POLYETHYLENE GLYCOL 3350 17 GM/SCOOP PO POWD: 17.0000 g | Freq: Every day | ORAL | Status: DC

## 2016-01-01 MED ORDER — ONDANSETRON 4 MG PO TBDP
4.0000 mg | ORAL_TABLET | Freq: Once | ORAL | Status: AC | PRN
  Administered 2016-01-01: 4 mg via ORAL
  Filled 2016-01-01: qty 1

## 2016-01-01 NOTE — Discharge Instructions (Signed)
Please read and follow all provided instructions.  Your diagnoses today include:  1. Generalized abdominal pain   2. Major depressive disorder, recurrent episode, moderate (HCC)     Tests performed today include:  Blood counts and electrolytes  Blood tests to check liver and kidney function  Urine test to look for infection  Vital signs. See below for your results today.   Medications prescribed:   Miralax - laxative  This medication can be found over-the-counter.   Take any prescribed medications only as directed.  Home care instructions:   Follow any educational materials contained in this packet.  Follow-up instructions: Please follow-up with your primary care provider as needed for further evaluation of your symptoms.    Return instructions:  SEEK IMMEDIATE MEDICAL ATTENTION IF:  The pain does not go away or becomes severe   A temperature above 101F develops   Repeated vomiting occurs (multiple episodes)   The pain becomes localized to portions of the abdomen. The right side could possibly be appendicitis. In an adult, the left lower portion of the abdomen could be colitis or diverticulitis.   Blood is being passed in stools or vomit (bright red or black tarry stools)   You develop chest pain, difficulty breathing, dizziness or fainting, or become confused, poorly responsive, or inconsolable (young children)  If you have any other emergent concerns regarding your health  Additional Information: Abdominal (belly) pain can be caused by many things. Your caregiver performed an examination and possibly ordered blood/urine tests and imaging (CT scan, x-rays, ultrasound). Many cases can be observed and treated at home after initial evaluation in the emergency department. Even though you are being discharged home, abdominal pain can be unpredictable. Therefore, you need a repeated exam if your pain does not resolve, returns, or worsens. Most patients with abdominal pain  don't have to be admitted to the hospital or have surgery, but serious problems like appendicitis and gallbladder attacks can start out as nonspecific pain. Many abdominal conditions cannot be diagnosed in one visit, so follow-up evaluations are very important.  Your vital signs today were: BP 132/76 mmHg   Pulse 91   Temp(Src) 98.7 F (37.1 C) (Oral)   Resp 16   SpO2 96% If your blood pressure (bp) was elevated above 135/85 this visit, please have this repeated by your doctor within one month. --------------

## 2016-01-01 NOTE — ED Provider Notes (Signed)
CSN: [id number]     Arrival date & time 01/01/16  1541 History   First MD Initiated Contact with Patient 01/01/16 1834     Chief Complaint  Patient presents with  . Flank Pain  . Back Pain     (Consider location/radiation/quality/duration/timing/severity/associated sxs/prior Treatment) HPI Comments: Patient with history of anxiety and depression, HIV, [facility] -- presents with complaint of generalized abdominal pain, trouble with urination, rectal pain and trouble with defecation. Symptoms have been going on for greater than one week and gradually becoming worse. He has had nausea and one episode of vomiting. No diarrhea. No dysuria or hematuria. No history of abdominal surgeries. No reported fevers. The onset of this condition was acute. The course is constant. Aggravating factors: none. Alleviating factors: none.    Patient is a 35 y.o. male presenting with flank pain and back pain. The history is provided by the patient.  Flank Pain Associated symptoms include abdominal pain and nausea. Pertinent negatives include no chest pain, coughing, fever, headaches, myalgias, rash, sore throat or vomiting.  Back Pain Associated symptoms: abdominal pain   Associated symptoms: no chest pain, no dysuria, no fever and no headaches     Past Medical History  Diagnosis Date  . Anxiety   . Depression   . Substance abuse   . Human immunodeficiency virus (HIV) disease (HCC)    Past Surgical History  Procedure Laterality Date  . Wisdom tooth extraction     Family History  Problem Relation Age of Onset  . Heart disease Father   . Mental illness Maternal Grandmother    Social History  Substance Use Topics  . Smoking status: Never Smoker   . Smokeless tobacco: Never Used  . Alcohol Use: No    Review of Systems  Constitutional: Negative for fever.  HENT: Negative for rhinorrhea and sore throat.   Eyes: Negative for redness.  Respiratory: Negative for cough.    Cardiovascular: Negative for chest pain.  Gastrointestinal: Positive for nausea, abdominal pain and constipation. Negative for vomiting, diarrhea and blood in stool.  Genitourinary: Positive for flank pain and difficulty urinating. Negative for dysuria.  Musculoskeletal: Positive for back pain. Negative for myalgias.  Skin: Negative for rash.  Neurological: Negative for headaches.      Allergies  Review of patient's allergies indicates no known allergies.  Home Medications   Prior to Admission medications   Medication Sig Start Date End Date Taking? Authorizing Provider  buPROPion (WELLBUTRIN XL) 150 MG 24 hr tablet Take 1 tablet (150 mg total) by mouth daily. 12/12/15  Yes Judyann Munsonynthia Snider, MD  cetirizine (ZYRTEC) 10 MG tablet Take 1 tablet (10 mg total) by mouth daily. 06/28/15  Yes Judyann Munsonynthia Snider, MD  elvitegravir-cobicistat-emtricitabine-tenofovir (GENVOYA) 150-150-200-10 MG TABS tablet Take 1 tablet by mouth daily with breakfast. 09/19/15  Yes Judyann Munsonynthia Snider, MD  FLUoxetine (PROZAC) 40 MG capsule Take 2 capsules (80 mg total) by mouth daily. 11/17/15  Yes Thermon LeylandLaura A Davis, NP  Multiple Vitamin (MULTIVITAMIN) capsule Take 1 capsule by mouth daily. 11/17/15  Yes Thermon LeylandLaura A Davis, NP  QUEtiapine (SEROQUEL) 50 MG tablet Take 1 tablet (50 mg total) by mouth at bedtime. 12/29/15  Yes Charm RingsJamison Y Lord, NP  Sodium Chloride-Xylitol (XLEAR SINUS CARE SPRAY NA) Place 1 spray into the nose daily as needed (CONGESTION). Reported on 10/31/2015   Yes Historical Provider, MD  polyethylene glycol powder (GLYCOLAX/MIRALAX) powder Take 17 g by mouth daily. 01/01/16   Renne CriglerJoshua Everlynn Sagun, PA-C   BP  132/76 mmHg  Pulse 91  Temp(Src) 98.7 F (37.1 C) (Oral)  Resp 16  SpO2 96%   Physical Exam  Constitutional: He appears well-developed and well-nourished.  HENT:  Head: Normocephalic and atraumatic.  Eyes: Conjunctivae are normal. Right eye exhibits no discharge. Left eye exhibits no discharge.  Neck: Normal range of  motion. Neck supple.  Cardiovascular: Normal rate, regular rhythm and normal heart sounds.   Pulmonary/Chest: Effort normal and breath sounds normal.  Abdominal: Soft. He exhibits no distension. There is tenderness. There is no rebound and no guarding.  Patient with tenderness of abdomen over entire abdomen with even the lightest touch. Patient will then sit up and move easily without any distress.  Genitourinary: Rectal exam shows no external hemorrhoid, no fissure, no mass and no tenderness.  Brown stool  Neurological: He is alert.  Skin: Skin is warm and dry.  Psychiatric: He has a normal mood and affect.  Nursing note and vitals reviewed.   ED Course  Procedures (including critical care time) Labs Review Labs Reviewed  COMPREHENSIVE METABOLIC PANEL  CBC  URINALYSIS, ROUTINE W REFLEX MICROSCOPIC (NOT AT [facility])   7:07 PM Patient seen and examined. External rectal exam performed with nurse chaperone.   Vital signs reviewed and are as follows: BP 132/76 mmHg  Pulse 91  Temp(Src) 98.7 F (37.1 C) (Oral)  Resp 16  SpO2 96%  Will start on miralax. The patient was urged to return to the Emergency Department immediately with worsening of current symptoms, worsening abdominal pain, persistent vomiting, blood noted in stools, fever, or any other concerns. The patient verbalized understanding.    MDM   Final diagnoses:  Generalized abdominal pain  Major depressive disorder, recurrent episode, moderate (HCC)   Patient with abdominal pain. Vitals are stable, no fever. Labs are normal. Imaging not indicated. Suspect psych component of these symptoms.  No signs of dehydration, patient is tolerating PO's. Lungs are clear and no signs suggestive of PNA. Low concern for appendicitis, cholecystitis, pancreatitis, ruptured viscus, UTI, kidney stone, aortic dissection, aortic aneurysm or other emergent abdominal etiology. Supportive therapy indicated with return if symptoms worsen.      Renne CriglerJoshua [employer], PA-C 01/01/16 1909  Raeford RazorStephen Kohut, MD 01/02/16 1039

## 2016-01-01 NOTE — ED Notes (Signed)
Pt c/o sharp right lower back pain and right lower abdominal pain. Pt also reports right leg pain. Pt states he has trouble urinating and painful bowel movements. Hx hemorrhoids.

## 2016-01-01 NOTE — BH Specialist Note (Signed)
Jimmy HaJonathan was present for his scheduled appointment with counselor today.  Patient was accompanied by his mother.  Patient was acting out of it as demonstrated by strange awkward movements, racing thoughts, getting up out of his seat and walking to the corner of the office, making strange noses etc.  Both counselor and patient's mother agreed that he should go to the emergency room for further evaluation. Counselor provided support and encouragement accordingly.  Counselor gave patient and his mother a list of rehabilitation facilities that would accommodate no insurance. [name]. Counselor made the referral and assisted patient to make the appointment for the assessment. The appointment was made for tomorrow mooring at 8[name].  Counselor reviewed with patients the list of things to bring and not bring.  Counselor provided patient adequate time for him and his mother to voice any concerns.  Patient's mother indicated that would call and check back in with her in a few days.  Jenel LucksJodi Reginaldo Hazard, MA, LPC [facility]

## 2016-01-02 ENCOUNTER — Emergency Department (HOSPITAL_COMMUNITY)
Admission: EM | Admit: 2016-01-02 | Discharge: 2016-01-03 | Disposition: A | Discharge status: Home / Self Care | Payer: Self-pay | Attending: Emergency Medicine | Admitting: Emergency Medicine

## 2016-01-02 ENCOUNTER — Encounter (HOSPITAL_COMMUNITY): Payer: Self-pay

## 2016-01-02 DIAGNOSIS — F418 Other specified anxiety disorders: Secondary | ICD-10-CM | POA: Insufficient documentation

## 2016-01-02 DIAGNOSIS — R440 Auditory hallucinations: Secondary | ICD-10-CM | POA: Insufficient documentation

## 2016-01-02 DIAGNOSIS — Z79899 Other long term (current) drug therapy: Secondary | ICD-10-CM | POA: Insufficient documentation

## 2016-01-02 DIAGNOSIS — Z7982 Long term (current) use of aspirin: Secondary | ICD-10-CM | POA: Insufficient documentation

## 2016-01-02 DIAGNOSIS — F22 Delusional disorders: Secondary | ICD-10-CM

## 2016-01-02 DIAGNOSIS — F419 Anxiety disorder, unspecified: Secondary | ICD-10-CM

## 2016-01-02 LAB — RAPID URINE DRUG SCREEN, HOSP PERFORMED
AMPHETAMINES: NOT DETECTED
BENZODIAZEPINES: NOT DETECTED
Barbiturates: NOT DETECTED
COCAINE: NOT DETECTED
Opiates: NOT DETECTED
Tetrahydrocannabinol: NOT DETECTED

## 2016-01-02 LAB — COMPREHENSIVE METABOLIC PANEL
ALBUMIN: 4.5 g/dL (ref 3.5–5.0)
ALK PHOS: 94 U/L (ref 38–126)
ALT: 38 U/L (ref 17–63)
ANION GAP: 8 (ref 5–15)
AST: 30 U/L (ref 15–41)
BUN: 14 mg/dL (ref 6–20)
CO2: 22 mmol/L (ref 22–32)
Calcium: 9.4 mg/dL (ref 8.9–10.3)
Chloride: 106 mmol/L (ref 101–111)
Creatinine, Ser: 0.92 mg/dL (ref 0.61–1.24)
GFR calc Af Amer: >60 mL/min (ref >60)
GFR calc non Af Amer: >60 mL/min (ref >60)
GLUCOSE: 91 mg/dL (ref 65–99)
POTASSIUM: 3.9 mmol/L (ref 3.5–5.1)
SODIUM: 136 mmol/L (ref 135–145)
Total Bilirubin: 0.5 mg/dL (ref 0.3–1.2)
Total Protein: 8 g/dL (ref 6.5–8.1)

## 2016-01-02 LAB — SALICYLATE LEVEL: Salicylate Lvl: <4 mg/dL (ref 2.8–30.0)

## 2016-01-02 LAB — ETHANOL: Alcohol, Ethyl (B): <5 mg/dL (ref <5)

## 2016-01-02 LAB — CBC
HEMATOCRIT: 42.2 % (ref 39.0–52.0)
HEMOGLOBIN: 14.2 g/dL (ref 13.0–17.0)
MCH: 29.8 pg (ref 26.0–34.0)
MCHC: 33.6 g/dL (ref 30.0–36.0)
MCV: 88.7 fL (ref 78.0–100.0)
Platelets: 265 10*3/uL (ref 150–400)
RBC: 4.76 MIL/uL (ref 4.22–5.81)
RDW: 13.1 % (ref 11.5–15.5)
WBC: 8.8 10*3/uL (ref 4.0–10.5)

## 2016-01-02 LAB — ACETAMINOPHEN LEVEL

## 2016-01-02 MED ORDER — DIPHENHYDRAMINE HCL 50 MG/ML IJ SOLN
12.5000 mg | Freq: Once | INTRAMUSCULAR | Status: AC
  Administered 2016-01-02: 12.5 mg via INTRAVENOUS
  Filled 2016-01-02: qty 1

## 2016-01-02 MED ORDER — LORATADINE 10 MG PO TABS
10.0000 mg | ORAL_TABLET | Freq: Every day | ORAL | Status: DC
  Administered 2016-01-02: 10 mg via ORAL
  Filled 2016-01-02: qty 1

## 2016-01-02 MED ORDER — POLYETHYLENE GLYCOL 3350 17 G PO PACK
17.0000 g | PACK | Freq: Every day | ORAL | Status: DC
  Filled 2016-01-02: qty 1

## 2016-01-02 MED ORDER — POLYETHYLENE GLYCOL 3350 17 GM/SCOOP PO POWD: 17.0000 g | Freq: Every day | ORAL | Status: DC

## 2016-01-02 MED ORDER — ADULT MULTIVITAMIN W/MINERALS CH: 1.0000 | ORAL_TABLET | Freq: Every day | ORAL | Status: DC

## 2016-01-02 MED ORDER — ELVITEG-COBIC-EMTRICIT-TENOFAF 150-150-200-10 MG PO TABS
1.0000 | ORAL_TABLET | Freq: Every day | ORAL | Status: DC
  Filled 2016-01-02: qty 1

## 2016-01-02 MED ORDER — MULTIVITAMINS PO CAPS
1.0000 | ORAL_CAPSULE | Freq: Every day | ORAL | Status: DC
Start: 2016-01-02 — End: 2016-01-02

## 2016-01-02 MED ORDER — ASPIRIN 325 MG PO TABS: 325.0000 mg | ORAL_TABLET | Freq: Four times a day (QID) | ORAL | Status: DC | PRN

## 2016-01-02 MED ORDER — METOCLOPRAMIDE HCL 5 MG/ML IJ SOLN
10.0000 mg | Freq: Once | INTRAMUSCULAR | Status: AC
  Administered 2016-01-02: 10 mg via INTRAVENOUS
  Filled 2016-01-02: qty 2

## 2016-01-02 MED ORDER — FLUOXETINE HCL 40 MG PO CAPS: 80.0000 mg | ORAL_CAPSULE | Freq: Every day | ORAL | Status: DC

## 2016-01-02 MED ORDER — SODIUM CHLORIDE 0.9 % IV BOLUS (SEPSIS)
1000.0000 mL | Freq: Once | INTRAVENOUS | Status: AC
Start: 2016-01-02 — End: 2016-01-02
  Administered 2016-01-02: 1000 mL via INTRAVENOUS

## 2016-01-02 MED ORDER — BUPROPION HCL ER (XL) 150 MG PO TB24
150.0000 mg | ORAL_TABLET | Freq: Every day | ORAL | Status: DC
  Administered 2016-01-02: 150 mg via ORAL
  Filled 2016-01-02: qty 1

## 2016-01-02 MED ORDER — KETOROLAC TROMETHAMINE 30 MG/ML IJ SOLN
30.0000 mg | Freq: Once | INTRAMUSCULAR | Status: AC
  Administered 2016-01-02: 30 mg via INTRAVENOUS
  Filled 2016-01-02: qty 1

## 2016-01-02 MED ORDER — FLUOXETINE HCL 20 MG PO CAPS: 80.0000 mg | ORAL_CAPSULE | Freq: Every day | ORAL | Status: DC
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
## 2016-01-02 NOTE — ED Provider Notes (Signed)
35yo male with history of HIV, anxiety, depression presents with concern for headache, anxiety, depression, paranoia and hallucinations. Pt previously medically cleared wiwth resolution of HA and TTS was consulted.   TTS evaluated patient and feel he is appropriate for close outpatient follow up with counselor Eben BurowJody Herring.  Patient evaluated by me and denies ongoing SI/HI/hallucinations, contracts for safety and will follow up with PCP and counselor.   Alvira MondayErin Sausha Raymond, MD 01/04/16 [phone]

## 2016-01-02 NOTE — ED Notes (Signed)
Pt wanded by security. 

## 2016-01-02 NOTE — ED Notes (Signed)
Security notified to wand pt 

## 2016-01-02 NOTE — ED Notes (Signed)
Pt is on with TTS 

## 2016-01-02 NOTE — ED Notes (Signed)
PA at bedside.
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
## 2016-01-03 LAB — GC/CHLAMYDIA PROBE AMP (~~LOC~~) NOT AT ARMC
Chlamydia: NEGATIVE
Neisseria Gonorrhea: NEGATIVE

## 2016-01-03 LAB — RPR: RPR Ser Ql: NONREACTIVE

## 2016-01-08 ENCOUNTER — Encounter: Payer: Self-pay | Admitting: Internal Medicine

## 2016-01-08 ENCOUNTER — Ambulatory Visit (INDEPENDENT_AMBULATORY_CARE_PROVIDER_SITE_OTHER): Payer: Self-pay | Admitting: Internal Medicine

## 2016-01-08 VITALS — BP 142/86 | HR 84 | Temp 98.2°F | Wt 228.2 lb

## 2016-01-08 DIAGNOSIS — F32A Depression, unspecified: Secondary | ICD-10-CM

## 2016-01-08 DIAGNOSIS — B20 Human immunodeficiency virus [HIV] disease: Secondary | ICD-10-CM

## 2016-01-08 DIAGNOSIS — F411 Generalized anxiety disorder: Secondary | ICD-10-CM

## 2016-01-08 DIAGNOSIS — Z21 Asymptomatic human immunodeficiency virus [HIV] infection status: Secondary | ICD-10-CM

## 2016-01-08 DIAGNOSIS — F1910 Other psychoactive substance abuse, uncomplicated: Secondary | ICD-10-CM

## 2016-01-08 DIAGNOSIS — F329 Major depressive disorder, single episode, unspecified: Secondary | ICD-10-CM
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
## 2016-01-25 ENCOUNTER — Encounter: Payer: Self-pay | Admitting: Internal Medicine

## 2016-02-21 IMAGING — CR DG CHEST 2V
2 series · 2 of 2 positions shown · non-contrast
Comparison: 07/19/2013

CLINICAL DATA: Difficulty breathing and swallowing secondary to
congestion for the last month, worsening tonight. Unable to swallow.
He has previously discussed symptoms with primary care physician.
History of anxiety, depression, substance abuse, and HIV.

EXAM:
CHEST  2 VIEW

[w chest pa]
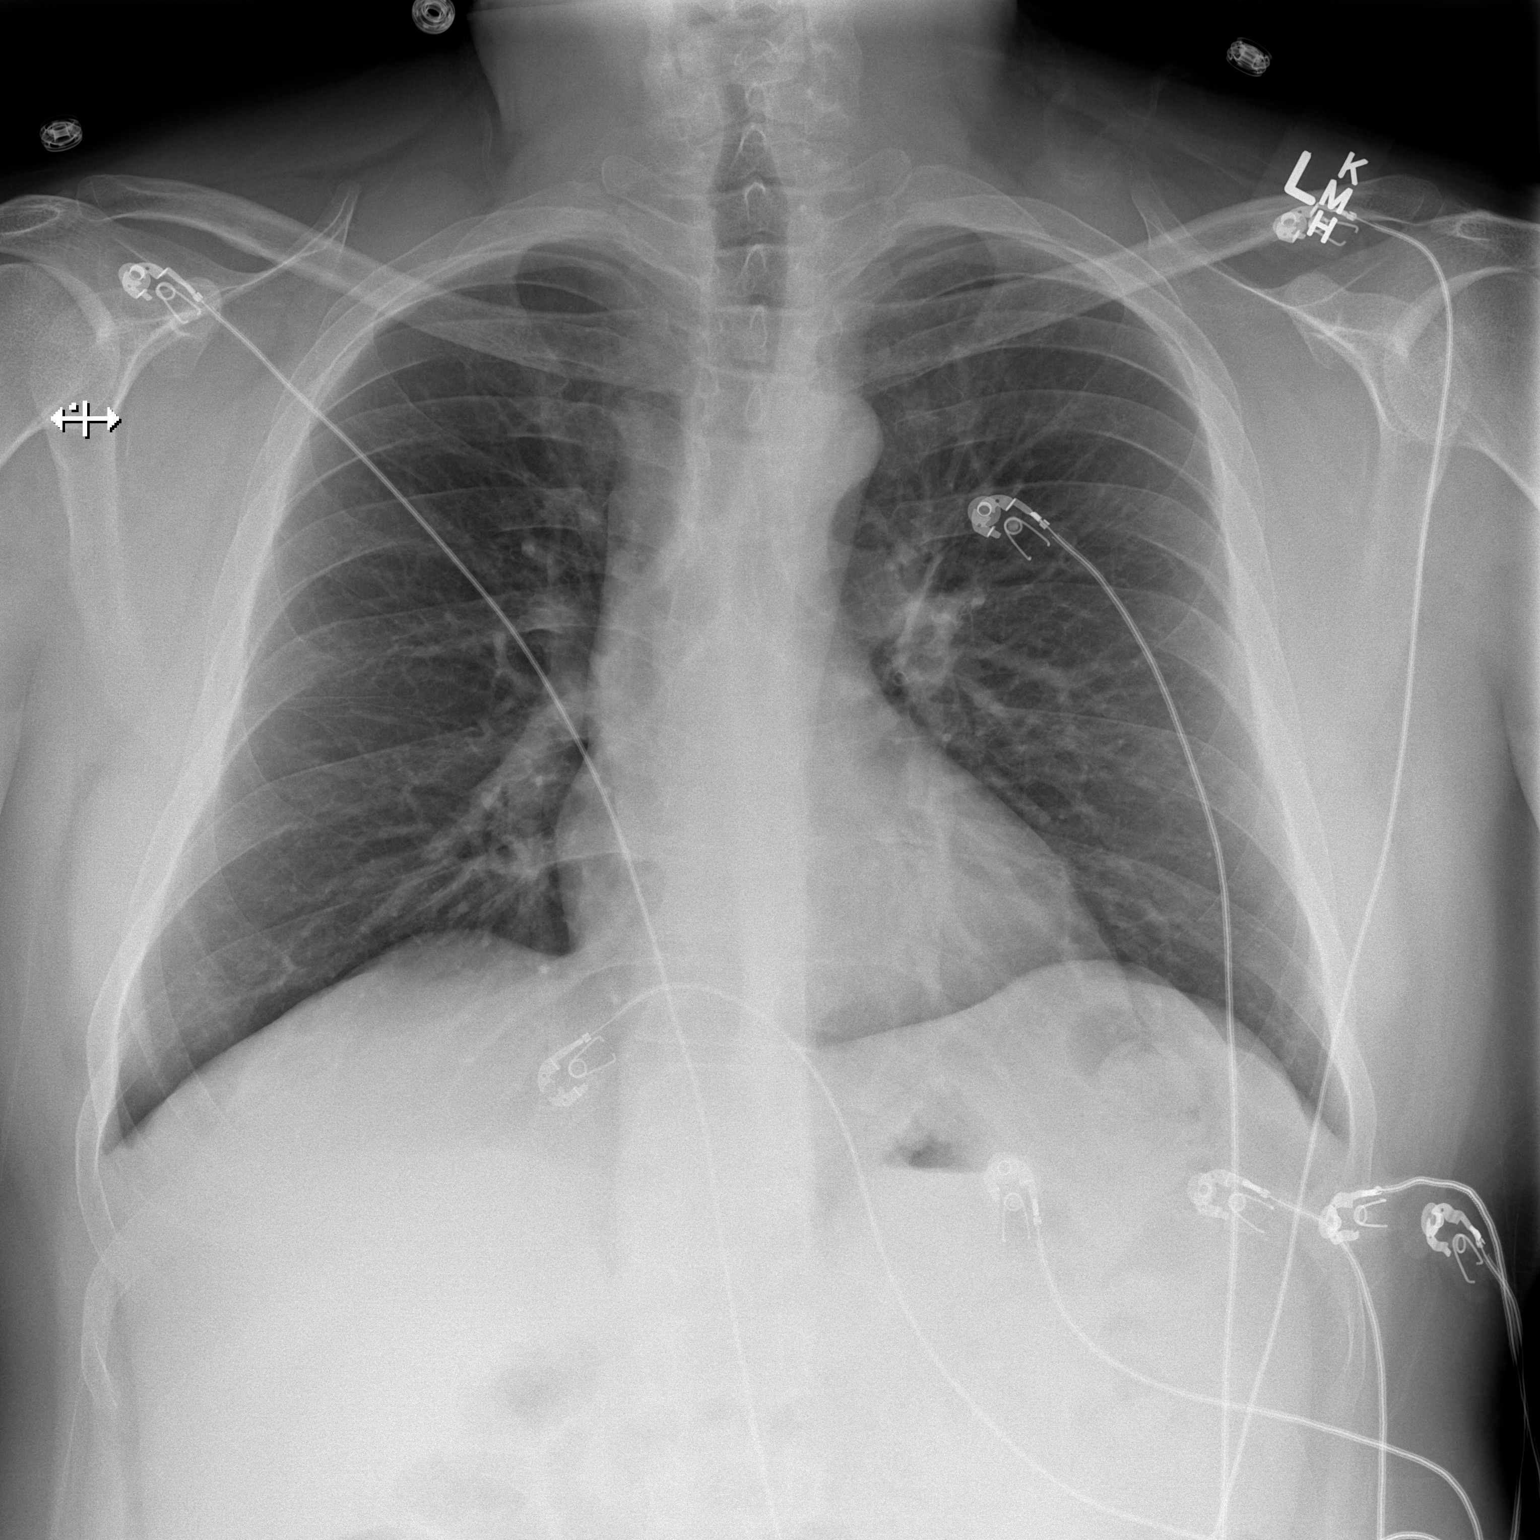

[w chest lat]
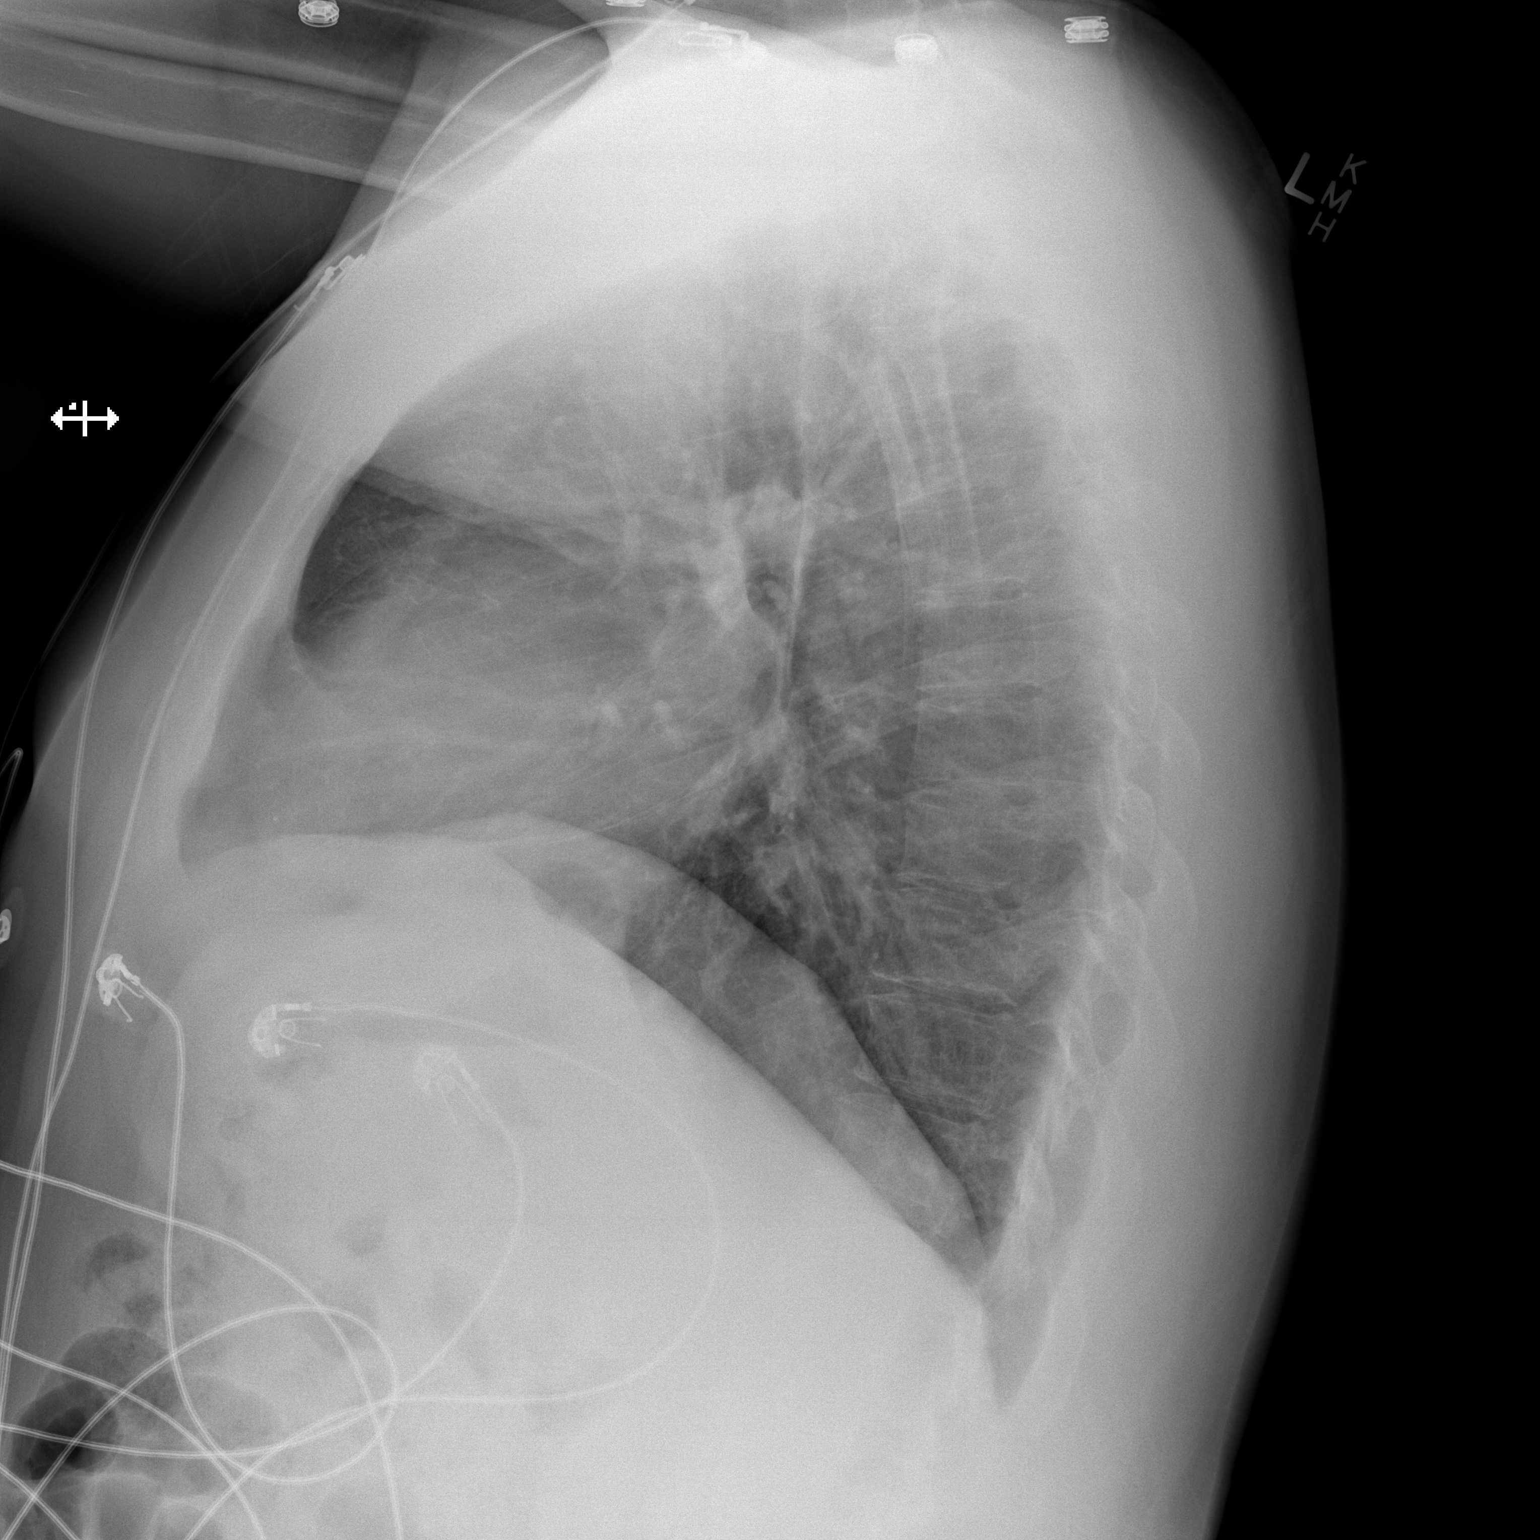

[2 of 2 positions shown; findings below may reference images not displayed]

FINDINGS: The heart size and mediastinal contours are within normal limits.
Both lungs are clear. The visualized skeletal structures are
unremarkable.
IMPRESSION: No active cardiopulmonary disease.

## 2016-03-26 ENCOUNTER — Ambulatory Visit: Payer: Medicaid Other | Admitting: *Deleted

## 2016-03-26 DIAGNOSIS — F411 Generalized anxiety disorder: Secondary | ICD-10-CM

## 2016-03-27 NOTE — BH Specialist Note (Signed)
Jimmy Davis showed for his scheduled appointment today.  Patient was oriented times four with a nervous and restless demeanor.  This was evidenced by patient biting nails, bouncing his leg, and restlessness.  Patient was alert and was talkative. Counselor inquired with patient as to how things were going.  Patient shared that he is doing pretty good,  Patient indicated that he was still attending NA meetings almost daily, has two classes that he is taking at the local community college.  Patient stated that he almost talked himself out of going to school again for fear he would probably fail and or would not be able to make decent grades and he would be wasting his time and money.  Counselor educated patient about ANTS and acronym for automatic negative thoughts.  Counselor explained that once one thought pops in your head and several more will follow.  Counselor assisted patient in figuring out ways to shoosh the first thought and subsequently all the others.  Counselor had patient practice replacing his negative thoughts with complimentary and reassuring ones. Patient was very cooperative and demonstrated that he was working hard to practice the skills.  Counselor provided support and encouragement for patient today. Patient acknowledged that he knew he had come a long way and has been 96 days clean.  Counselor gave him props and encouraged him to keep up the good work.   Jenel LucksJodi Mychael Smock, MA, LPC [facility]

## 2016-04-09 ENCOUNTER — Ambulatory Visit: Payer: Medicaid Other | Admitting: *Deleted

## 2016-04-09 DIAGNOSIS — F1910 Other psychoactive substance abuse, uncomplicated: Secondary | ICD-10-CM

## 2016-04-09 DIAGNOSIS — F606 Avoidant personality disorder: Secondary | ICD-10-CM

## 2016-04-09 NOTE — BH Specialist Note (Signed)
Jimmy Davis showed for his scheduled appointment today.  Patient was oriented times four with good affect and dress.  Patient was alert and talkative.  Counselor inquired with patient as to how things were going.  Patient communicated that things were going ok and that he just did not want to do anything to mess it up.  Patient said that he for the most part lately feels ok but then will hit a very down time and feel overwhelmed by the "vastness" of life.  Counselor educated patient about the difference between motivation and follow through. Counselor shared with patient tips for maintaining goals and following through with them. Counselor educated patient about self sabotage and how it can reek havoc in our lives and the decisions we make. Patient and counselor discussed balancing goals with specific expectations and building further on that.  Patient was receptive and cooperative today. Patient made another appointment for two weeks out.  Jenel LucksJodi Mallorie Norrod, MA,LPC [facility]

## 2016-04-18 ENCOUNTER — Ambulatory Visit (INDEPENDENT_AMBULATORY_CARE_PROVIDER_SITE_OTHER): Payer: Self-pay | Admitting: Internal Medicine

## 2016-04-18 ENCOUNTER — Encounter: Payer: Self-pay | Admitting: Internal Medicine

## 2016-04-18 VITALS — BP 128/73 | HR 72 | Temp 97.6°F | Wt 233.0 lb

## 2016-04-18 DIAGNOSIS — Z113 Encounter for screening for infections with a predominantly sexual mode of transmission: Secondary | ICD-10-CM

## 2016-04-18 DIAGNOSIS — F1590 Other stimulant use, unspecified, uncomplicated: Secondary | ICD-10-CM

## 2016-04-18 DIAGNOSIS — Z23 Encounter for immunization: Secondary | ICD-10-CM

## 2016-04-18 DIAGNOSIS — B20 Human immunodeficiency virus [HIV] disease: Secondary | ICD-10-CM

## 2016-04-18 LAB — COMPLETE METABOLIC PANEL WITH GFR
ALT: 25 U/L (ref 9–46)
AST: 19 U/L (ref 10–40)
Albumin: 4.5 g/dL (ref 3.6–5.1)
Alkaline Phosphatase: 90 U/L (ref 40–115)
BUN: 12 mg/dL (ref 7–25)
CALCIUM: 9.4 mg/dL (ref 8.6–10.3)
CHLORIDE: 102 mmol/L (ref 98–110)
CO2: 24 mmol/L (ref 20–31)
Creat: 1.01 mg/dL (ref 0.60–1.35)
Glucose, Bld: 81 mg/dL (ref 65–99)
POTASSIUM: 4.3 mmol/L (ref 3.5–5.3)
Sodium: 139 mmol/L (ref 135–146)
Total Bilirubin: 0.5 mg/dL (ref 0.2–1.2)
Total Protein: 7.6 g/dL (ref 6.1–8.1)

## 2016-04-18 LAB — CBC WITH DIFFERENTIAL/PLATELET
BASOS ABS: 0 {cells}/uL (ref 0–200)
Basophils Relative: 0 %
Eosinophils Absolute: 153 cells/uL (ref 15–500)
Eosinophils Relative: 3 %
HEMATOCRIT: 44.6 % (ref 38.5–50.0)
Hemoglobin: 15.3 g/dL (ref 13.2–17.1)
LYMPHS PCT: 40 %
Lymphs Abs: 2040 cells/uL (ref 850–3900)
MCH: 30.8 pg (ref 27.0–33.0)
MCHC: 34.3 g/dL (ref 32.0–36.0)
MCV: 89.7 fL (ref 80.0–100.0)
MONO ABS: 408 {cells}/uL (ref 200–950)
MPV: 8.8 fL (ref 7.5–12.5)
Monocytes Relative: 8 %
NEUTROS PCT: 49 %
Neutro Abs: 2499 cells/uL (ref 1500–7800)
Platelets: 214 10*3/uL (ref 140–400)
RBC: 4.97 MIL/uL (ref 4.20–5.80)
RDW: 13.8 % (ref 11.0–15.0)
WBC: 5.1 10*3/uL (ref 3.8–10.8)

## 2016-04-18 NOTE — Progress Notes (Signed)
RFV: follow up for hiv disease Patient ID: Jimmy Davis, male   DOB: Aug 19, 1980, 35 y.o.   MRN: [id number]  HPI 35yo M, hiv disease, CD 4 count of 730/VL<20 on genvoya. He reports good adherence with his hiv medications. Quit using meth and has been sober for 4 months and continues to attend  AA meetings daily. 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 Mom supportive of his sobriety.  Outpatient Encounter Prescriptions as of 04/18/2016  Medication Sig  . aspirin 325 MG tablet Take 325 mg by mouth every 6 (six) hours as needed for mild pain.  . cetirizine (ZYRTEC) 10 MG tablet Take 1 tablet (10 mg total) by mouth daily.  Marland Kitchen. elvitegravir-cobicistat-emtricitabine-tenofovir (GENVOYA) 150-150-200-10 MG TABS tablet Take 1 tablet by mouth daily with breakfast.  . FLUoxetine (PROZAC) 40 MG capsule Take 2 capsules (80 mg total) by mouth daily.  . Multiple Vitamin (MULTIVITAMIN) capsule Take 1 capsule by mouth daily.  . polyethylene glycol powder (GLYCOLAX/MIRALAX) powder Take 17 g by mouth daily.  . Sodium Chloride-Xylitol (XLEAR SINUS CARE SPRAY NA) Place 1 spray into the nose daily as needed (CONGESTION). Reported on 10/31/2015  . ziprasidone (GEODON) 20 MG capsule Take 20 mg by mouth 2 (two) times daily with a meal.   No facility-administered encounter medications on file as of 04/18/2016.      Patient Active Problem List   Diagnosis Date Noted  . Major depressive disorder, recurrent episode, moderate (HCC) 11/17/2015  . 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 Cellulitis of left lower extremity 12/07/2014  . Sepsis (HCC) 12/07/2014  . Cellulitis 12/07/2014  . Substance abuse 10/03/2014  . Depression 10/03/2014  . Chronic diarrhea 07/27/2013  . Human immunodeficiency virus (HIV) disease (HCC) 06/29/2013     Health Maintenance Due  Topic Date Due  . TETANUS/TDAP  07/02/1998  . INFLUENZA VACCINE  02/06/2016     Review of Systems 10 point ros is negative Physical Exam   BP 128/73   Pulse  72   Temp 97.6 F (36.4 C) (Oral)   Wt 105.7 kg (233 lb)   BMI 33.43 kg/m  Physical Exam  Constitutional: He is oriented to person, place, and time. He appears well-developed and well-nourished. No distress.  HENT:  Mouth/Throat: Oropharynx is clear and moist. No oropharyngeal exudate.  Cardiovascular: Normal rate, regular rhythm and normal heart sounds. Exam reveals no gallop and no friction rub.  No murmur heard.  Pulmonary/Chest: Effort normal and breath sounds normal. No respiratory distress. He has no wheezes.  Abdominal: Soft. Bowel sounds are normal. He exhibits no distension. There is no tenderness.  Lymphadenopathy:  He has no cervical adenopathy.  Neurological: He is alert and oriented to person, place, and time.  Skin: Skin is warm and dry. No rash noted. No erythema.  Psychiatric: He has a normal mood and affect. His behavior is normal.    Lab Results  Component Value Date   CD4TCELL 25 (L) 09/19/2015   Lab Results  Component Value Date   CD4TABS 730 09/19/2015   CD4TABS 850 03/30/2015   CD4TABS 750 12/27/2014   Lab Results  Component Value Date   HIV1RNAQUANT <20 09/19/2015   Lab Results  Component Value Date   HEPBSAB NEG 08/17/2013   No results found for: RPR  CBC Lab Results  Component Value Date   WBC 8.8 01/02/2016   RBC 4.76 01/02/2016   HGB 14.2 01/02/2016   HCT 42.2 01/02/2016   PLT 265 01/02/2016   MCV 88.7 01/02/2016  MCH 29.8 01/02/2016   MCHC 33.6 01/02/2016   RDW 13.1 01/02/2016   LYMPHSABS 3.1 09/19/2015   MONOABS 0.6 09/19/2015   EOSABS 0.1 09/19/2015   BASOSABS 0.0 09/19/2015   BMET Lab Results  Component Value Date   NA 136 01/02/2016   K 3.9 01/02/2016   CL 106 01/02/2016   CO2 22 01/02/2016   GLUCOSE 91 01/02/2016   BUN 14 01/02/2016   CREATININE 0.92 01/02/2016   CALCIUM 9.4 01/02/2016   GFRNONAA >60 01/02/2016   GFRAA >60 01/02/2016     Assessment and Plan  hiv disease = plan on continue with genvoya, well  controlled  Health maintenance =will give him flu vaccine today  Meth use = currently has been able to be sober for 4 months which I applauded his dedication to continue to be involved with aa meetings. This is a first in the last year.  rtc in 3 months, will give meningitis at next visit

## 2016-04-19 LAB — T-HELPER CELL (CD4) - (RCID CLINIC ONLY)
CD4 % Helper T Cell: 25 % — ABNORMAL LOW (ref 33–55)
CD4 T Cell Abs: 520 /uL (ref 400–2700)

## 2016-04-19 LAB — URINE CYTOLOGY ANCILLARY ONLY
Chlamydia: NEGATIVE
NEISSERIA GONORRHEA: NEGATIVE

## 2016-04-19 LAB — RPR

## 2016-04-22 LAB — HIV-1 RNA QUANT-NO REFLEX-BLD
HIV 1 RNA Quant: <20 copies/mL (ref <20)
HIV-1 RNA Quant, Log: <1.3 Log copies/mL (ref <1.30)

## 2016-04-23 ENCOUNTER — Ambulatory Visit: Payer: Medicaid Other | Admitting: *Deleted

## 2016-04-23 DIAGNOSIS — F320 Major depressive disorder, single episode, mild: Secondary | ICD-10-CM

## 2016-04-23 DIAGNOSIS — F1910 Other psychoactive substance abuse, uncomplicated: Secondary | ICD-10-CM

## 2016-04-23 NOTE — BH Specialist Note (Signed)
Jimmy Davis was present today for his scheduled appointment with counselor.  Patient was in good spirits and good frame of mind.  Patient was oriented times four, well dressed, and talkative.  Patient shared that things were going well and was feeling like he was making some headway on his life in a positive way.  Patient did say that he had been a little slack getting to his AA meetings but had not stopped talking to his sponsor everyday, and accepting a phone call from a fellow AA member to check on him.  Counselor educated patient about reactivating his life in order to avoid sliding back into the depressive place which also contributes to his substance abuse. Counselor discussed with patient step by step ways of how to use certain skills such as: recognizing and being mindful of how inactivity feeds depression, insisting on thinking realistically, and being self aware of four main areas in which depressed people reduce their activity...involvement w/family and friends, personally rewarding activities, self care, and small responsibilities/obligations.  Patient wrote out goals and ideas as he engaged fully during session.  Patient was cooperative and very interested in working on himself.  Patient responded well and was even observed laughing a couple of times.  Another appointment was made for two weeks out.   Jimmy LucksJodi Gaither Biehn, MA, LPC [facility]

## 2016-05-07 ENCOUNTER — Ambulatory Visit: Payer: Medicaid Other | Admitting: *Deleted

## 2016-05-07 DIAGNOSIS — F1510 Other stimulant abuse, uncomplicated: Secondary | ICD-10-CM

## 2016-05-07 NOTE — BH Specialist Note (Signed)
Jimmy HaJonathan was present for his scheduled appointment today.  Patient was oriented times

## 2016-06-04 ENCOUNTER — Ambulatory Visit: Payer: Medicaid Other | Admitting: *Deleted

## 2016-06-04 DIAGNOSIS — F1590 Other stimulant use, unspecified, uncomplicated: Secondary | ICD-10-CM

## 2016-06-04 NOTE — Progress Notes (Signed)
Jimmy Davis was present for his scheduled appointment today.  Patient was oriented times four with good affect and dress.  Patient was alert but not very talkative today.  Patient shared that he was feeling very tempted lately and was afraid of relapsing.  Patient stated that he was bored and was feeling unmotivated again.  Counselor asked patient if he had started the walk program and he said no.  Counselor inquired with patient if he had attended any of the social meet up groups to which patient also said no.  Counselor educated patient about remaining stagnant and taking no initiative towards his goals. Patient had no reason as to why he was not following through lately.  Counselor educated patient about holidays and how they can become triggers.  Patient agreed and said he knows he has been slack lately.  Counselor provided support, encouragement and accountability for patient. Patient made another appointment for two weeks out.   Jenel LucksJodi Lun Muro, MA, LPC [facility]

## 2016-06-18 ENCOUNTER — Ambulatory Visit: Payer: Self-pay | Admitting: *Deleted

## 2016-07-18 ENCOUNTER — Encounter: Payer: Self-pay | Admitting: Internal Medicine

## 2016-07-18 ENCOUNTER — Ambulatory Visit (INDEPENDENT_AMBULATORY_CARE_PROVIDER_SITE_OTHER): Payer: Self-pay | Admitting: Internal Medicine

## 2016-07-18 VITALS — BP 128/81 | HR 77 | Temp 98.3°F | Ht 69.0 in | Wt 237.0 lb

## 2016-07-18 DIAGNOSIS — F1910 Other psychoactive substance abuse, uncomplicated: Secondary | ICD-10-CM

## 2016-07-18 DIAGNOSIS — F324 Major depressive disorder, single episode, in partial remission: Secondary | ICD-10-CM

## 2016-07-18 DIAGNOSIS — B20 Human immunodeficiency virus [HIV] disease: Secondary | ICD-10-CM

## 2016-07-18 DIAGNOSIS — Z23 Encounter for immunization: Secondary | ICD-10-CM

## 2016-07-18 NOTE — Progress Notes (Signed)
Patient ID: Jimmy Davis, male   DOB: February 25, 1981, 36 y.o.   MRN: [id number]  HPI 36yo M with hiv disease, depression/anxiety with hx of substance abuse. Went to counseling/treatment recently.  Cd 4 count f o520/VL <20 in oct. He said htat he relapsed once over the hoidays after having 6 month sobriety. He feels it was related to feeling depressed. He continues to seek mental health counseling and started wellbutrin. He goes weekly for counseling. He is also attending aa meetings.  Outpatient Encounter Prescriptions as of 07/18/2016  Medication Sig  . aspirin 325 MG tablet Take 325 mg by mouth every 6 (six) hours as needed for mild pain.  Marland Kitchen buPROPion (WELLBUTRIN SR) 150 MG 12 hr tablet TK 1 T PO D FOR 1 WEEK. INCREASE TO 2 XD  . cetirizine (ZYRTEC) 10 MG tablet Take 1 tablet (10 mg total) by mouth daily.  Marland Kitchen elvitegravir-cobicistat-emtricitabine-tenofovir (GENVOYA) 150-150-200-10 MG TABS tablet Take 1 tablet by mouth daily with breakfast.  . FLUoxetine (PROZAC) 40 MG capsule Take 2 capsules (80 mg total) by mouth daily.  . Multiple Vitamin (MULTIVITAMIN) capsule Take 1 capsule by mouth daily.  . ziprasidone (GEODON) 20 MG capsule Take 20 mg by mouth 2 (two) times daily with a meal.  . polyethylene glycol powder (GLYCOLAX/MIRALAX) powder Take 17 g by mouth daily. (Patient not taking: Reported on 07/18/2016)  . Sodium Chloride-Xylitol (XLEAR SINUS CARE SPRAY NA) Place 1 spray into the nose daily as needed (CONGESTION). Reported on 10/31/2015   No facility-administered encounter medications on file as of 07/18/2016.      Patient Active Problem List   Diagnosis Date Noted  . Major depressive disorder, recurrent episode, moderate (HCC) 11/17/2015  . 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 Cellulitis of left lower extremity 12/07/2014  . Sepsis (HCC) 12/07/2014  . Cellulitis 12/07/2014  . Substance abuse 10/03/2014  . Depression 10/03/2014  . Chronic diarrhea 07/27/2013  . Human  immunodeficiency virus (HIV) disease (HCC) 06/29/2013     Health Maintenance Due  Topic Date Due  . TETANUS/TDAP  07/02/1998     Review of Systems Per hpi. Continues to struggle with addiction and depression. No thought to harm self. Otherwise 10 point ros is negative Physical Exam   BP 128/81   Pulse 77   Temp 98.3 F (36.8 C) (Oral)   Ht 5\' 9"  (1.753 m)   Wt 237 lb (107.5 kg)   BMI 35.00 kg/m   Physical Exam  Constitutional: He is oriented to person, place, and time. He appears well-developed and well-nourished. No distress.  HENT:  Mouth/Throat: Oropharynx is clear and moist. No oropharyngeal exudate.  Cardiovascular: Normal rate, regular rhythm and normal heart sounds. Exam reveals no gallop and no friction rub.  No murmur heard.  Pulmonary/Chest: Effort normal and breath sounds normal. No respiratory distress. He has no wheezes.  Abdominal: Soft. Bowel sounds are normal. He exhibits no distension. There is no tenderness.  Lymphadenopathy:  He has no cervical adenopathy.  Neurological: He is alert and oriented to person, place, and time.  Skin: Skin is warm and dry. No rash noted. No erythema.  Psychiatric: He has a normal mood and affect. His behavior is normal.    Lab Results  Component Value Date   CD4TCELL 25 (L) 04/18/2016   Lab Results  Component Value Date   CD4TABS 520 04/18/2016   CD4TABS 730 09/19/2015   CD4TABS 850 03/30/2015   Lab Results  Component Value Date   HIV1RNAQUANT <20  04/18/2016   Lab Results  Component Value Date   HEPBSAB NEG 08/17/2013   Lab Results  Component Value Date   LABRPR NON REAC 04/18/2016    CBC Lab Results  Component Value Date   WBC 5.1 04/18/2016   RBC 4.97 04/18/2016   HGB 15.3 04/18/2016   HCT 44.6 04/18/2016   PLT 214 04/18/2016   MCV 89.7 04/18/2016   MCH 30.8 04/18/2016   MCHC 34.3 04/18/2016   RDW 13.8 04/18/2016   LYMPHSABS 2,040 04/18/2016   MONOABS 408 04/18/2016   EOSABS 153 04/18/2016     BMET Lab Results  Component Value Date   NA 139 04/18/2016   K 4.3 04/18/2016   CL 102 04/18/2016   CO2 24 04/18/2016   GLUCOSE 81 04/18/2016   BUN 12 04/18/2016   CREATININE 1.01 04/18/2016   CALCIUM 9.4 04/18/2016   GFRNONAA >89 04/18/2016   GFRAA >89 04/18/2016      Assessment and Plan  hiv disease= well controlled  Depression = getting weekly counseling and hew meds started.  healt maintenance = will get mengiococcal vax

## 2016-07-18 NOTE — Patient Instructions (Signed)
Labs 2 wk prior to visit

## 2016-09-05 ENCOUNTER — Other Ambulatory Visit: Payer: Medicaid Other

## 2016-09-06 ENCOUNTER — Other Ambulatory Visit: Payer: Self-pay | Admitting: Internal Medicine

## 2016-09-06 DIAGNOSIS — B20 Human immunodeficiency virus [HIV] disease: Secondary | ICD-10-CM

## 2016-09-06 DIAGNOSIS — F429 Obsessive-compulsive disorder, unspecified: Secondary | ICD-10-CM

## 2016-09-12 ENCOUNTER — Other Ambulatory Visit (INDEPENDENT_AMBULATORY_CARE_PROVIDER_SITE_OTHER): Payer: Self-pay

## 2016-09-12 DIAGNOSIS — B20 Human immunodeficiency virus [HIV] disease: Secondary | ICD-10-CM

## 2016-09-12 DIAGNOSIS — Z79899 Other long term (current) drug therapy: Secondary | ICD-10-CM

## 2016-09-12 LAB — LIPID PANEL
CHOLESTEROL: 214 mg/dL — AB (ref <200)
HDL: 48 mg/dL (ref >40)
LDL CALC: 138 mg/dL — AB (ref <100)
Total CHOL/HDL Ratio: 4.5 Ratio (ref <5.0)
Triglycerides: 139 mg/dL (ref <150)
VLDL: 28 mg/dL (ref <30)

## 2016-09-13 LAB — T-HELPER CELL (CD4) - (RCID CLINIC ONLY)
CD4 % Helper T Cell: 21 % — ABNORMAL LOW (ref 33–55)
CD4 T Cell Abs: 580 /uL (ref 400–2700)

## 2016-09-14 LAB — HIV-1 RNA QUANT-NO REFLEX-BLD
HIV 1 RNA QUANT: NOT DETECTED {copies}/mL
HIV-1 RNA QUANT, LOG: NOT DETECTED {Log_copies}/mL

## 2016-09-19 ENCOUNTER — Encounter: Payer: Self-pay | Admitting: Internal Medicine

## 2016-09-19 ENCOUNTER — Ambulatory Visit (INDEPENDENT_AMBULATORY_CARE_PROVIDER_SITE_OTHER): Payer: Self-pay | Admitting: Internal Medicine

## 2016-09-19 VITALS — BP 132/76 | HR 77 | Temp 97.6°F | Ht 70.0 in | Wt 235.0 lb

## 2016-09-19 DIAGNOSIS — F32A Depression, unspecified: Secondary | ICD-10-CM

## 2016-09-19 DIAGNOSIS — Z113 Encounter for screening for infections with a predominantly sexual mode of transmission: Secondary | ICD-10-CM

## 2016-09-19 DIAGNOSIS — F329 Major depressive disorder, single episode, unspecified: Secondary | ICD-10-CM

## 2016-09-19 DIAGNOSIS — F1510 Other stimulant abuse, uncomplicated: Secondary | ICD-10-CM

## 2016-09-19 DIAGNOSIS — B20 Human immunodeficiency virus [HIV] disease: Secondary | ICD-10-CM

## 2016-09-19 NOTE — Progress Notes (Signed)
RFV: hiv disease follow up  Patient ID: Jimmy Davis, male   DOB: [birth year], 36 y.o.   MRN: [id number]  HPI Jimmy Davis is a 36 yo M with well controlled hiv diseae but does has substance abuse issues. He reports that he Relapsed with meth use since December as well as depression. He still maintains to do weekly counseling. Off of antidepressant until he sees ads providers again.  Using meth twice a week  maintains going to [employer]  I have reviewed his records in [facility] link  Outpatient Encounter Prescriptions as of 09/19/2016  Medication Sig  . aspirin 325 MG tablet Take 325 mg by mouth every 6 (six) hours as needed for mild pain.  . cetirizine (ZYRTEC) 10 MG tablet Take 1 tablet (10 mg total) by mouth daily.  Marland Kitchen. FLUoxetine (PROZAC) 40 MG capsule TAKE 2 CAPSULES(80 MG) BY MOUTH DAILY  . GENVOYA 150-150-200-10 MG TABS tablet TAKE 1 TABLET BY MOUTH DAILY WITH BREAKFAST  . polyethylene glycol powder (GLYCOLAX/MIRALAX) powder Take 17 g by mouth daily.  . Sodium Chloride-Xylitol (XLEAR SINUS CARE SPRAY NA) Place 1 spray into the nose daily as needed (CONGESTION). Reported on 10/31/2015  . ziprasidone (GEODON) 20 MG capsule Take 20 mg by mouth 2 (two) times daily with a meal.  . Multiple Vitamin (MULTIVITAMIN) capsule Take 1 capsule by mouth daily. (Patient not taking: Reported on 09/19/2016)  . [DISCONTINUED] buPROPion (WELLBUTRIN SR) 150 MG 12 hr tablet TK 1 T PO D FOR 1 WEEK. INCREASE TO 2 XD   No facility-administered encounter medications on file as of 09/19/2016.      Patient Active Problem List   Diagnosis Date Noted  . Major depressive disorder, recurrent episode, moderate (HCC) 11/17/2015  . 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 Cellulitis of left lower extremity 12/07/2014  . Sepsis (HCC) 12/07/2014  . Cellulitis 12/07/2014  . Substance abuse 10/03/2014  . Depression 10/03/2014  . Chronic diarrhea 07/27/2013  . Human immunodeficiency virus (HIV) disease (HCC)  06/29/2013     Health Maintenance Due  Topic Date Due  . Jimmy Davis  07/02/1998   Social History  Substance Use Topics  . Smoking status: Never Smoker  . Smokeless tobacco: Never Used  . Alcohol use No    Review of Systems Mainly depression, otherwise 10 point ros is negative. He is not having any thoughs of harm to self or others.doesn't think that attending treatment groups would work with his schedule Physical Exam   BP 132/76   Pulse 77   Temp 97.6 F (36.4 C) (Oral)   Ht 5\' 10"  (1.778 m)   Wt 235 lb (106.6 kg)   BMI 33.72 kg/m    Lab Results  Component Value Date   CD4TCELL 21 (L) 09/12/2016   Lab Results  Component Value Date   CD4TABS 580 09/12/2016   CD4TABS 520 04/18/2016   CD4TABS 730 09/19/2015   Lab Results  Component Value Date   HIV1RNAQUANT <20 NOT DETECTED 09/12/2016   Lab Results  Component Value Date   HEPBSAB NEG 08/17/2013   Lab Results  Component Value Date   LABRPR NON REAC 04/18/2016    CBC Lab Results  Component Value Date   WBC 5.1 04/18/2016   RBC 4.97 04/18/2016   HGB 15.3 04/18/2016   HCT 44.6 04/18/2016   PLT 214 04/18/2016   MCV 89.7 04/18/2016   MCH 30.8 04/18/2016   MCHC 34.3 04/18/2016   RDW 13.8 04/18/2016   LYMPHSABS 2,040 04/18/2016  MONOABS 408 04/18/2016   EOSABS 153 04/18/2016    BMET Lab Results  Component Value Date   NA 139 04/18/2016   K 4.3 04/18/2016   CL 102 04/18/2016   CO2 24 04/18/2016   GLUCOSE 81 04/18/2016   BUN 12 04/18/2016   CREATININE 1.01 04/18/2016   CALCIUM 9.4 04/18/2016   GFRNONAA >89 04/18/2016   GFRAA >89 04/18/2016      Assessment and Plan  hiv disease = well controlled by looking at his labs, continue on current regimen  Meth use = likely compensating for poorly controlled depression. Asked him to move up appt with ads to see what other meds to try. Continue with weekly counseling. Discussed harm reduction methods  Health maintenance = vaccines are  uptodate  rtc in 3 months  Spent 30 min with him in direct face to face contact/counseling for meth use and harm reduction

## 2016-10-23 ENCOUNTER — Encounter (HOSPITAL_COMMUNITY): Payer: Self-pay

## 2016-10-23 ENCOUNTER — Emergency Department (HOSPITAL_COMMUNITY): Payer: Medicaid Other

## 2016-10-23 ENCOUNTER — Emergency Department (HOSPITAL_COMMUNITY)
Admission: EM | Admit: 2016-10-23 | Discharge: 2016-10-23 | Disposition: A | Discharge status: Home / Self Care | Payer: Medicaid Other | Attending: Emergency Medicine | Admitting: Emergency Medicine

## 2016-10-23 DIAGNOSIS — Y929 Unspecified place or not applicable: Secondary | ICD-10-CM | POA: Insufficient documentation

## 2016-10-23 DIAGNOSIS — Z79899 Other long term (current) drug therapy: Secondary | ICD-10-CM | POA: Insufficient documentation

## 2016-10-23 DIAGNOSIS — S93602A Unspecified sprain of left foot, initial encounter: Secondary | ICD-10-CM | POA: Insufficient documentation

## 2016-10-23 DIAGNOSIS — Y9301 Activity, walking, marching and hiking: Secondary | ICD-10-CM | POA: Insufficient documentation

## 2016-10-23 DIAGNOSIS — Y999 Unspecified external cause status: Secondary | ICD-10-CM | POA: Insufficient documentation

## 2016-10-23 DIAGNOSIS — Z7982 Long term (current) use of aspirin: Secondary | ICD-10-CM | POA: Insufficient documentation

## 2016-10-23 DIAGNOSIS — W010XXA Fall on same level from slipping, tripping and stumbling without subsequent striking against object, initial encounter: Secondary | ICD-10-CM | POA: Insufficient documentation

## 2016-10-23 MED ORDER — ACETAMINOPHEN 500 MG PO TABS: 500.0000 mg | ORAL_TABLET | Freq: Four times a day (QID) | ORAL | 0 refills | Status: DC | PRN

## 2016-10-23 NOTE — ED Notes (Signed)
ED Provider at bedside. 

## 2016-10-23 NOTE — ED Triage Notes (Addendum)
Pt c/o left foot pain after falling at 2000 last night. Pt got tripped up and caught self on countertop. Denies any other pain or injury. No swelling reported.

## 2016-10-23 NOTE — ED Provider Notes (Signed)
[facility]-EMERGENCY DEPT Provider Note   CSN: [id number] Arrival date & time: 10/23/16  8657     History   Chief Complaint Chief Complaint  Patient presents with  . Foot Pain    Left    HPI Jimmy Davis is a 36 y.o. male.  HPI   36 year old male with history of HIV, substance abuse, anxiety and depression presenting complaining of foot pain. Patient reports yesterday he was sitting on a chair for a prolonged period of time and his foot fell asleep. He stood up and walked around when he may have twisted his left foot in the process. He did not fell and did not injure any other body part. He does complaining of sharp throbbing, nonradiating 8 out of 10 pain to the dorsum of his left foot but no pain to his ankle. He has not tried any specific treatment. He denies any prior injury to the same foot. No other complaint.  Past Medical History:  Diagnosis Date  . Anxiety   . Depression   . Human immunodeficiency virus (HIV) disease (HCC)   . Substance abuse     Patient Active Problem List   Diagnosis Date Noted  . Major depressive disorder, recurrent episode, moderate (HCC) 11/17/2015  . 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 Cellulitis of left lower extremity 12/07/2014  . Sepsis (HCC) 12/07/2014  . Cellulitis 12/07/2014  . Substance abuse 10/03/2014  . Depression 10/03/2014  . Chronic diarrhea 07/27/2013  . Human immunodeficiency virus (HIV) disease (HCC) 06/29/2013    Past Surgical History:  Procedure Laterality Date  . WISDOM TOOTH EXTRACTION         Home Medications    Prior to Admission medications   Medication Sig Start Date End Date Taking? Authorizing Provider  aspirin 325 MG tablet Take 325 mg by mouth every 6 (six) hours as needed for mild pain.    Historical Provider, MD  cetirizine (ZYRTEC) 10 MG tablet Take 1 tablet (10 mg total) by mouth daily. 06/28/15   Judyann Munson, MD  FLUoxetine (PROZAC) 40 MG capsule TAKE 2 CAPSULES(80 MG) BY MOUTH DAILY 09/09/16    Judyann Munson, MD  GENVOYA 150-150-200-10 MG TABS tablet TAKE 1 TABLET BY MOUTH DAILY WITH BREAKFAST 09/09/16   Judyann Munson, MD  Multiple Vitamin (MULTIVITAMIN) capsule Take 1 capsule by mouth daily. Patient not taking: Reported on 09/19/2016 11/17/15   Thermon Leyland, NP  polyethylene glycol powder (GLYCOLAX/MIRALAX) powder Take 17 g by mouth daily. 01/01/16   Renne Crigler, PA-C  Sodium Chloride-Xylitol (XLEAR SINUS CARE SPRAY NA) Place 1 spray into the nose daily as needed (CONGESTION). Reported on 10/31/2015    Historical Provider, MD  ziprasidone (GEODON) 20 MG capsule Take 20 mg by mouth 2 (two) times daily with a meal.    Historical Provider, MD    Family History Family History  Problem Relation Age of Onset  . Heart disease Father   . Mental illness Maternal Grandmother     Social History Social History  Substance Use Topics  . Smoking status: Never Smoker  . Smokeless tobacco: Never Used  . Alcohol use No     Allergies   Patient has no known allergies.   Review of Systems Review of Systems  Constitutional: Negative for fever.  Skin: Negative for color change, rash and wound.  Neurological: Negative for numbness.     Physical Exam Updated Vital Signs BP (!) 148/98 (BP Location: Right Arm)   Pulse (!) 114   Temp 98 F (  36.7 C) (Oral)   Resp 20   SpO2 100%   Physical Exam  Constitutional: He appears well-developed and well-nourished. No distress.  HENT:  Head: Atraumatic.  Eyes: Conjunctivae are normal.  Neck: Neck supple.  Musculoskeletal: He exhibits tenderness (L foot: mild tenderness to dorsum of L foot without swelling, crepitus, emphysema or overlying skin changes.  pedal pulse palpable, brisk cap refill. L ankle nontender).  Neurological: He is alert.  Skin: No rash noted.  Psychiatric: He has a normal mood and affect.  Nursing note and vitals reviewed.    ED Treatments / Results  Labs (all labs ordered are listed, but only abnormal results are  displayed) Labs Reviewed - No data to display  EKG  EKG Interpretation None       Radiology Dg Foot Complete Left  Result Date: 10/23/2016 CLINICAL DATA:  Trip and fall with left foot pain, initial encounter EXAM: LEFT FOOT - COMPLETE 3+ VIEW COMPARISON:  None. FINDINGS: There is no evidence of fracture or dislocation. There is no evidence of arthropathy or other focal bone abnormality. Soft tissues are unremarkable. IMPRESSION: No acute abnormality Electronically Signed   By: Alcide Clever M.D.   On: 10/23/2016 07:29    Procedures Procedures (including critical care time)  Medications Ordered in ED Medications - No data to display   Initial Impression / Assessment and Plan / ED Course  I have reviewed the triage vital signs and the nursing notes.  Pertinent labs & imaging results that were available during my care of the patient were reviewed by me and considered in my medical decision making (see chart for details).     BP (!) 148/98 (BP Location: Right Arm)   Pulse (!) 114   Temp 98 F (36.7 C) (Oral)   Resp 20   SpO2 100%    Final Clinical Impressions(s) / ED Diagnoses   Final diagnoses:  Foot sprain, left, initial encounter    New Prescriptions New Prescriptions   No medications on file   7:49 AM Pt injured his L foot yesterday.  Xray negative.  Pt NVI.  No evidence of infection.  Hx HIV, well controlled.  Able to ambulate.  ACE wrap applied, RICE therapy discussed.    Fayrene Helper, PA-C 10/23/16 [id number]    Tilden Fossa, MD 10/23/16 [phone]

## 2016-12-09 ENCOUNTER — Ambulatory Visit (INDEPENDENT_AMBULATORY_CARE_PROVIDER_SITE_OTHER): Payer: Self-pay | Admitting: Psychology

## 2016-12-09 DIAGNOSIS — F1520 Other stimulant dependence, uncomplicated: Secondary | ICD-10-CM

## 2016-12-09 DIAGNOSIS — F331 Major depressive disorder, recurrent, moderate: Secondary | ICD-10-CM

## 2016-12-10 ENCOUNTER — Encounter (HOSPITAL_COMMUNITY): Payer: Self-pay | Admitting: Psychology
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
## 2016-12-11 ENCOUNTER — Encounter (HOSPITAL_COMMUNITY): Payer: Self-pay

## 2016-12-11 ENCOUNTER — Other Ambulatory Visit (HOSPITAL_COMMUNITY): Payer: Self-pay | Attending: Psychiatry | Admitting: Medical

## 2016-12-11 VITALS — BP 120/84 | HR 89 | Ht 68.5 in | Wt 225.0 lb

## 2016-12-11 DIAGNOSIS — F1523 Other stimulant dependence with withdrawal: Secondary | ICD-10-CM | POA: Insufficient documentation

## 2016-12-11 DIAGNOSIS — Z79899 Other long term (current) drug therapy: Secondary | ICD-10-CM | POA: Insufficient documentation

## 2016-12-11 DIAGNOSIS — F331 Major depressive disorder, recurrent, moderate: Secondary | ICD-10-CM | POA: Insufficient documentation

## 2016-12-11 DIAGNOSIS — B20 Human immunodeficiency virus [HIV] disease: Secondary | ICD-10-CM | POA: Insufficient documentation

## 2016-12-11 DIAGNOSIS — F1520 Other stimulant dependence, uncomplicated: Secondary | ICD-10-CM

## 2016-12-11 DIAGNOSIS — F1910 Other psychoactive substance abuse, uncomplicated: Secondary | ICD-10-CM

## 2016-12-11 DIAGNOSIS — F429 Obsessive-compulsive disorder, unspecified: Secondary | ICD-10-CM | POA: Insufficient documentation

## 2016-12-11 DIAGNOSIS — F10129 Alcohol abuse with intoxication, unspecified: Secondary | ICD-10-CM | POA: Insufficient documentation

## 2016-12-11 DIAGNOSIS — R4681 Obsessive-compulsive behavior: Secondary | ICD-10-CM

## 2016-12-11 DIAGNOSIS — F419 Anxiety disorder, unspecified: Secondary | ICD-10-CM | POA: Insufficient documentation

## 2016-12-11 DIAGNOSIS — F19182 Other psychoactive substance abuse with psychoactive substance-induced sleep disorder: Secondary | ICD-10-CM | POA: Insufficient documentation

## 2016-12-11 DIAGNOSIS — Z7982 Long term (current) use of aspirin: Secondary | ICD-10-CM | POA: Insufficient documentation

## 2016-12-11 MED ORDER — BACLOFEN 10 MG PO TABS: 10.0000 mg | ORAL_TABLET | Freq: Three times a day (TID) | ORAL | 1 refills | Status: DC
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
## 2016-12-12 ENCOUNTER — Encounter (HOSPITAL_COMMUNITY): Payer: Self-pay | Admitting: Psychology

## 2016-12-12 ENCOUNTER — Other Ambulatory Visit (HOSPITAL_COMMUNITY): Payer: Self-pay

## 2016-12-13 ENCOUNTER — Encounter (HOSPITAL_COMMUNITY): Payer: Self-pay | Admitting: Psychology

## 2016-12-13 DIAGNOSIS — R4681 Obsessive-compulsive behavior: Secondary | ICD-10-CM | POA: Insufficient documentation

## 2016-12-13 NOTE — Progress Notes (Signed)
Jimmy Davis is a 36 y.o. male patient. CD-IOP: The patient emailed counselor stating that he would not be in group today. He reported, "I'm not feeling well". Despite retuning the email and encouraging him to come, the patient did not appear. His first group session was yesterday. He must understand that when patients first begin the program they often feel badly. He will be drug tested when he appears for group next week. Will follow closely - he is a scholarship recipient.        Jimmy MuffAnn Azjah Pardo, LCAS
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
## 2016-12-16 ENCOUNTER — Other Ambulatory Visit (HOSPITAL_COMMUNITY): Payer: Self-pay | Admitting: Psychology

## 2016-12-16 DIAGNOSIS — F331 Major depressive disorder, recurrent, moderate: Secondary | ICD-10-CM

## 2016-12-16 DIAGNOSIS — F1520 Other stimulant dependence, uncomplicated: Secondary | ICD-10-CM

## 2016-12-18 ENCOUNTER — Encounter (HOSPITAL_COMMUNITY): Payer: Self-pay | Admitting: Licensed Clinical Social Worker

## 2016-12-18 ENCOUNTER — Encounter (HOSPITAL_COMMUNITY): Payer: Self-pay | Admitting: Psychology

## 2016-12-18 ENCOUNTER — Other Ambulatory Visit (HOSPITAL_BASED_OUTPATIENT_CLINIC_OR_DEPARTMENT_OTHER): Payer: Self-pay | Admitting: Psychology

## 2016-12-18 DIAGNOSIS — F419 Anxiety disorder, unspecified: Secondary | ICD-10-CM

## 2016-12-18 DIAGNOSIS — F1520 Other stimulant dependence, uncomplicated: Secondary | ICD-10-CM

## 2016-12-18 DIAGNOSIS — F331 Major depressive disorder, recurrent, moderate: Secondary | ICD-10-CM

## 2016-12-18 NOTE — Progress Notes (Signed)
    Daily Group Progress Note  Program: CD-IOP   12/18/2016 Lovett SoxJonathan Bihm [id number]  Diagnosis:  Methamphetamine use disorder, severe, dependence (HCC)  Major depressive disorder, recurrent episode, moderate (HCC)  Anxiety   Sobriety Date: 6/6  Group Time: 1-2:30  Participation Level: Active  Behavioral Response: Appropriate and Sharing  Type of Therapy: Process Group  Interventions: CBT  Topic: Patients were active and engaged in group session. One member returned to the group after a week in the hospital for heart problems. The group discussed their individual recovery plans and any "shining moments" and "speedbumps" that impacted their recovery. UDS results were returned to some pts.      Group Time: 2:30-4  Participation Level: Active  Behavioral Response: Appropriate and Sharing  Type of Therapy: Psycho-education Group  Interventions: DBT  Topic: Patients were active and engaged for psyschoeducation session. Counselors instructed pts on DBT skills for distress tolerance and changing their brain chemistry during active cravings. Pts responded positively and identified specific ways to implement the skills into their lives. Counselors then led a activity about keeping a "daily self-inventory".    Summary: Patient was active and engaged in group. He reported he attended 1 AA meeting and was happy to say he got a new temporary sponsor. [place] he wants someone in person. Counselor and group encouraged this decision. Pt appeared more upbeat and smiled throughout session. He admitted he needs to work on recovery "even when he does not feel like it". Pt stated he resolved the issue with his cousin who is no longer expressing SI. Pt was vocal and agreeable in session and showed a marked decrease in defensive behavior and rationalizing.    UDS collected: No Results: positive for methamphetamines  AA/NA attended?: YesMonday  Sponsor?: No   Dorann LodgeWes Swan, LPCA 12/18/2016 6:22 PM

## 2016-12-18 NOTE — Progress Notes (Signed)
CD-IOP INDIVIDUAL TX PLANNING SESSION  THERAPIST PROGRESS NOTE  Session Time: 4:10-5:05pm  Participation Level: Active  Behavioral Response: Casual and DisheveledAlert and ConfusedAnxious  Type of Therapy: Individual Therapy  Treatment Goals addressed: Diagnosis: SUD  Interventions: Motivational Interviewing and Solution Focused  Summary: Jimmy Davis is a 36 y.o. male who presents with Methamphetamine Use Disorder, social phobia, hx of OCD dx, and significant motivation to change current meth use. He admits he is sober now and "most of him wants to stay that way". Pt responded well to MI and was active and engaged in initial tx planning session. He signed all necessary documentation and established his goals of tx as sobriety, social support, acquire interview skills, and acquire interpersonal skills. He was somewhat muted and shaky during session. He worked with counselor on a weekly schedule that will help him create healthy behavioral changes. He stated he wants to attend AA daily since he is not working. He admitted he needs to cut down on his internet and gaming use. He also agreed to wake up daily by 8:30am and attend a morning meeting.    Suicidal/Homicidal: Nowithout intent/plan  Therapist Response: Pt responded well to MI, open questions, and genuine positive regard. Counselor utilized a Surveyor, mineralspros/cons list from MI which helped pt to recognize lack of evidence to continue his addiction.   Plan: Return again in 1 week for individual session.  Diagnosis:    ICD-10-CM   1. 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 Major depressive disorder, recurrent episode, moderate (HCC) F33.1   3. Anxiety F41.9        Margo CommonWesley E Swan, LPCA 12/18/2016

## 2016-12-19 ENCOUNTER — Other Ambulatory Visit (HOSPITAL_BASED_OUTPATIENT_CLINIC_OR_DEPARTMENT_OTHER): Payer: Self-pay | Admitting: Psychology

## 2016-12-19 DIAGNOSIS — F331 Major depressive disorder, recurrent, moderate: Secondary | ICD-10-CM

## 2016-12-19 DIAGNOSIS — F1520 Other stimulant dependence, uncomplicated: Secondary | ICD-10-CM

## 2016-12-23 ENCOUNTER — Other Ambulatory Visit (HOSPITAL_BASED_OUTPATIENT_CLINIC_OR_DEPARTMENT_OTHER): Payer: Self-pay | Admitting: Psychology

## 2016-12-23 ENCOUNTER — Encounter (HOSPITAL_COMMUNITY): Payer: Self-pay | Admitting: Psychology

## 2016-12-23 DIAGNOSIS — F411 Generalized anxiety disorder: Secondary | ICD-10-CM

## 2016-12-23 DIAGNOSIS — F331 Major depressive disorder, recurrent, moderate: Secondary | ICD-10-CM

## 2016-12-23 DIAGNOSIS — F1520 Other stimulant dependence, uncomplicated: Secondary | ICD-10-CM

## 2016-12-23 DIAGNOSIS — R4681 Obsessive-compulsive behavior: Secondary | ICD-10-CM
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
## 2016-12-23 NOTE — Progress Notes (Signed)
    Daily Group Progress Note  Program: CD-IOP   12/23/2016 Jimmy Davis [id number]  Diagnosis:  No diagnosis found.   Sobriety Date: 12/11/16  Group Time: 1-2:30pm  Participation Level: Active  Behavioral Response: Sharing  Type of Therapy: Process Group  Interventions: Supportive  Topic: Process: the first half of group was spent in process. Members shared about the challenges and struggles of early recovery. During Thursday's session, each member picks a tongue depressor with a word or phrase on it. They are asked to share what this represents or means to them and how it is playing out in their lives. Random drug tests were collected today.   Group Time: 2:30-4pm  Participation Level: Active  Behavioral Response: Sharing  Type of Therapy: Activity Group  Interventions: Solution Focused  Topic: Activity: Jenga. The second half of group was spent in an activity playing Jenga. Members remove a block from the structure and read the word that is written on the block. The words are all taken from the 'Feeling Wheel'. They are asked to identify how they relate to this word. The session was very lively and powerful with members identifying, in a number of instances, deep and emotional connections. This activity seemed to promote more comradery and closeness among the group members.   Summary: The patient reported he had attended an AA meeting last night. He had had a 'bad dream' and described 'arguing intensely with my mother about money. It had been a terrible argument' and was very upsetting for him when he awoke today. His speed bump was that he had not gotten up this morning as he had agreed to do with his counselor. They had devised a schedule for him to begin following since any routine or schedule has been absent from this fellow's life for years. Despite making the commitment, he had not followed through and slept in today. At the AA meeting he attended, they had a brief  meditation, which he had enjoyed. The talk was about gratitude, which was one of the blocks he pulled from the ReddickJenga activity. The patient explained that in the meeting he had learned that this word 'gratitude' was 'an action word'. That was an important recognition for him and he understood that gratitude is to be acted or displayed through actions. As the session ended, the patient shared about his plans for the upcoming weekend. He had agreed with his counselor that he would phone him upon arising tomorrow and get going on the schedule that they have put together. He was encouraged to attend as many meetings as possible and stay away from his apartment, where he has isolated for much of the last three years. The patient seemed determined and comfortable with his plans, but acknowledged he would have to be attentive and keep himself occupied. He responded well to this intervention.    UDS collected: No Results:   AA/NA attended?: YesWednesday  Sponsor?: No   Charmian Muffnn Nezar Buckles, LCAS 12/23/2016 11:17 AM

## 2016-12-24 ENCOUNTER — Encounter (HOSPITAL_COMMUNITY): Payer: Self-pay | Admitting: Psychology

## 2016-12-24 NOTE — Progress Notes (Signed)
    Daily Group Progress Note  Program: CD-IOP   12/24/2016 Eilene Ghazi [id number]  Diagnosis:  Methamphetamine use disorder, severe, dependence ([facility])  Major depressive disorder, recurrent episode, moderate (HCC)  Generalized anxiety disorder  Obsessive-compulsive behavior   Sobriety Date: NA, Arrived having used in AM  Group Time: 1-2:30  Participation Level: Active  Behavioral Response: Appropriate, Sharing and Agitated  Type of Therapy: Process Group  Interventions: CBT and Motivational Interviewing  Topic: Patients were active and engaged in session. Drug tests were collected from multiple pts. One pt presented as especially agitated and fidgety and admitted that he had used earlier that morning. Safety and group dynamics were assessed by counselors who admitted pt to stay. Group was encouraging and supportive of pt. Pts shared about their struggles and successes in early recovery, highlighting used skills and recovery thinking. One pt no call/no show and one pt called to say he would not be attending due to health issues. One pt was present for her first group and met with medical director to establish care.     Group Time: 2:30-4  Participation Level: Active  Behavioral Response: Appropriate and Sharing  Type of Therapy: Psycho-education Group  Interventions: CBT and Reframing  Topic: Counselors led patients in an active psychoed session teaching the ABC model of CBT to help pt challenge their automatic thoughts and irrational beliefs. Pts were engaged and participated openly. Handouts were given to pts. Pts were instructed to document 2 ABC events in the next 48 hours.   Summary: Patient was active and engaged in session despite arriving with red eyes and aggitated movements. He admitted he does not have a sobriety date since he had smoked meth that morning. Counselor assessed for pt's immediate safety, intoxication level, lucidity, and coherence. Pt was  able to verbalize understanding and agreed he wanted to stay in group with the support of other group members. Members were understanding and said they would rather him stay in group than leave. Counselor asked pt to observe for first hour to allow himself to adjust to group. Pt agreed. When pt was questioned later about his drug use he admitted he had contacted his dealer earlier last week and asked for drugs. He reported feeling let down when the drugs that were supposed to arrive last Thursday did not and forced him into a depression until Saturday when the meth arrived and he smoked "a lot throughout the day". He admitted there was still about 2-3 gr of meth in his house and was advised to dispose of it with the help of a friend in [place]. Pt was hesitant but admitted it was best to dispose of it. Counselor used clear concise language to convey that pt may not be able to stop using Meth in an IOP setting. Pt agreed but stated he wants to continue at this LOS. Pt stated he "felt like a failure" since he used which provided a good opportunity for counselor to teach about relapse and recovery. Pt was validated and supported throughout session and encouraged to attend a meeting and get back to his schedule that he had made with his counselor.    UDS collected: No Results: Self Reported Methamphetamine use over weekend.  AA/NA attended?: No  Sponsor?: No   Youlanda Roys, LPCA 12/24/2016 8:23 AM

## 2016-12-25 ENCOUNTER — Encounter (HOSPITAL_COMMUNITY): Payer: Self-pay | Admitting: Emergency Medicine

## 2016-12-25 ENCOUNTER — Other Ambulatory Visit (HOSPITAL_BASED_OUTPATIENT_CLINIC_OR_DEPARTMENT_OTHER): Payer: Self-pay | Admitting: Psychology

## 2016-12-25 ENCOUNTER — Emergency Department (HOSPITAL_COMMUNITY)
Admission: EM | Admit: 2016-12-25 | Discharge: 2016-12-25 | Disposition: A | Discharge status: Home / Self Care | Payer: Medicaid Other | Attending: Emergency Medicine | Admitting: Emergency Medicine

## 2016-12-25 DIAGNOSIS — Z7982 Long term (current) use of aspirin: Secondary | ICD-10-CM | POA: Insufficient documentation

## 2016-12-25 DIAGNOSIS — R0602 Shortness of breath: Secondary | ICD-10-CM | POA: Insufficient documentation

## 2016-12-25 DIAGNOSIS — F4323 Adjustment disorder with mixed anxiety and depressed mood: Secondary | ICD-10-CM | POA: Insufficient documentation

## 2016-12-25 DIAGNOSIS — E871 Hypo-osmolality and hyponatremia: Secondary | ICD-10-CM | POA: Insufficient documentation

## 2016-12-25 DIAGNOSIS — Z79899 Other long term (current) drug therapy: Secondary | ICD-10-CM | POA: Insufficient documentation

## 2016-12-25 DIAGNOSIS — E876 Hypokalemia: Secondary | ICD-10-CM | POA: Insufficient documentation

## 2016-12-25 DIAGNOSIS — F1520 Other stimulant dependence, uncomplicated: Secondary | ICD-10-CM

## 2016-12-25 DIAGNOSIS — R509 Fever, unspecified: Secondary | ICD-10-CM | POA: Insufficient documentation

## 2016-12-25 DIAGNOSIS — B20 Human immunodeficiency virus [HIV] disease: Secondary | ICD-10-CM | POA: Insufficient documentation

## 2016-12-25 DIAGNOSIS — I10 Essential (primary) hypertension: Secondary | ICD-10-CM | POA: Insufficient documentation

## 2016-12-25 DIAGNOSIS — F1510 Other stimulant abuse, uncomplicated: Secondary | ICD-10-CM

## 2016-12-25 DIAGNOSIS — R197 Diarrhea, unspecified: Secondary | ICD-10-CM | POA: Insufficient documentation

## 2016-12-25 DIAGNOSIS — R109 Unspecified abdominal pain: Secondary | ICD-10-CM | POA: Insufficient documentation

## 2016-12-25 DIAGNOSIS — R112 Nausea with vomiting, unspecified: Secondary | ICD-10-CM

## 2016-12-25 DIAGNOSIS — F331 Major depressive disorder, recurrent, moderate: Secondary | ICD-10-CM

## 2016-12-25 DIAGNOSIS — E878 Other disorders of electrolyte and fluid balance, not elsewhere classified: Secondary | ICD-10-CM | POA: Insufficient documentation

## 2016-12-25 LAB — CBC
HCT: 38 % — ABNORMAL LOW (ref 39.0–52.0)
HEMOGLOBIN: 13.7 g/dL (ref 13.0–17.0)
MCH: 30.2 pg (ref 26.0–34.0)
MCHC: 36.1 g/dL — AB (ref 30.0–36.0)
MCV: 83.9 fL (ref 78.0–100.0)
PLATELETS: 236 10*3/uL (ref 150–400)
RBC: 4.53 MIL/uL (ref 4.22–5.81)
RDW: 12.3 % (ref 11.5–15.5)
WBC: 8.1 10*3/uL (ref 4.0–10.5)

## 2016-12-25 LAB — COMPREHENSIVE METABOLIC PANEL
ALBUMIN: 4.3 g/dL (ref 3.5–5.0)
ALK PHOS: 92 U/L (ref 38–126)
ALT: 21 U/L (ref 17–63)
AST: 20 U/L (ref 15–41)
Anion gap: 11 (ref 5–15)
BUN: 9 mg/dL (ref 6–20)
CALCIUM: 8.9 mg/dL (ref 8.9–10.3)
CO2: 22 mmol/L (ref 22–32)
CREATININE: 1.08 mg/dL (ref 0.61–1.24)
Chloride: 100 mmol/L — ABNORMAL LOW (ref 101–111)
GFR calc Af Amer: >60 mL/min (ref >60)
GFR calc non Af Amer: >60 mL/min (ref >60)
GLUCOSE: 116 mg/dL — AB (ref 65–99)
Potassium: 2.8 mmol/L — ABNORMAL LOW (ref 3.5–5.1)
SODIUM: 133 mmol/L — AB (ref 135–145)
Total Bilirubin: 0.9 mg/dL (ref 0.3–1.2)
Total Protein: 7.5 g/dL (ref 6.5–8.1)

## 2016-12-25 LAB — URINALYSIS, ROUTINE W REFLEX MICROSCOPIC
BILIRUBIN URINE: NEGATIVE
Glucose, UA: NEGATIVE mg/dL
HGB URINE DIPSTICK: NEGATIVE
Ketones, ur: NEGATIVE mg/dL
Leukocytes, UA: NEGATIVE
Nitrite: NEGATIVE
Protein, ur: NEGATIVE mg/dL
SPECIFIC GRAVITY, URINE: 1.001 — AB (ref 1.005–1.030)
pH: 7 (ref 5.0–8.0)

## 2016-12-25 LAB — LIPASE, BLOOD: Lipase: 21 U/L (ref 11–51)

## 2016-12-25 MED ORDER — POTASSIUM CHLORIDE CRYS ER 20 MEQ PO TBCR
40.0000 meq | EXTENDED_RELEASE_TABLET | Freq: Once | ORAL | Status: AC
  Administered 2016-12-25: 40 meq via ORAL
  Filled 2016-12-25: qty 2

## 2016-12-25 MED ORDER — ONDANSETRON HCL 4 MG/2ML IJ SOLN
4.0000 mg | Freq: Once | INTRAMUSCULAR | Status: AC
  Administered 2016-12-25: 4 mg via INTRAVENOUS
  Filled 2016-12-25: qty 2

## 2016-12-25 MED ORDER — SODIUM CHLORIDE 0.9 % IV BOLUS (SEPSIS)
1000.0000 mL | Freq: Once | INTRAVENOUS | Status: AC
  Administered 2016-12-25: 1000 mL via INTRAVENOUS

## 2016-12-25 MED ORDER — ONDANSETRON 4 MG PO TBDP: 4.0000 mg | ORAL_TABLET | Freq: Three times a day (TID) | ORAL | 0 refills | Status: DC | PRN

## 2016-12-25 NOTE — ED Notes (Signed)
Patient is alert and oriented x3.  He was given DC instructions and follow up visit instructions.  Patient gave verbal understanding.  He was DC ambulatory under his own power to home.  V/S stable.  He was not showing any signs of distress on DC 

## 2016-12-25 NOTE — Discharge Instructions (Signed)
Please drink plenty of fluids Take Zofran as needed for nausea Follow up with your counselor Return for worsening symptoms

## 2016-12-25 NOTE — ED Provider Notes (Signed)
[facility]-EMERGENCY DEPT Provider Note   CSN: [id number] Arrival date & time: 12/25/16  0248     History   Chief Complaint Chief Complaint  Patient presents with  . Diarrhea    HPI Jahbari Repinski is a 36 y.o. male who presents with abdominal cramping, nausea, dry heaves, diarrhea. Past medical history significant for substance abuse disorder, HIV+ (viral load is undetectable, CD4 count 580 as of March 2018). He says he last smoked methamphetamine at 8 PM. Afterwards he started developing subjective fever, chills, diffuse abdominal cramping, nausea, dry heaves, soft brown stool. He says this has happened before when he uses methamphetamines. He denies any sick contacts or recent travel. No recent antibiotics. He also reports shortness of breath due to inhalation of the drug.  HPI  Past Medical History:  Diagnosis Date  . Anxiety   . Depression   . Human immunodeficiency virus (HIV) disease (HCC)   . 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 Substance abuse     Patient Active Problem List   Diagnosis Date Noted  . Obsessive-compulsive behavior 12/13/2016  . Anxiety 12/11/2016  . Methamphetamine use disorder, severe, dependence (HCC) 12/09/2016  . Major depressive disorder, recurrent episode, moderate (HCC) 11/17/2015  . 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 Depression 10/03/2014  . Human immunodeficiency virus (HIV) disease (HCC) 06/29/2013    Past Surgical History:  Procedure Laterality Date  . WISDOM TOOTH EXTRACTION       Home Medications    Prior to Admission medications   Medication Sig Start Date End Date Taking? Authorizing Provider  acetaminophen (TYLENOL) 500 MG tablet Take 1 tablet (500 mg total) by mouth every 6 (six) hours as needed for mild pain or moderate pain. 10/23/16  Yes Fayrene Helper, PA-C  aspirin 325 MG tablet Take 325 mg by mouth every 6 (six) hours as needed for mild pain.   Yes [provider]  baclofen (LIORESAL) 10 MG tablet Take 1 tablet (10  mg total) by mouth 3 (three) times daily. 12/11/16 12/11/17 Yes Court Joy, PA-C  FLUoxetine (PROZAC) 40 MG capsule TAKE 2 CAPSULES(80 MG) BY MOUTH DAILY 09/09/16  Yes Judyann Munson, MD  GENVOYA 150-150-200-10 MG TABS tablet TAKE 1 TABLET BY MOUTH DAILY WITH BREAKFAST 09/09/16  Yes Judyann Munson, MD  polyethylene glycol powder (GLYCOLAX/MIRALAX) powder Take 17 g by mouth daily. 01/01/16  Yes Renne Crigler, PA-C  Sodium Chloride-Xylitol (XLEAR SINUS CARE SPRAY NA) Place 1 spray into the nose daily as needed (CONGESTION). Reported on 10/31/2015   Yes [provider]  cetirizine (ZYRTEC) 10 MG tablet Take 1 tablet (10 mg total) by mouth daily. 06/28/15   Judyann Munson, MD  Multiple Vitamin (MULTIVITAMIN) capsule Take 1 capsule by mouth daily. Patient not taking: Reported on 09/19/2016 11/17/15   Thermon Leyland, NP    Family History Family History  Problem Relation Age of Onset  . Heart disease Father   . Mental illness Maternal Grandmother     Social History Social History  Substance Use Topics  . Smoking status: Never Smoker  . Smokeless tobacco: Never Used  . Alcohol use No     Allergies   Patient has no known allergies.   Review of Systems Review of Systems  Constitutional: Positive for chills and fever.  Respiratory: Positive for shortness of breath. Negative for cough.   Cardiovascular: Negative for chest pain.  Gastrointestinal: Positive for abdominal pain, diarrhea, nausea and vomiting (dry heaves).  Genitourinary: Negative for dysuria and frequency.  Physical Exam Updated Vital Signs BP (!) 166/97 (BP Location: Left Arm)   Pulse 95   Temp 98.8 F (37.1 C) (Oral)   Resp 20   Ht 5\' 10"  (1.778 m)   Wt 99.8 kg (220 lb)   SpO2 97%   BMI 31.57 kg/m   Physical Exam  Constitutional: He is oriented to person, place, and time. He appears well-developed and well-nourished. No distress.  HENT:  Head: Normocephalic and atraumatic.  Eyes: Conjunctivae are  normal. Pupils are equal, round, and reactive to light. Right eye exhibits no discharge. Left eye exhibits no discharge. No scleral icterus.  Neck: Normal range of motion.  Cardiovascular: Normal rate and regular rhythm.  Exam reveals no gallop and no friction rub.   No murmur heard. Pulmonary/Chest: Effort normal and breath sounds normal. No respiratory distress. He has no wheezes. He has no rales. He exhibits no tenderness.  Abdominal: Soft. Bowel sounds are normal. He exhibits no distension and no mass. There is no tenderness. There is no rebound and no guarding. No hernia.  Neurological: He is alert and oriented to person, place, and time.  Skin: Skin is warm and dry.  Psychiatric: His mood appears anxious. He is hyperactive.  Nursing note and vitals reviewed.   ED Treatments / Results  Labs (all labs ordered are listed, but only abnormal results are displayed) Labs Reviewed  COMPREHENSIVE METABOLIC PANEL - Abnormal; Notable for the following:       Result Value   Sodium 133 (*)    Potassium 2.8 (*)    Chloride 100 (*)    Glucose, Bld 116 (*)    All other components within normal limits  CBC - Abnormal; Notable for the following:    HCT 38.0 (*)    MCHC 36.1 (*)    All other components within normal limits  URINALYSIS, ROUTINE W REFLEX MICROSCOPIC - Abnormal; Notable for the following:    Color, Urine COLORLESS (*)    Specific Gravity, Urine 1.001 (*)    All other components within normal limits  LIPASE, BLOOD    EKG  EKG Interpretation None       Radiology No results found.  Procedures Procedures (including critical care time)  Medications Ordered in ED Medications  sodium chloride 0.9 % bolus 1,000 mL (0 mLs Intravenous Stopped 12/25/16 0823)  potassium chloride SA (K-DUR,KLOR-CON) CR tablet 40 mEq (40 mEq Oral Given 12/25/16 0652)  ondansetron (ZOFRAN) injection 4 mg (4 mg Intravenous Given 12/25/16 [id number])     Initial Impression / Assessment and Plan / ED  Course  I have reviewed the triage vital signs and the nursing notes.  Pertinent labs & imaging results that were available during my care of the patient were reviewed by me and considered in my medical decision making (see chart for details).  36 year old male with N/V/D and cramping. He is hypertensive - likely due to meth use. Abdomen is soft, non-tender. CBC unremarkable. CMP remarkable for mild hyponatremia, hypokalemia (2.8), hypochloremia. Will give fluids, Zofran, and replace K and reassess.  After meds, pt reports feeling better. Will d/c with return precautions and rx for Zofran.  Final Clinical Impressions(s) / ED Diagnoses   Final diagnoses:  Methamphetamine use  Nausea vomiting and diarrhea    New Prescriptions New Prescriptions   No medications on file     Beryle QuantGekas, Kaymen Adrian Marie, PA-C 12/25/16 [id number]    Azalia Bilisampos, Kevin, MD 12/26/16 [phone]

## 2016-12-25 NOTE — ED Triage Notes (Signed)
Pt states last night around 10pm he started having abd cramping and diarrhea  Pt states he has been drinking water to stay hydrated  Pt has nausea without vomiting but states he has had dry heaves

## 2016-12-26 ENCOUNTER — Other Ambulatory Visit (HOSPITAL_BASED_OUTPATIENT_CLINIC_OR_DEPARTMENT_OTHER): Payer: Self-pay | Admitting: Psychology

## 2016-12-26 DIAGNOSIS — F411 Generalized anxiety disorder: Secondary | ICD-10-CM

## 2016-12-26 DIAGNOSIS — R4681 Obsessive-compulsive behavior: Secondary | ICD-10-CM

## 2016-12-26 DIAGNOSIS — F331 Major depressive disorder, recurrent, moderate: Secondary | ICD-10-CM

## 2016-12-26 DIAGNOSIS — F1520 Other stimulant dependence, uncomplicated: Secondary | ICD-10-CM

## 2016-12-27 ENCOUNTER — Encounter (HOSPITAL_COMMUNITY): Payer: Self-pay | Admitting: Psychology

## 2016-12-27 ENCOUNTER — Encounter (HOSPITAL_COMMUNITY): Payer: Self-pay | Admitting: Licensed Clinical Social Worker

## 2016-12-27 DIAGNOSIS — F1520 Other stimulant dependence, uncomplicated: Secondary | ICD-10-CM

## 2016-12-27 DIAGNOSIS — R4681 Obsessive-compulsive behavior: Secondary | ICD-10-CM

## 2016-12-27 DIAGNOSIS — F331 Major depressive disorder, recurrent, moderate: Secondary | ICD-10-CM

## 2016-12-27 DIAGNOSIS — F411 Generalized anxiety disorder: Secondary | ICD-10-CM

## 2016-12-27 NOTE — Progress Notes (Signed)
    Daily Group Progress Note  Program: CD-IOP   12/27/2016 Jimmy Davis [id number]  Diagnosis:  Methamphetamine use disorder, severe, dependence (HCC)  Major depressive disorder, recurrent episode, moderate (HCC)  Generalized anxiety disorder  Obsessive-compulsive behavior   Sobriety Date: 6/21  Group Time: 1-2:30  Participation Level: Active  Behavioral Response: Appropriate, Sharing, Rationalizing, Resistant and Evasive  Type of Therapy: Process Group  Interventions: CBT and Motivational Interviewing  Topic: Patients were active and engaged in session. They participated openly in discussion related to their recovery, struggles, and success over the past 24 hours. Pts were also led in a topical discussion on various topics related to addiction, 12 steps, and hope. One pt graduated successfully.     Group Time: 2:30-4  Participation Level: Active  Behavioral Response: Appropriate, Sharing, Rationalizing, Agitated and Evasive  Type of Therapy: Psycho-education Group  Interventions: CBT and Reframing  Topic: Patients were active and engaged in session. They participated openly in psychoeducation session about differentiating feelings vs. thoughts. Pts were taught about somatic, physiological symptoms of emotions and negative self talk. Pts appeared to encourage each other and make strong connections as to how their emotions and thoughts play into early recovery success.    Summary: Patient was active and engaged in session today. He denies attending any AA/NA meetings, which continues a trend of avoiding meetings since beginning tx here. He reported he "smoke a little bit then threw away all his paraphernalia and flushed his remaining drugs". Pt continues to sleep excessively and find "little else to do". He spoke on the topic of "guilt" and stated this is a common feeling for him since he "has done many things he is guilty of in his active addiction". Pt appears  severely ambivalent about total sobriety. Counselor mentioned pt may need to go inpatient tx for a few weeks and return to CD-IOP after. Pt agreed and said he is "open to residential tx and he may need it". Pt discussed his upcoming individual session with his mother and two counselings which is planned for tomorrow. He admitted he does not want to come. Counselors worked on his resistance and helped him understand the importance of attending. Pt agreed he would come and work on a plan for keeping sober this weekend. Pt was encouraged to consider living with his mother for a month while he focuses on sobriety.    UDS collected: No Results: Self Reported used Crystal Meth yesterday evening (6/20)  AA/NA attended?: No  Sponsor?: No   Wes Tanita Palinkas LPCA, LCASA 12/27/2016 9:57 AM

## 2016-12-27 NOTE — Progress Notes (Signed)
    Daily Group Progress Note  Program: CD-IOP   12/27/2016 Eilene Ghazi [id number]  Diagnosis:  No diagnosis found.   Sobriety Date: 12/26/16  Group Time: 1-2:30pm  Participation Level: Active  Behavioral Response: Sharing and Rationalizing  Type of Therapy: Process Group  Interventions: Supportive  Topic: Process: The first half of group was spent in process. Members shared about the successes and challenges or speed bumps they have experienced in early recovery. One member admitted he had relapsed and went to the ED. he was discharged early this morning and went right back home and used again. The group discussed this relapse and offered suggestions about what he might change. Another member disclosed the struggles he had on his first day back at work. His cravings were very intense and he was uncertain whether he should even return. Random drug tests were collected from patients who were absent on Monday.   Group Time: 2:30-4pm  Participation Level: Active  Behavioral Response: Appropriate  Type of Therapy: Psycho-education Group  Interventions: Strength-based  Topic: Psycho-ED: Chaplain. Dealing with Feelings in early recovery. The second half of group was spent in a psycho-ed on feelings. A chaplain visits monthly with the group and he led a conversation about feelings and the difficulties that accompany early recovery, when one begins to feel again. The conversation was lively with everyone actively sharing. The medical director met with three group members today, including one who will be graduating tomorrow.   Summary: The patient checked-in with a new sobriety date of tomorrow. He shared that he had gone to the ED last night because he felt ill and was scared. They discharged him early this morning and he admitted that he had gone home and used prior to going to bed. He denied being too high to participate and explained he was cooing down. His fellow group members  opted that he be allowed to remain. The patient reported he had phoned his mother last night and 'felt an intense need to become honest with her". The patient admitted he is feeling a lot of judgement and guilty, not necessarily from the group, but his own critical self. In the psycho-ed, the patient shared that 'I want my life back'. He reported that he had been suing crystal met/h for almost nine years and he would like to regain 'my happiness'. The patient admitted that because of his drug use he has not done the work on 'my emotional self' the eventually leads back to his drug use. The patient was very candid about himself and his life and despite his recent use, responded well to this intervention.    UDS collected: No Results:   AA/NA attended?: No  Sponsor?: No   Brandon Melnick, LCAS 12/27/2016 12:55 PM

## 2016-12-30 ENCOUNTER — Encounter: Payer: Self-pay | Admitting: Medical

## 2016-12-30 ENCOUNTER — Other Ambulatory Visit (HOSPITAL_BASED_OUTPATIENT_CLINIC_OR_DEPARTMENT_OTHER): Payer: Self-pay | Admitting: Psychology

## 2016-12-30 ENCOUNTER — Encounter (HOSPITAL_COMMUNITY): Payer: Self-pay | Admitting: Licensed Clinical Social Worker

## 2016-12-30 DIAGNOSIS — F1520 Other stimulant dependence, uncomplicated: Secondary | ICD-10-CM

## 2016-12-30 DIAGNOSIS — F331 Major depressive disorder, recurrent, moderate: Secondary | ICD-10-CM

## 2016-12-30 NOTE — Progress Notes (Signed)
CD-IOP FAMILY SESSION  THERAPIST PROGRESS NOTE  Session Time: 10-11am  Participation Level: Minimal  Behavioral Response: Casual and DisheveledAlertAnxious and Euthymic  Type of Therapy: Family Therapy  Treatment Goals addressed: Anxiety and Coping  Interventions: CBT and Motivational Interviewing  Summary: Jimmy SoxJonathan Davis is a 36 y.o. male who presents with Methamphetamine Use Disorder, generalized anxiety, and depression. He appears for session today, somewhat hesitant since "he has had family meetings in the past that did not go well". Counselors meet with pt and his mother who is his only financial, emotional, and family support structure at the time. He admits he feels guilty for putting his mother through difficult financial situations since she pays all his expenses. Counselors work with mother and pt to develop a plan for pt to avoid drug use this weekend. Pt agrees to leave session and travel to his mother's house for the weekend. He agrees to attend 2 AA meetings and call counselor to leave voicemail on Saturday and Sunday. Pt admits he is ambivalent and nervous about having so much accountability and also admits he wants to change. Counselor help pt differentiate between his "authentic voice and his addicted voice". Mother is supportive and congruent during session. She appears concerned and is focused on "helping pt learn to live on his own".   Suicidal/Homicidal: Nowithout intent/plan  Therapist Response: Counselors used open questions, honest emotional reflection, and genuine positive regard to help pt get more comfortable with changing his behaviors. Counselors emphasize the importance of changing his addictive patterns which involve moving out of his apartment, stopping the isolating behavior, and doing more physically rewarding activities other than video games and sleeping.   Plan: Return again in 1 weeks.  Diagnosis:    ICD-10-CM   1. Methamphetamine use disorder,  severe, dependence (HCC) F15.20   2. Major depressive disorder, recurrent episode, moderate (HCC) F33.1   3. Generalized anxiety disorder F41.1   4. Obsessive-compulsive behavior R46.81       Margo CommonWesley E Jourdin Connors, LPCA 12/30/2016

## 2017-01-01 ENCOUNTER — Encounter (HOSPITAL_COMMUNITY): Payer: Self-pay | Admitting: Medical

## 2017-01-01 ENCOUNTER — Other Ambulatory Visit (HOSPITAL_BASED_OUTPATIENT_CLINIC_OR_DEPARTMENT_OTHER): Payer: Self-pay | Admitting: Psychology

## 2017-01-01 ENCOUNTER — Encounter (HOSPITAL_COMMUNITY): Payer: Self-pay | Admitting: Psychology

## 2017-01-01 DIAGNOSIS — F331 Major depressive disorder, recurrent, moderate: Secondary | ICD-10-CM

## 2017-01-01 DIAGNOSIS — F1593 Other stimulant use, unspecified with withdrawal: Secondary | ICD-10-CM

## 2017-01-01 DIAGNOSIS — B20 Human immunodeficiency virus [HIV] disease: Secondary | ICD-10-CM

## 2017-01-01 DIAGNOSIS — F1523 Other stimulant dependence with withdrawal: Secondary | ICD-10-CM

## 2017-01-01 DIAGNOSIS — F1520 Other stimulant dependence, uncomplicated: Secondary | ICD-10-CM

## 2017-01-01 MED ORDER — BACLOFEN 20 MG PO TABS: 20.0000 mg | ORAL_TABLET | Freq: Three times a day (TID) | ORAL | 1 refills | Status: DC

## 2017-01-01 MED ORDER — VORTIOXETINE HBR 10 MG PO TABS: ORAL_TABLET | ORAL | 1 refills | Status: DC

## 2017-01-01 NOTE — Progress Notes (Signed)
BH MD/PA/NP OP Progress Note  01/01/2017 2:53 PM Jimmy Davis  MRN:  [id number]  Chief Complaint:  Chief Complaint    Follow-up; Addiction Problem     Subjective:  "I was tired and  felt I needed the energy (meth provides)" HPI: Jimmy Davis is 36 y/o WM admitted to the [facility] Program 12/11/2016 for severe dependence on Methamphetamine . He was started on Baclofen 10 mg for cravings but recently came in contact with some of the drug-a direct trigger he wasnt capable of "Just saying no"to. He admits he stopped taking his Baclofen as well because it mightb interfer ewith effect of meth he was seeking. Pt also c/o lack of efficacy from Prozac for his Depression.and wondering if he could try another antidepressant.Over the time he has beeen in [facility] his PHQ9 scores have been increased from 9 to 16.He has had a number of antidepressants .Says mostly switched because they stopped working or he quit taking. With the exception of Wellbutrin last yr which was associted with auditory hallucinatios.This was complicated by meth withdrawal but Wellbutrin was discontinued and he took Geodon for a while.  Visit Diagnosis:    ICD-10-CM   1. 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 Major depressive disorder, recurrent episode, moderate (HCC) F33.1   3. 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 Anxiety   . Depression   . Human immunodeficiency virus (HIV) disease (HCC)   . 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 Substance abuse     Past  Surgical History:  Procedure Laterality Date  . WISDOM TOOTH EXTRACTION      Family Psychiatric History:   M-SAD  F-Alcoholism  MGM-SAD; MGF ?, PGF?, PGM? Sister 51-good health  Family History:  Family History  Problem Relation Age of Onset  . Heart disease Father   . Mental illness Maternal Grandmother     Social History:  Social History   Social History  . Marital status: Single    Spouse name: N/A  . Number of children: N/A  . Years of education: N/A   Occupational History  . Accounting [employer]   Social History Main Topics  . Smoking status: Never Smoker  . Smokeless tobacco: Never Used  . Alcohol use No  . Drug use: Yes    Frequency: 4.0 times per week    Types: Methamphetamines     Comment: 3-4 times a week, Pt reports over one week   . Sexual activity: Yes    Partners: Male    Birth control/ protection: None, Condom     Comment: declined condoms   Other Topics Concern  . 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 acetaminophen (TYLENOL) 500 MG tablet Take 1 tablet (500 mg total) by mouth every 6 (six) hours as needed for mild pain or moderate pain. 30 tablet 0  . aspirin 325 MG tablet Take 325 mg by mouth every 6 (six) hours as needed for mild pain.    . baclofen (LIORESAL) 20 MG tablet Take 1 tablet (20 mg total) by mouth 3 (three) times daily. 90 tablet 1  .  cetirizine (ZYRTEC) 10 MG tablet Take 1 tablet (10 mg total) by mouth daily. 30 tablet 3  . GENVOYA 150-150-200-10 MG TABS tablet TAKE 1 TABLET BY MOUTH DAILY WITH BREAKFAST 30 tablet 5  . Multiple Vitamin (MULTIVITAMIN) capsule Take 1 capsule by mouth daily. (Patient not taking: Reported on 09/19/2016)    . ondansetron (ZOFRAN ODT) 4 MG disintegrating tablet Take 1 tablet (4 mg total) by mouth every 8 (eight) hours as needed for nausea or vomiting. 20 tablet 0  . polyethylene glycol powder (GLYCOLAX/MIRALAX) powder Take 17 g by mouth daily. 255 g 0  . Sodium Chloride-Xylitol (XLEAR SINUS CARE SPRAY NA) Place 1 spray into the nose daily as needed (CONGESTION). Reported on 10/31/2015    . vortioxetine HBr (TRINTELLIX) 10 MG TABS Take 1 tablet daily x 7 days then increase to 2 tablets daily 60 tablet 1   No current facility-administered medications for this visit.     Neurologic: Headache: Negative Seizure: Negative Paresthesias: Negative  Musculoskeletal: Strength & Muscle Tone: within normal limits Gait & Station: normal Patient leans: N/A  Psychiatric Specialty Exam: Review of Systems  Constitutional: Positive for malaise/fatigue. Negative for chills, diaphoresis, fever and weight loss.  Musculoskeletal: Negative for back pain, falls, joint pain, myalgias and neck pain.  Neurological: Negative for dizziness, tingling, tremors, sensory change, speech change, focal weakness, seizures, loss of consciousness, weakness and headaches.  Psychiatric/Behavioral: Positive for depression and substance abuse. Negative for hallucinations, memory loss and suicidal ideas. The patient is nervous/anxious and has insomnia.     There were no vitals taken for this visit.There is no height or weight on file to calculate BMI.  General Appearance: Neat and Well Groomed  Eye Contact:  Good  Speech:  Clear and Coherent  Volume:  Normal  Mood:  Euthymic  Affect:  Congruent  Thought Process:  Coherent and  Descriptions of Associations: Intact  Orientation:  Full (Time, Place, and Person)  Thought Content: Logical, Delusions, Obsessions  Methamphetamine use related   Suicidal Thoughts:  No  Homicidal Thoughts:  No  Memory:  Negative  Judgement:  Impaired  Insight:  Lacking and seems to be beginning to learn how brain is effected by drug from previuos ed ucation/Pet Scans of adddicted brain  Psychomotor Activity:  Normal  Concentration:  Concentration: Good and Attention Span: Good  Recall:  Fair impaired when triggered to use  [employer] of Knowledge: Fair  Language: Negative  Akathisia:  Negative  Handed:  Right  AIMS (if indicated):  NA  Assets:  Desire for Improvement Resilience Social Support  ADL's:  Intact  Cognition: Impaired,  Moderate by substance addiction  Sleep:  Drug induced insomnia     Treatment Plan Summary: Continue with Plan from admission ;increase Baclofen to 20  mg dose as tolerated;DC Prozac-start Brintellix 10 mg taper Prozac if needed. FU 1 week Maryjean Morn, PA-C 01/01/2017, 2:53 PM   ADDENDUM;Team meeting -pt's living situation and involveemnt with methamphetamine were discussed. Pt's mother is involved as she has been paying for Apt for years now but can no longer afford to.Apt is a trigger and g high risk situation-pt advised he needs to relocate per discussion with Counselors.
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
## 2017-01-02 ENCOUNTER — Encounter (HOSPITAL_COMMUNITY): Payer: Self-pay | Admitting: Licensed Clinical Social Worker

## 2017-01-02 ENCOUNTER — Other Ambulatory Visit (HOSPITAL_COMMUNITY): Payer: Self-pay | Admitting: Psychology

## 2017-01-02 ENCOUNTER — Ambulatory Visit (INDEPENDENT_AMBULATORY_CARE_PROVIDER_SITE_OTHER): Payer: Self-pay | Admitting: Internal Medicine

## 2017-01-02 ENCOUNTER — Encounter: Payer: Self-pay | Admitting: Internal Medicine

## 2017-01-02 VITALS — BP 116/80 | HR 77 | Temp 98.4°F | Ht 69.0 in | Wt 226.0 lb

## 2017-01-02 DIAGNOSIS — F1520 Other stimulant dependence, uncomplicated: Secondary | ICD-10-CM

## 2017-01-02 DIAGNOSIS — F324 Major depressive disorder, single episode, in partial remission: Secondary | ICD-10-CM

## 2017-01-02 DIAGNOSIS — F331 Major depressive disorder, recurrent, moderate: Secondary | ICD-10-CM

## 2017-01-02 DIAGNOSIS — B20 Human immunodeficiency virus [HIV] disease: Secondary | ICD-10-CM

## 2017-01-02 DIAGNOSIS — F411 Generalized anxiety disorder: Secondary | ICD-10-CM

## 2017-01-02 DIAGNOSIS — F1910 Other psychoactive substance abuse, uncomplicated: Secondary | ICD-10-CM

## 2017-01-02 DIAGNOSIS — E876 Hypokalemia: Secondary | ICD-10-CM

## 2017-01-02 NOTE — Progress Notes (Signed)
RFV: follow up for hiv disease and meth use Patient ID: Jimmy Davis, male   DOB: Sep 24, 1980, 36 y.o.   MRN: [id number]  HPI  Jimmy Davis is a 36yo M with well controlled hiv disease ,CD 4 count of 580/VL<20 on genvoya who has severe depression and meth use. [facility] 3 x per week, currently on his 4th wk. He feels he is starting to notice slight improvement. He last used meth 1 week ago but had to go to ED due to severe diarrhea/n/v which he attributed to drug use but he also had fever/chills so possibly had gastroenteritis. He has now improved/resolution of symptoms  Has counseling today  Outpatient Encounter Prescriptions as of 01/02/2017  Medication Sig  . acetaminophen (TYLENOL) 500 MG tablet Take 1 tablet (500 mg total) by mouth every 6 (six) hours as needed for mild pain or moderate pain.  Marland Kitchen. aspirin 325 MG tablet Take 325 mg by mouth every 6 (six) hours as needed for mild pain.  . baclofen (LIORESAL) 20 MG tablet Take 1 tablet (20 mg total) by mouth 3 (three) times daily.  . cetirizine (ZYRTEC) 10 MG tablet Take 1 tablet (10 mg total) by mouth daily.  Marland Kitchen. FLUoxetine (PROZAC) 40 MG capsule Take 80 mg by mouth daily. Stay on prozac if trintillex is not covered.  . GENVOYA 150-150-200-10 MG TABS tablet TAKE 1 TABLET BY MOUTH DAILY WITH BREAKFAST  . Sodium Chloride-Xylitol (XLEAR SINUS CARE SPRAY NA) Place 1 spray into the nose daily as needed (CONGESTION). Reported on 10/31/2015  . Multiple Vitamin (MULTIVITAMIN) capsule Take 1 capsule by mouth daily. (Patient not taking: Reported on 09/19/2016)  . ondansetron (ZOFRAN ODT) 4 MG disintegrating tablet Take 1 tablet (4 mg total) by mouth every 8 (eight) hours as needed for nausea or vomiting. (Patient not taking: Reported on 01/02/2017)  . vortioxetine HBr (TRINTELLIX) 10 MG TABS Take 1 tablet daily x 7 days then increase to 2 tablets daily (Patient not taking: Reported on 01/02/2017)  .  [DISCONTINUED] polyethylene glycol powder (GLYCOLAX/MIRALAX) powder Take 17 g by mouth daily.   No facility-administered encounter medications on file as of 01/02/2017.      Patient Active Problem List   Diagnosis Date Noted  . Obsessive-compulsive behavior 12/13/2016  . Anxiety 12/11/2016  . Methamphetamine use disorder, severe, dependence (HCC) 12/09/2016  . Major depressive disorder, recurrent episode, moderate (HCC) 11/17/2015  . 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 Depression 10/03/2014  . Human immunodeficiency virus (HIV) disease (HCC) 06/29/2013     Health Maintenance Due  Topic Date Due  . TETANUS/TDAP  07/02/1998     Review of Systems 12 point ros is negative except for depression/anxiety.  Physical Exam   BP 116/80   Pulse 77   Temp 98.4 F (36.9 C) (Oral)   Ht 5\' 9"  (1.753 m)   Wt 226 lb (102.5 kg)   BMI 33.37 kg/m   Physical Exam  Constitutional: He is oriented to person, place, and time. He appears well-developed and well-nourished. No distress.  HENT:  Mouth/Throat: Oropharynx is clear and moist. No oropharyngeal exudate.  Cardiovascular: Normal rate, regular rhythm and normal heart sounds. Exam reveals no gallop and no friction rub.  No murmur heard.  Pulmonary/Chest: Effort normal and breath sounds normal. No respiratory distress. He has no wheezes.  Abdominal: Soft. Bowel sounds are normal. He exhibits no distension. There is no tenderness.  Lymphadenopathy:  He has no cervical  adenopathy.  Neurological: He is alert and oriented to person, place, and time. +akathesia Skin: Skin is warm and dry. No rash noted. No erythema.  Psychiatric: He has a normal mood and affect. His behavior is normal.    Lab Results  Component Value Date   CD4TCELL 21 (L) 09/12/2016   Lab Results  Component Value Date   CD4TABS 580 09/12/2016   CD4TABS 520 04/18/2016   CD4TABS 730 09/19/2015   Lab Results  Component Value Date   HIV1RNAQUANT <20 NOT DETECTED  09/12/2016   Lab Results  Component Value Date   HEPBSAB NEG 08/17/2013   Lab Results  Component Value Date   LABRPR NON REAC 04/18/2016    CBC Lab Results  Component Value Date   WBC 8.1 12/25/2016   RBC 4.53 12/25/2016   HGB 13.7 12/25/2016   HCT 38.0 (L) 12/25/2016   PLT 236 12/25/2016   MCV 83.9 12/25/2016   MCH 30.2 12/25/2016   MCHC 36.1 (H) 12/25/2016   RDW 12.3 12/25/2016   LYMPHSABS 2,040 04/18/2016   MONOABS 408 04/18/2016   EOSABS 153 04/18/2016    BMET Lab Results  Component Value Date   NA 133 (L) 12/25/2016   K 2.8 (L) 12/25/2016   CL 100 (L) 12/25/2016   CO2 22 12/25/2016   GLUCOSE 116 (H) 12/25/2016   BUN 9 12/25/2016   CREATININE 1.08 12/25/2016   CALCIUM 8.9 12/25/2016   GFRNONAA >60 12/25/2016   GFRAA >60 12/25/2016      Assessment and Plan Hypokalemia = we will check labs today to see that his hypokalemia is improved  hiv disease = well controlled  Depression = continue with meds and counselng  Substance abuse = has 7 d sobriety commended him to keep doing good job

## 2017-01-02 NOTE — Patient Instructions (Signed)
Come in 2 wk prior to get lab work

## 2017-01-02 NOTE — Progress Notes (Signed)
CD-IOP INDIVIDUAL SESSION  THERAPIST PROGRESS NOTE  Session Time: 4-4:50pm  Participation Level: Active  Behavioral Response: CasualAlertEuthymic  Type of Therapy: Individual Therapy  Treatment Goals addressed: Diagnosis: SUD  Interventions: CBT and Motivational Interviewing  Summary: Jimmy Davis is a 36 y.o. male who presents with anxious mood, hx of methamphetamine addiction and depression. He reports he is sober since 6/22 and has been enjoying spending time at his mothers' house instead of his apartment alone. Pt and counselor explore "affirmations for adult self'. Pt identifies his core belief that he is unattractive and defective. Pt discusses his ideals of attractiveness. Pt states he wants to stay with his mother but he misses being able to look at pornography in private at his own apartment. Pt and counselor discuss pt plan to stay sober one day at a time.   Suicidal/Homicidal: Nowithout intent/plan  Therapist Response: Counselor used MI to build pt "change talk". Counselor discussed pros/cons of addiction versus sobriety. Counselor asked pt open questions about his 2 years of sobriety. Pt was unable to identify what kept him sober and stated he only "avoided the drug" since he did not know anyone to get it from. Counselor encouraged pt to identify the supportive reasons he did not use meth. Counselor then focused on solution focused planning to stay sober this weekend. Counselor and pt planned specific activities for pt to engage in to support his recovery.   Plan: Return again in 1 weeks.  Diagnosis:    ICD-10-CM   1. 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 Major depressive disorder, recurrent episode, moderate (HCC) F33.1   3. Generalized anxiety disorder F41.1        Margo CommonWesley E Swan, LPCA 01/02/2017

## 2017-01-03 ENCOUNTER — Encounter (HOSPITAL_COMMUNITY): Payer: Self-pay | Admitting: Psychology

## 2017-01-03 LAB — BASIC METABOLIC PANEL
BUN: 12 mg/dL (ref 7–25)
CO2: 25 mmol/L (ref 20–31)
Calcium: 9.6 mg/dL (ref 8.6–10.3)
Chloride: 102 mmol/L (ref 98–110)
Creat: 0.94 mg/dL (ref 0.60–1.35)
GLUCOSE: 71 mg/dL (ref 65–99)
POTASSIUM: 4.3 mmol/L (ref 3.5–5.3)
SODIUM: 137 mmol/L (ref 135–146)

## 2017-01-03 LAB — MAGNESIUM: Magnesium: 2.1 mg/dL (ref 1.5–2.5)
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
## 2017-01-03 NOTE — Addendum Note (Signed)
Addended by: Dimas ChyleSWAN,  E on: 01/03/2017 10:45 AM   Modules accepted: Level of Service

## 2017-01-06 ENCOUNTER — Encounter: Payer: Self-pay | Admitting: Medical

## 2017-01-06 ENCOUNTER — Encounter (HOSPITAL_COMMUNITY): Payer: Self-pay | Admitting: Psychology

## 2017-01-06 ENCOUNTER — Other Ambulatory Visit (HOSPITAL_COMMUNITY): Payer: Self-pay | Attending: Psychiatry | Admitting: Psychology

## 2017-01-06 DIAGNOSIS — F1520 Other stimulant dependence, uncomplicated: Secondary | ICD-10-CM

## 2017-01-06 DIAGNOSIS — F331 Major depressive disorder, recurrent, moderate: Secondary | ICD-10-CM

## 2017-01-06 NOTE — Progress Notes (Signed)
    Daily Group Progress Note  Program: CD-IOP   01/06/2017 Eilene Ghazi [id number]  Diagnosis:  No diagnosis found.   Sobriety Date: 12/27/16  Group Time: 1-2:30pm  Participation Level: Active  Behavioral Response: Appropriate and Sharing  Type of Therapy: Process Group  Interventions: Supportive  Topic: Process: the first half of group was spent in process. Members shared about any issues or problems they are facing in early recovery. They discussed bright or 'shining moments' as well as challenges or 'speed bumps'. The importance of planning for the upcoming holiday next week was reiterated throughout group today.  Group Time: 2:30-4pm  Participation Level: Active  Behavioral Response: Appropriate  Type of Therapy: Psycho-education Group  Interventions: Strength-based  Topic: Psycho-Ed: Stages of development, Part 2 and Affirmations. The second half of group was spent in a psycho-ed closing the psycho-ed from yesterday that identified the different developmental stages of childhood up to adulthood. A handout was passed around that identified "Affirmations for Rebonding". These affirmations provide what an adult might need now, based on what he/she did not receive at the appropriate time in their childhood. Members went over the handout together and identified those affirmations that resonated with them today. The assignment for the weekend was for each member to practice saying or reading an affirmation at least once every day. The group will discuss this during the next time we meet, on Monday, July 2.   Summary: The patient reported he was a little nervous. He realizes that he has now been drug-free for a week and his typical pattern is to use for a week and then rest for a wee. And then start all over again. The patient reported he had met with his counselor this morning and had about 90 minutes. He admitted he had thought about going to his apartment to see if there  had been a 'package' delivered. His shining moment was that he did not go. The patient admitted he needed to keep remembering what the program director had told him. He could anticipate cravings and should be prepared to address them and not let them take over. The patient reported his mother's 76th birthday was tomorrow, and she was going to her church to watch a film called, "I can only Imagine". Another group member encouraged him to go and noted that it is a very powerful film. In the psycho-ed, the patient was able t o identify with a fellow group member and explained he has felt as if 'something is wrong with me' on a day to day basis. He agreed that there is the sense that he is 'flawed'. He was encouraged to find one of the affirmations and say it a few times every day between today and when he returns to group on Monday. The patient responded well to this intervention, but he will be challenged in the days ahead as his disease begins calling to him. He did not attend any meetings.   UDS collected: Yes Results: negative  AA/NA attended?: No  Sponsor?: No   Brandon Melnick, LCAS 01/06/2017 4:31 PM

## 2017-01-07 ENCOUNTER — Encounter (HOSPITAL_COMMUNITY): Payer: Self-pay | Admitting: Psychology

## 2017-01-07 NOTE — Progress Notes (Signed)
Daily Group Progress Note  Program: CD-IOP   01/07/2017 Eilene Ghazi [id number]  Diagnosis:  No diagnosis found.   Sobriety Date: 12/26/16  Group Time: 1-2:30pm  Participation Level: Active  Behavioral Response: Appropriate and Sharing  Type of Therapy: Process Group  Interventions: Supportive  Topic: Process/Chair Yoga:  The first half of group was spent in process. Members shared about the past weekend and identified the shining moments and speed bumps in early recovery. A new group member was present today and he introduced himself and described a long history of alcoholic drinking. During process, this new patient met with the Investment banker, operational. For forty minutes, the group was led in a chair yoga by a counselor and visiting facilitator. It was gentle and everyone engaged to the best of their abilities. At the conclusion, everyone agreed that the exercise had made him or her feel more relaxed.   Group Time: 2:30-4pm  Participation Level: Active  Behavioral Response: Sharing  Type of Therapy: Psycho-education Group  Interventions: Strength-based  Topic: Psycho-Ed: The Cycle of Addictive Thinking and Use. The second half of group was spent in a psycho-education session on relapse Prevention. The cycle of addiction was discussed at length. Members were asked to identify what they might do to challenge the thinking, which is the first part, and/or the second part, which is the habitual patterns of preparation, the rituals, which occur prior to use. Disrupting the thinking and changing those old patterns is what one must do to avoid getting caught into them and dragged onto the actual use. Members complained about the difficulty or inconvenience of even getting out of the other side of the bed. Their reluctance and resistance to change was surprising. Prior to the group ending. Members shared about the upcoming holiday and what their plans were for July 4. The group will  return on Thursday, July 5.   Summary: The patient reported he had attended the movie showing at his mother's church on Friday. His sister had met them there. It was their mother's birthday so they enjoyed the time together. His shining moment was attending the evening showing with his mother because it seemed to make her happy. But his speed bump was having called his dealer to find out about any shipments that might be coming in soon. The patient admitted he feels pulled in two directions - one towards sobriety and the other his active addiction. The counselor validated this feeling and noted that his work will be to do everything he can to strengthen the power and voice of his sober self and defeat the pull by his addiction. It really is a battle of sorts. His question for the group was how to deal with cravings? The patient enjoyed the chair yoga and displayed a significant degree of flexibility not displayed by other members. He reported he had forgotten about the affirmation assignment until this morning, but apologized for not having practiced saying some of the affirmations on the handout. In the psycho-ed, the patient was able to identify some strategies he could take to avoid letting the thoughts jump start his rituals. These included calling someone, including his mother and not isolating, as he is so prone to do. It was agreed that this patient must get to more meetings and make some friends that are sober. He remains drug-free and is staying at his mother's house, but he must expand his support network or he will likely return to active drug use. He responded well to  this intervention.   UDS collected: Yes Results: pending  AA/NA attended?: YesThursday and Saturday  Sponsor?: No   Brandon Melnick, LCAS 01/07/2017 2:40 PM

## 2017-01-09 ENCOUNTER — Other Ambulatory Visit (HOSPITAL_COMMUNITY): Payer: Self-pay | Attending: Psychiatry | Admitting: Psychology

## 2017-01-09 ENCOUNTER — Encounter (HOSPITAL_COMMUNITY): Payer: Self-pay | Admitting: Licensed Clinical Social Worker

## 2017-01-09 ENCOUNTER — Encounter: Payer: Self-pay | Admitting: Medical

## 2017-01-09 DIAGNOSIS — F1520 Other stimulant dependence, uncomplicated: Secondary | ICD-10-CM

## 2017-01-09 DIAGNOSIS — F411 Generalized anxiety disorder: Secondary | ICD-10-CM

## 2017-01-09 DIAGNOSIS — Z79899 Other long term (current) drug therapy: Secondary | ICD-10-CM | POA: Insufficient documentation

## 2017-01-09 DIAGNOSIS — F331 Major depressive disorder, recurrent, moderate: Secondary | ICD-10-CM

## 2017-01-09 DIAGNOSIS — F329 Major depressive disorder, single episode, unspecified: Secondary | ICD-10-CM | POA: Insufficient documentation

## 2017-01-10 ENCOUNTER — Encounter (HOSPITAL_COMMUNITY): Payer: Self-pay | Admitting: Licensed Clinical Social Worker

## 2017-01-10 ENCOUNTER — Encounter (HOSPITAL_COMMUNITY): Payer: Self-pay | Admitting: Psychology

## 2017-01-10 NOTE — Progress Notes (Signed)
CD-IOP INDIVIDUAL SESSION  THERAPIST PROGRESS NOTE  Session Time: 12-12:45pm  Participation Level: Active  Behavioral Response: CasualAlertEuthymic  Type of Therapy: Individual Therapy  Treatment Goals addressed: Diagnosis: SUD  Interventions: CBT and Supportive  Summary: Jimmy Davis is a 36 y.o. male who presents with addiction to Methamphetamine. He is dressed casually and his hygiene appears adequate. He states he has been feeling frustrated this morning since his mother is not allowing him to stay in his apartment. Pt admitted he is angry at his mother since she did not consult with him before deciding with the counselors to require him to leave his apartment. Counselor and pt discuss the two sides of pt- "his addicted self and his recovery self". Pt is able to identify which thoughts are harmful and are the addicted self talking. Pt expressed his frustration at counselor though he struggled since "it sounded so harsh to criticize you in person". Counselor validated pt frustration and reflected that pt does not appear harsh but rather is expressing himself calmly and honestly. Pt smiled and stated he was glad. Counselor and pt strategized about pt asking for help with this issue in the upcoming group later this afternoon. Pt stated he would "consider it".   Suicidal/Homicidal: Nowithout intent/plan  Therapist Response: Pt is showing increased honesty, immediacy, and emotional understanding. He appears to be growing his motivation to stop drug use. He is less agitated and fidgety in session and his facial color is improving.   Plan: Return again in 1 weeks.  Diagnosis:    ICD-10-CM   1. 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 Major depressive disorder, recurrent episode, moderate (HCC) F33.1   3. Generalized anxiety disorder F41.1        Margo CommonWesley E Brittley Regner, LPCA 01/10/2017

## 2017-01-10 NOTE — Progress Notes (Signed)
    Daily Group Progress Note  Program: CD-IOP   01/10/2017 Jimmy SoxJonathan Davis [id number]  Diagnosis:  Methamphetamine use disorder, severe, dependence (HCC)  Major depressive disorder, recurrent episode, moderate (HCC)  Generalized anxiety disorder   Sobriety Date: 6/21  Group Time: 1-2:30  Participation Level: Active  Behavioral Response: Appropriate and Sharing  Type of Therapy: Process Group  Interventions: CBT  Topic: Patients were active and engaged for 1.5 hour group process session. Counselors led discussion on pts' recovery, challenges, supports, and utilized skills outside of sessions. One pt was new today and was very active and open in sharing. Drug tests were collected from some pts. Pts shared about their recent July fourth holiday and not meeting for group yesterday. Counselor also led discussion utilizing topical recovery-based themes.      Group Time: 2:30-4  Participation Level: Active  Behavioral Response: Appropriate and Sharing  Type of Therapy: Psycho-education Group  Interventions: CBT and Psychosocial Skills: daily self inventory  Topic: Patients were active and engaged for 1.5 hour psychoeducation session. Counselors led lesson on "taking a daily self inventory" and provided pts with handout. Pts were instructed to fill out self inventory for the weekend as homework. Counselors then led activity titled "Three Circles" which helps pts identify behavioral and external triggers. Pts shared openly and stated that session was helpful.   Summary: Patient was active and engaged in session. He reported he attended 1 AA meeting since our last group. He had a relaxing and sober fourth of July. He reported he is starting to see old AA friends at his meetings which makes him feel supported and encouraged. He admitted she was feeling resentful and angry towards his mother and towards counselors since "you all told my mom to evict me from my apartment".  Counselors helped pt identify his feelings and process his irrational thoughts related to his addictive behavior. Pt agreed he needed to leave his apartment if he wants to stop using meth. Pt admitted his thoughts to use are decreasing though he realizes that his meth use is linked to high stress.    UDS collected: Yes Results: negative  AA/NA attended?: YesTuesday  Sponsor?: No   Wes Swan, LPCA, LCASA 01/10/2017 11:43 AM

## 2017-01-13 ENCOUNTER — Other Ambulatory Visit (HOSPITAL_COMMUNITY): Payer: Self-pay | Admitting: Psychology

## 2017-01-13 DIAGNOSIS — F1520 Other stimulant dependence, uncomplicated: Secondary | ICD-10-CM

## 2017-01-13 DIAGNOSIS — F331 Major depressive disorder, recurrent, moderate: Secondary | ICD-10-CM

## 2017-01-14 ENCOUNTER — Encounter (HOSPITAL_COMMUNITY): Payer: Self-pay | Admitting: Psychology

## 2017-01-14 NOTE — Progress Notes (Signed)
    Daily Group Progress Note  Program: CD-IOP   01/14/2017 Eilene Ghazi [id number]  Diagnosis:  No diagnosis found.   Sobriety Date: 12/26/16  Group Time: 1-2:30pm  Participation Level: Active  Behavioral Response: Appropriate and Sharing  Type of Therapy: Process Group  Interventions: Supportive  Topic: Process: the first half of group was spent in process. Members shared about the past weekend and any challenges (speedbumps) or successes (shining moments) they had in early recovery. The medical director met with the newest group member and completed a medication check with another. Random drug tests were collected from three group members.  Group Time: 2:30-4pm  Participation Level: Active  Behavioral Response: Sharing  Type of Therapy: Psycho-education Group  Interventions: Strength-based  Topic: Psycho-Education: Daily Inventory/Guided Meditation. A handout with a checklist of assets and liabilities was provided to group members last week. They were instructed to complete the handout on a daily basis. The findings of this daily assignment were shared in the psycho-ed. Members shared about how taking this daily inventory may have assisted them in their recovery. Two members admitted they had forgotten about the assignment and not completed the inventory.  Near the end of the session, the counselor read a guided meditation. Members provided feedback upon completing the meditation. Everyone present stated it had been soothing and relaxing. Members were reminded to complete the inventory and bring them on Wednesday.   Summary: The patient reported he had met with his counselor on Friday morning here at [facility]. When he was driving back to [place] afterwards, his dealer called him and asked if they could make a shipment for Saturday? He admitted it was very difficult, but the patient said, "No, I won't be there". The patient stated his shining moment was that he phoned his  counselor immediately after the conversation and they talked through it. The group applauded his report. The patient reported he had gone back to his mother's home, but on Saturday, he was thinking a lot about the drug dealer and became angry with himself for having not approved the shipment. The patient asked his mother if she would be mad if he went home to his apartment for a few days? She stated that she would be upset and that his counselors in the program would be angry with him. The patient reported, "I felt like a child". He agreed that he had never really had his mother be so seemingly aggressive towards him. The patient reported he does not think as much about using as he had ben and credited the Baclofen with helping reduce the thoughts and cravings. The patient reported he had completed the checklist assignment and it was interesting. He liked having to identify assets as well as liabilities. The patient reported that he is recognizing 'lots of honesty', which are good patterns he is developing. It feels good and "I am proud". The patient found the mediation very relaxing. The patient experienced another drug-free weekend and responded well to this intervention.    UDS collected: Yes Results: pending  AA/NA attended?: YesFriday and Saturday  Sponsor?: No   Brandon Melnick, LCAS 01/14/2017 11:01 AM

## 2017-01-15 ENCOUNTER — Other Ambulatory Visit (HOSPITAL_COMMUNITY): Payer: Self-pay | Admitting: Psychology

## 2017-01-15 DIAGNOSIS — F331 Major depressive disorder, recurrent, moderate: Secondary | ICD-10-CM

## 2017-01-15 DIAGNOSIS — F1520 Other stimulant dependence, uncomplicated: Secondary | ICD-10-CM

## 2017-01-16 ENCOUNTER — Encounter (HOSPITAL_COMMUNITY): Payer: Self-pay | Admitting: Licensed Clinical Social Worker

## 2017-01-16 ENCOUNTER — Other Ambulatory Visit (HOSPITAL_COMMUNITY): Payer: Self-pay | Admitting: Psychology

## 2017-01-16 ENCOUNTER — Encounter (HOSPITAL_COMMUNITY): Payer: Self-pay | Admitting: Psychology

## 2017-01-16 DIAGNOSIS — F331 Major depressive disorder, recurrent, moderate: Secondary | ICD-10-CM

## 2017-01-16 DIAGNOSIS — F1520 Other stimulant dependence, uncomplicated: Secondary | ICD-10-CM

## 2017-01-16 DIAGNOSIS — F411 Generalized anxiety disorder: Secondary | ICD-10-CM
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
## 2017-01-17 ENCOUNTER — Encounter (HOSPITAL_COMMUNITY): Payer: Self-pay | Admitting: Psychology

## 2017-01-17 ENCOUNTER — Encounter (HOSPITAL_COMMUNITY): Payer: Self-pay | Admitting: Licensed Clinical Social Worker

## 2017-01-17 NOTE — Progress Notes (Signed)
CD-IOP INDIVIDUAL TX REVIEW SESSION  THERAPIST PROGRESS NOTE  Session Time: 4:10-5pm  Participation Level: Active  Behavioral Response: Casual and Fairly GroomedAlertEuthymic  Type of Therapy: Individual Therapy  Treatment Goals addressed: Diagnosis: SUD, GAD  Interventions: CBT, Strength-based, Supportive and Reframing  Summary: Jimmy Davis is a 36 y.o. male who presents for weekly individual therapy as part of CD-IOP to address his addiction to Meth and GAD. Pt reports on his progress in tx and reevaluates goals in collaboration w/ counselor. A tx plan update is signed and sent for scanning. Pt admitted he struggled with sobriety early on in tx but has since learned more coping skills such as distracting himself and attending NA meetings. Pt and counselor discuss pt need to share more in AA meetings and begin to meet people in recovery. Pt admitted he has not focused on his goal of editing his resume or applying for jobs and this goal was revised for 1 month from now. Pt admitted the goals is still important since "getting money to eventually pay for my own place is a huge priority for me right now". Pt and counselor discussed pt's improved communication skills and past romantic relationships to work on his 3rd goal in tx.   Suicidal/Homicidal: Nowithout intent/plan  Therapist Response: Counselor used open questions, solution-focused questions, and reflection to help pt plan and work his early recovery. Pt is responding more immediately in session and appears to be increasing his comfort with counselor and therapy process.   Plan: Return again in 1 weeks.  Diagnosis:    ICD-10-CM   1. 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 Major depressive disorder, recurrent episode, moderate (HCC) F33.1   3. Generalized anxiety disorder F41.1        Margo CommonWesley E Swan, LPCA 01/17/2017

## 2017-01-17 NOTE — Progress Notes (Signed)
    Daily Group Progress Note  Program: CD-IOP   01/17/2017 Jimmy Davis [id number]  Diagnosis:  Methamphetamine use disorder, severe, dependence (HCC)  Major depressive disorder, recurrent episode, moderate (HCC)  Generalized anxiety disorder   Sobriety Date: 6/21  Group Time: 1-2:30  Participation Level: Active  Behavioral Response: Appropriate and Sharing  Type of Therapy: Process Group  Interventions: CBT, Strength-based, Supportive and Reframing  Topic: Pt participated openly and shared actively about their struggles and successes in early recovery. UDS were collected from some pts. 2 Group members shared about their time since missing last session. Pts discussed topics about recovery which fostered insight and understanding of early sobriety.      Group Time: 2:30-4  Participation Level: Active  Behavioral Response: Appropriate and Sharing  Type of Therapy: Psycho-education Group  Interventions: CBT and Psychosocial Skills: Refusal Skills  Topic: Pts participated openly and shared actively in psychoeducation session about communication. Counselors discussed pt's refusal skills and had pts practice skills in session. Counselors did a recap of last group session to emphasize the importance of healthy assertiveness in communication.    Summary: Pt presented as somewhat tired. He reported he had some disturbing dreams which he was concerned about. Counselor discussed PAWS symptoms and how bad dreams are indicative of a healing brain and to continue to monitor them. Pt reported he did not attend AA since last group. He stated he was interested in establishing a home group in the coming weeks. He was hesitant with his ability to voice his refusal skills but showed marked increase in effectiveness after session. He asked the group for help in becoming more assertive with his communication. He was able to articulate the 4 types of communication from yesterday's  lesson. He appears to be sustaining sobriety though admits he has not changed his phone number, leaving him open to being triggered. Pt is still ambivalent but appears to be strengthening his recovery daily.    UDS collected: Yes Results: pending  AA/NA attended?: No  Sponsor?: No   Wes Swan, LPCA LCASA 01/17/2017 4:25 PM

## 2017-01-20 ENCOUNTER — Encounter: Payer: Self-pay | Admitting: Psychiatry

## 2017-01-20 ENCOUNTER — Other Ambulatory Visit (HOSPITAL_BASED_OUTPATIENT_CLINIC_OR_DEPARTMENT_OTHER): Payer: Self-pay | Admitting: Psychiatry

## 2017-01-20 DIAGNOSIS — F411 Generalized anxiety disorder: Secondary | ICD-10-CM

## 2017-01-20 DIAGNOSIS — F1520 Other stimulant dependence, uncomplicated: Secondary | ICD-10-CM

## 2017-01-20 DIAGNOSIS — F331 Major depressive disorder, recurrent, moderate: Secondary | ICD-10-CM

## 2017-01-22 ENCOUNTER — Other Ambulatory Visit (HOSPITAL_BASED_OUTPATIENT_CLINIC_OR_DEPARTMENT_OTHER): Payer: Self-pay | Admitting: Psychiatry

## 2017-01-22 DIAGNOSIS — F411 Generalized anxiety disorder: Secondary | ICD-10-CM

## 2017-01-22 DIAGNOSIS — F331 Major depressive disorder, recurrent, moderate: Secondary | ICD-10-CM

## 2017-01-22 DIAGNOSIS — F1520 Other stimulant dependence, uncomplicated: Secondary | ICD-10-CM

## 2017-01-23 ENCOUNTER — Encounter (HOSPITAL_COMMUNITY): Payer: Self-pay | Admitting: Psychiatry

## 2017-01-23 ENCOUNTER — Other Ambulatory Visit (HOSPITAL_BASED_OUTPATIENT_CLINIC_OR_DEPARTMENT_OTHER): Payer: Self-pay | Admitting: Psychiatry

## 2017-01-23 DIAGNOSIS — F411 Generalized anxiety disorder: Secondary | ICD-10-CM

## 2017-01-23 DIAGNOSIS — F1520 Other stimulant dependence, uncomplicated: Secondary | ICD-10-CM

## 2017-01-23 DIAGNOSIS — F331 Major depressive disorder, recurrent, moderate: Secondary | ICD-10-CM

## 2017-01-23 NOTE — Progress Notes (Signed)
    Daily Group Progress Note  Program: CD-IOP   01/23/2017 Eilene Ghazi [id number]  Diagnosis:  Methamphetamine use disorder, severe, dependence ([place])  Major depressive disorder, recurrent episode, moderate (HCC)  Generalized anxiety disorder   Sobriety Date: 6/21  Group Time: 1-2:30  Participation Level: Active  Behavioral Response: Appropriate and Sharing  Type of Therapy: Process Group  Interventions: CBT, Solution Focused, Strength-based and Supportive  Topic: Patients were active and engaged in 1.5 hour group process session. Counselor used open questions, reflection of emotion, and validation, and CBT to grown pts insight into their presenting concerns. Pts shared openly about their struggles and successes in early recovery. UDS were collected from some members. One new pt was present and shared about his recent release from tx for addiction. This new pt met with Darlyne Russian to establish care.      Group Time: 2:30-4  Participation Level: Active  Behavioral Response: Appropriate and Sharing  Type of Therapy: Psycho-education Group  Interventions: Other: psychopharmacology  Topic: Patients were active and engaged in 1.5 hour group psychoeducation session. Session was led by Einar Grad, Pharmacist. She discussed psychotropic medications and conducted an open question and answer time. Session appeared productive and pts expressed interest in subject. Topics included medications for cravings, ADHD, Benzos, Anti-depressants, and gene testing for effectiveness.    Summary: Pt was active and engaged in sessions. He presented as somewhat tired looking with dark circles under his eyes. He reported he had a positive weekend with no arguments with his mother. He reported his mother was given money from her church to cover his rent for July. When pt asked mother about how she felt about this, she reportedly said "it'll help" w/o passion or excitement which frustrated  pt. Pt admitted he feels a near chronic sense of guilt about his meth use and the consequences it has caused. Pt reported he attended 2 NA meetings and stated he is feeling more comfortable attending meetings now.    UDS collected: Yes Results: negative  AA/NA attended?: YesSaturday and Sunday  Sponsor?: No   Youlanda Roys, LPCA LCASA 01/23/2017 9:51 AM

## 2017-01-24 ENCOUNTER — Encounter (HOSPITAL_COMMUNITY): Payer: Self-pay | Admitting: Psychiatry

## 2017-01-24 NOTE — Progress Notes (Signed)
    Daily Group Progress Note  Program: CD-IOP   01/24/2017 Lovett SoxJonathan Odland [id number]  Diagnosis:  Methamphetamine use disorder, severe, dependence (HCC)  Major depressive disorder, recurrent episode, moderate (HCC)  Generalized anxiety disorder   Sobriety Date: 6/21  Group Time: 1-2:30  Participation Level: Active  Behavioral Response: Appropriate and Sharing  Type of Therapy: Process Group  Interventions: CBT, Strength-based, Supportive and Social Skills Training  Topic: Patients were active and engaged for 1.5 hr group process session. Patients shared about their successes and struggles in early recovery. UDS were collected from some members. One new member was present today for her first group and shared openly.      Group Time: 2:30-4  Participation Level: Active  Behavioral Response: Appropriate and Sharing  Type of Therapy: Psycho-education Group  Interventions: Other: Art Therapy  Topic: Patients were active and engaged for 1.5 hour group psychoeducation session. Counselor led art therapy exercise in which pts drew images of themselves in past, present, and future. Counselor led process session about drawings and asked pts to share their drawings w/ each member. Patients stated they enjoyed the exercise and felt it was therapeutic.    Summary: Patient was engaged in session and reported he attended 1 NA meeting since last group. He reported on becoming easily irritated at his mother but quickly realizing it and asking for forgiveness. He shared in his art piece that he "felt like a blob" and did not feel physically attractive which made him depressed. He continues to work on his goals of sobriety and building social support. He denies communicating with his old drug dealers since Monday.    UDS collected: No Results: negative  AA/NA attended?: YesWednesday  Sponsor?: No   Wes Swan, LPCA LCASA 01/24/2017 2:16 PM

## 2017-01-27 ENCOUNTER — Encounter: Payer: Self-pay | Admitting: Psychiatry

## 2017-01-27 ENCOUNTER — Encounter (HOSPITAL_COMMUNITY): Payer: Self-pay | Admitting: Medical

## 2017-01-27 ENCOUNTER — Other Ambulatory Visit (INDEPENDENT_AMBULATORY_CARE_PROVIDER_SITE_OTHER): Payer: Self-pay | Admitting: Psychology

## 2017-01-27 DIAGNOSIS — F1910 Other psychoactive substance abuse, uncomplicated: Secondary | ICD-10-CM

## 2017-01-27 DIAGNOSIS — F1520 Other stimulant dependence, uncomplicated: Secondary | ICD-10-CM

## 2017-01-27 DIAGNOSIS — R4681 Obsessive-compulsive behavior: Secondary | ICD-10-CM

## 2017-01-27 DIAGNOSIS — F331 Major depressive disorder, recurrent, moderate: Secondary | ICD-10-CM

## 2017-01-27 DIAGNOSIS — F411 Generalized anxiety disorder: Secondary | ICD-10-CM

## 2017-01-27 MED ORDER — BACLOFEN 20 MG PO TABS: 20.0000 mg | ORAL_TABLET | Freq: Four times a day (QID) | ORAL | 1 refills | Status: DC

## 2017-01-27 MED ORDER — ARIPIPRAZOLE 10 MG PO TABS: 10.0000 mg | ORAL_TABLET | Freq: Every day | ORAL | 2 refills | Status: DC

## 2017-01-27 NOTE — Progress Notes (Signed)
Patient ID: Lovett SoxJonathan Poet, male   DOB: 1980-10-23, 36 y.o.   MRN: [id number] S- Pt c/o lack of efficacy for Prozac 80mg . Seen 01/01/2017 for same and prescribed Brintellix-was told his coverage for uninsured wouldnt cover and failed to notify this provider until FU today/Genesight not done for lack of insurance O- per Subjective A -Treatment resistant Depression P-Adjunct RX Abilify 10 mg daily     FU 1 weel      Reagan will check on possible free Genesight test for indigent/uninsured patients thru their Lab-

## 2017-01-28 ENCOUNTER — Encounter (HOSPITAL_COMMUNITY): Payer: Self-pay | Admitting: Psychiatry

## 2017-01-28 NOTE — Progress Notes (Signed)
    Daily Group Progress Note  Program: CD-IOP   01/28/2017 [name] [id number]  Diagnosis:  Methamphetamine use disorder, severe, dependence (HCC)  Major depressive disorder, recurrent episode, moderate (HCC)  Generalized anxiety disorder   Sobriety Date: 6/21  Group Time: 1-2:30  Participation Level: Active  Behavioral Response: Appropriate and Sharing  Type of Therapy: Process Group  Interventions: CBT, Strength-based, Supportive and Reframing  Topic: Patients were active and engaged for 1.5 hr group process session. Patients shared about their successes and struggles in early recovery. Pts were instructed to discuss topical 12 step topics that relate to their recovery. Pts were invited to share about their homework assignment from last session.     Group Time: 2:30-4  Participation Level: Active  Behavioral Response: Appropriate and Sharing  Type of Therapy: Psycho-education Group  Interventions: Other: Guest Speaker, 12 Step Meetings  Topic: Patients were active and engaged for 1.5 hour group psychoeducation session. A guest speaker shared about his journey in recovery from cocaine. One pt graduated successfully and received feedback and supportive goodbyes from group members. Patients stated they enjoyed the exercise and felt it was therapeutic.    Summary: Patient was active and engaged in group. He shared that he attended 1 AA meeting since last group. He continues to deny severe cravings for meth, and states that he feels "somewhat stronger" each new day. He reported he feels more comfortable in meetings and is open to the idea of getting a sponsor. He listened actively to the guest speaker. He offered helpful feedback for graduating member.   UDS collected: No Results: negative  AA/NA attended?: YesThursday  Sponsor?: No  Wes Swan, LPCA LCASA 01/28/2017 10:58 AM

## 2017-01-28 NOTE — Progress Notes (Signed)
    Daily Group Progress Note  Program: CD-IOP   01/28/2017 Eilene Ghazi [id number]  Diagnosis:  Methamphetamine use disorder, severe, dependence ([place])  Major depressive disorder, recurrent episode, moderate (HCC)  Generalized anxiety disorder  Obsessive-compulsive behavior  Polysubstance abuse   Sobriety Date: 6/21  Group Time: 1-2:30pm  Participation Level: Active  Behavioral Response: Appropriate and Sharing  Type of Therapy: Process Group  Interventions: Supportive  Topic: Process: the first half of group was spent in process. Members shared about the past weekend identified any challenges or obstacles that may have presented themselves. The medical director met with two group members to discuss medications. Four random drug tests were collected.   Group Time: 2:30-4pm  Participation Level: Active  Behavioral Response: Sharing  Type of Therapy: Psycho-education Group  Interventions: Solution Focused  Topic: Psycho-Ed: Body Scan/The Serenity Prayer. The second half of group began with a 10-minute Body Scan Meditation. The meditation was from https://martin-page.info/. Most of the group members found the scan very relaxing. The meditation was followed by a discussion about the Serenity Prayer. Members were provided a handout and asked to identify five things they could not change and five things that they could change. A lively discussion followed with good feedback among members. The importance of focusing on what one can change was articulated and understood by all present.   Summary: The patient identified his shining moment as 'picking up my 30-day chip on Saturday. The group applauded this news. He expressed concerns about his mother and her engagement with a man online and sending money to [place]. Other members expressed frustration and felt it sounded as if his mother was being scammed. The patient agreed, but reminded the group that she is supporting him and he cannot  control her. Ultimately, she will do want she wants to do whether he approves or not. The patient reported he and his mother intend to move his things out of his apartment gradually and will enlist his brother-in-law for the heavier items. When asked if he was relieved about not returning there to live, he admitted it was sort of an ending, but did not necessarily mean he was not able to use again. The patient reported the body scan was relaxing and he continues to practice the meditation exercises in his daily life. In the psycho-ed on the Serenity Prayer, the patient identified things he cannot change including the past, his addiction and what others think of him. He can change his weight, employability and how I treat others and myself. He can also insure he attends meetings and reaches out to others in recovery. The patient continues to make steady progress in his recovery. He responded well to this intervention.    UDS collected: Yes Results: pending  AA/NA attended?: YesFriday and Sunday  Sponsor?: No   Brandon Melnick, LCAS 01/28/2017 10:30 AM

## 2017-01-29 ENCOUNTER — Other Ambulatory Visit (HOSPITAL_COMMUNITY): Payer: Self-pay | Admitting: Psychology

## 2017-01-29 DIAGNOSIS — F1520 Other stimulant dependence, uncomplicated: Secondary | ICD-10-CM

## 2017-01-29 DIAGNOSIS — F331 Major depressive disorder, recurrent, moderate: Secondary | ICD-10-CM

## 2017-01-29 DIAGNOSIS — B20 Human immunodeficiency virus [HIV] disease: Secondary | ICD-10-CM

## 2017-01-30 ENCOUNTER — Encounter (HOSPITAL_COMMUNITY): Payer: Self-pay | Admitting: Psychology

## 2017-01-30 ENCOUNTER — Encounter (HOSPITAL_COMMUNITY): Payer: Self-pay | Admitting: Licensed Clinical Social Worker

## 2017-01-30 ENCOUNTER — Other Ambulatory Visit (HOSPITAL_BASED_OUTPATIENT_CLINIC_OR_DEPARTMENT_OTHER): Payer: Self-pay | Admitting: Psychology

## 2017-01-30 DIAGNOSIS — F411 Generalized anxiety disorder: Secondary | ICD-10-CM

## 2017-01-30 DIAGNOSIS — F331 Major depressive disorder, recurrent, moderate: Secondary | ICD-10-CM

## 2017-01-30 DIAGNOSIS — F1520 Other stimulant dependence, uncomplicated: Secondary | ICD-10-CM

## 2017-01-30 NOTE — Progress Notes (Signed)
    Daily Group Progress Note  Program: CD-IOP   01/30/2017 Eilene Ghazi [id number]  Diagnosis:  No diagnosis found.   Sobriety Date: 12/26/16  Group Time: 1-2:30pm  Participation Level: Active  Behavioral Response: Appropriate and Sharing  Type of Therapy: Process Group  Interventions: Supportive  Topic: Process: the first half of group was spent in process. Member shared their 'shining moments' and 'speed bumps'. Any struggles or challenges in  early recovery were identified and discussed among group members. Three random drug tests were collected. The medical director met with two members to monitor recent medication changes. Two group members were absent.  Group Time: 2:30-4pm  Participation Level: Active  Behavioral Response: Sharing  Type of Therapy: Psycho-education Group  Interventions: CBT  Topic: Psycho-Ed: One Minute Breathing Exercise/Guilt & Shame. The second half of group was spent in a psycho-ed. After a brief breathing activity, members watched a slide show on grief and shame. Members were asked to write down some mistakes they had made in their lives. Later, after the slide show and differences between guilt and shame clearly identified, members were asked to determine if the mistakes were guilt or shame based.  The session proved very effective with members disclosing intimate and emotional details of their early experiences with guilt and shame.   Summary: The patient reported his shining moment was going to one of the [place] meetings in [place] where he has become something of a regular and having other people encouraging him to sit in 'my seat'. He felt as if he had been accepted. His speed bump and more of the confusion around her 'online friend' and sending money to what clearly seems some sort of fraud. When asked about his job search, the patient admitted he had not really made much of any effort, but he was going to get his laptop on Friday. He  explained that he and his mother are going to his apartment here in [place] on Friday and begin moving things out. The patient reported he will be able to get on the job search with his laptop, but noted he does not want anything high stress, but just a part-time job; he has been out of work for over three years. The patient provided helpful feedback in response to another group members' complaints about sleep. He reported that he is sleeping much better now and assured her that her sleep will improve as she attains more sobriety. During the psycho-ed, the patient was able to identify an example of how someone has turned a formerly negative stigma into a positive. He identified how there are now many "plus-size" models and how they are beautiful. When asked about any guilt of shame he might have, the patient admitted he feels guilty about the 'things I did to support my habit". He also identified that he feels guilty about "using the money my mother gave me for living expenses on drugs". The patient made some helpful comments and displays a growing insight into his addictive behavior. He continues to make measurable progress and has remained drug-free for over 30 days.    UDS collected: No Results: But Monday's test was negative  AA/NA attended?: YesTuesday  Sponsor?: No   Brandon Melnick, [place] 01/30/2017 9:54 AM

## 2017-01-31 ENCOUNTER — Encounter (HOSPITAL_COMMUNITY): Payer: Self-pay | Admitting: Psychology

## 2017-01-31 ENCOUNTER — Encounter (HOSPITAL_COMMUNITY): Payer: Self-pay | Admitting: Licensed Clinical Social Worker
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
## 2017-01-31 NOTE — Progress Notes (Signed)
    Daily Group Progress Note  Program: CD-IOP   01/31/2017 Lovett SoxJonathan Bensen [id number]  Diagnosis:  Methamphetamine use disorder, severe, dependence (HCC)  Major depressive disorder, recurrent episode, moderate (HCC)  Generalized anxiety disorder   Sobriety Date: 6/21  Group Time: 1-2:30  Participation Level: Active  Behavioral Response: Appropriate, Sharing and Motivated  Type of Therapy: Process Group  Interventions: CBT, Strength-based, Supportive and Reframing  Topic: Patients were active and engaged for a process session in which pts were invited to share about their challenges and successes in early recovery. Pts discussed learned and implemented coping skills. A UDS was collected from one member who was unexpectedly absent last group. That member also became somewhat agitated when discussing his "powerlessness against his disease". Other group members were quick to reframe this pt's statement, encouraging him to seek social support. Some members became agitated in session as they discussed their external circumstances and challenges in their life. Other group members offered helpful feedback for communication problems, 12 step encouragement, and support.     Group Time: 2:30-4  Participation Level: Active  Behavioral Response: Appropriate and Sharing  Type of Therapy: Psycho-education Group  Interventions: CBT, Psychosocial Skills: Self Esteem and Supportive  Topic: Patients were active and engaged for a psychoeducation session led by counselors which continued a lesson on "Guilt/Shame". Patients were asked to review yesterday's group lesson and take a deeper dive into shame's impact on "Self-Esteem". A handout was passed out that encouraged pts to explore how shame contributed to their low self-esteem. Discuss was open and sharing.    Summary: Patient was engaged in session. He shared he was feeling hopeful and had a great individual session prior to this  group. He discussed his increased motivation since entering tx. Pt reported he had not attended any 12 meetings since last group. Pt reported he felt proud he was able to "let go of interpersonal conflict he had w/ his mother". When asked what was working, pt responded he is doing well w/ changing his behaviors and cognitions. Pt was invited to share feedback directly with another member and pt responded well and articulately. Pt appears more comfortable and open in group.    UDS collected: No Results: negative  AA/NA attended?: No  Sponsor?: No   Wes Swan, LPCA LCASA 01/31/2017 8:45 AM

## 2017-02-03 ENCOUNTER — Other Ambulatory Visit (HOSPITAL_COMMUNITY): Payer: Self-pay | Admitting: Psychology

## 2017-02-03 DIAGNOSIS — F1520 Other stimulant dependence, uncomplicated: Secondary | ICD-10-CM

## 2017-02-03 DIAGNOSIS — F331 Major depressive disorder, recurrent, moderate: Secondary | ICD-10-CM

## 2017-02-04 ENCOUNTER — Other Ambulatory Visit: Payer: Self-pay | Admitting: *Deleted

## 2017-02-04 ENCOUNTER — Other Ambulatory Visit (HOSPITAL_COMMUNITY): Payer: Self-pay | Admitting: Medical

## 2017-02-04 ENCOUNTER — Encounter (HOSPITAL_COMMUNITY): Payer: Self-pay | Admitting: Psychology

## 2017-02-04 DIAGNOSIS — B20 Human immunodeficiency virus [HIV] disease: Secondary | ICD-10-CM

## 2017-02-04 MED ORDER — ELVITEG-COBIC-EMTRICIT-TENOFAF 150-150-200-10 MG PO TABS: 1.0000 | ORAL_TABLET | Freq: Every day | ORAL | 5 refills | Status: DC
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
## 2017-02-05 ENCOUNTER — Other Ambulatory Visit (HOSPITAL_COMMUNITY): Payer: Self-pay | Attending: Psychiatry | Admitting: Psychology

## 2017-02-05 DIAGNOSIS — F10129 Alcohol abuse with intoxication, unspecified: Secondary | ICD-10-CM | POA: Insufficient documentation

## 2017-02-05 DIAGNOSIS — F411 Generalized anxiety disorder: Secondary | ICD-10-CM | POA: Insufficient documentation

## 2017-02-05 DIAGNOSIS — F1910 Other psychoactive substance abuse, uncomplicated: Secondary | ICD-10-CM | POA: Insufficient documentation

## 2017-02-05 DIAGNOSIS — F1520 Other stimulant dependence, uncomplicated: Secondary | ICD-10-CM | POA: Insufficient documentation

## 2017-02-05 DIAGNOSIS — B20 Human immunodeficiency virus [HIV] disease: Secondary | ICD-10-CM | POA: Insufficient documentation

## 2017-02-05 DIAGNOSIS — F331 Major depressive disorder, recurrent, moderate: Secondary | ICD-10-CM | POA: Insufficient documentation

## 2017-02-05 DIAGNOSIS — R4681 Obsessive-compulsive behavior: Secondary | ICD-10-CM | POA: Insufficient documentation

## 2017-02-06 ENCOUNTER — Encounter (HOSPITAL_COMMUNITY): Payer: Self-pay | Admitting: Licensed Clinical Social Worker

## 2017-02-06 ENCOUNTER — Other Ambulatory Visit (HOSPITAL_COMMUNITY): Payer: Self-pay | Admitting: Psychology

## 2017-02-06 DIAGNOSIS — F331 Major depressive disorder, recurrent, moderate: Secondary | ICD-10-CM

## 2017-02-06 DIAGNOSIS — F1520 Other stimulant dependence, uncomplicated: Secondary | ICD-10-CM

## 2017-02-06 DIAGNOSIS — F411 Generalized anxiety disorder: Secondary | ICD-10-CM

## 2017-02-06 NOTE — Progress Notes (Signed)
   THERAPIST PROGRESS NOTE  Session Time: 12-12:45  Participation Level: Active  Behavioral Response: Casual and NeatAlertEuthymic  Type of Therapy: Individual Therapy  Treatment Goals addressed: Diagnosis: SUD, MDD, GAD  Interventions: CBT and Solution Focused  Summary: Jimmy Davis is a 36 y.o. male who presents with Methamphetamine Use Disorder, Depression, and Anxiety. Counselor and pt discussed recent difficulties with his recovery including moving out of his apartment and dealing with more intense cravings.   Suicidal/Homicidal: Nowithout intent/plan  Therapist Response: Pt responding well to therapy, stating he is "getting a lot out of this tx". He continues to stay sober and test negative for all illicit drugs and alcohol. He displays moderate coping skills for distracting himself. Pt admits he wants to try dating "just for fun" and to send out 2 resumes before Monday.  Plan: Return again in 1 weeks.  Diagnosis:    ICD-10-CM   1. 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 Major depressive disorder, recurrent episode, moderate (HCC) F33.1   3. Generalized anxiety disorder F41.1        Margo CommonWesley E Swan 02/06/2017

## 2017-02-07 ENCOUNTER — Encounter (HOSPITAL_COMMUNITY): Payer: Self-pay | Admitting: Psychology

## 2017-02-07 NOTE — Progress Notes (Signed)
    Daily Group Progress Note  Program: CD-IOP   02/07/2017 Eilene Ghazi [id number]  Diagnosis:  Methamphetamine use disorder, severe, dependence ([place])  Major depressive disorder, recurrent episode, moderate (HCC)  Generalized anxiety disorder   Sobriety Date: 6/21  Group Time: 1-2:30  Participation Level: Active  Behavioral Response: Appropriate and Sharing  Type of Therapy: Process Group  Interventions: CBT, Strength-based, Supportive and Reframing  Topic: Patients were active and engaged in group process session. Members shared about their experience since last group and challenges and successes they had. Some UDS were collected from members. One new member was present and shared briefly about this experience w/ drug addiction and past tx. This new member met with Darlyne Russian to establish care. Counselors administered PHQ9 and GAD7 to all pts.     Group Time: 2:30-4  Participation Level: Active  Behavioral Response: Appropriate and Sharing  Type of Therapy: Psycho-education Group  Interventions: CBT  Topic: Patients were active and engaged in group psychoeducation session. Counselors led members in a lesson on core beliefs and cognitive distortion in early recovery. CBT strategies were taught and modeled to help w/ pts coping skills.   Summary: Pt shared openly, actively, and was engaged in session. He provided some helpful feedback for another group member. He reported he attended 1 AA meeting. He reported he is "feeling more hopeful and happy" and is somewhat surprised by this. Pt stated he recognizes he cares about the group and thinks about group members outside of group times. Pt struggled to challenge his negative core belief that he is "ugly and fat". Counselor helped pt challenge societal norms about beauty and defining his personhood on his looks. Pt appeared encouraged by this but admitted it was "difficult to think differently".    UDS collected:  Yes Results: negative  AA/NA attended?: YesWednesday  Sponsor?: No   Wes Swan, LPCA LCASA 02/07/2017 8:30 AM

## 2017-02-10 ENCOUNTER — Other Ambulatory Visit (HOSPITAL_COMMUNITY): Payer: Self-pay | Admitting: Psychology

## 2017-02-10 ENCOUNTER — Encounter (HOSPITAL_COMMUNITY): Payer: Self-pay | Admitting: Psychology

## 2017-02-10 DIAGNOSIS — F411 Generalized anxiety disorder: Secondary | ICD-10-CM

## 2017-02-10 DIAGNOSIS — F331 Major depressive disorder, recurrent, moderate: Secondary | ICD-10-CM

## 2017-02-10 DIAGNOSIS — F1520 Other stimulant dependence, uncomplicated: Secondary | ICD-10-CM

## 2017-02-10 NOTE — Progress Notes (Signed)
    Daily Group Progress Note  Program: CD-IOP   02/10/2017 Jimmy Davis [id number]  Diagnosis:  No diagnosis found.   Sobriety Date: 6/21  Group Time: 1-2:30pm  Participation Level: Active  Behavioral Response: Sharing  Type of Therapy: Process Group  Interventions: Supportive  Topic: Process: The first part of group was spent in process. Members shared how they are doing and feeling in early recovery. They identified their 'shining moment' and 'speed bump'. At least two group members noted how differently they felt today as compared to Monday and the changing moods and outlooks in early recovery. One member appeared today after having missed yesterday's group session. He had returned to use and described the events that had led to his drug use. A drug test was collected from him.  Group Time: 2:30-4pm  Participation Level: Active  Behavioral Response: Sharing  Type of Therapy: Psycho-education Group  Interventions: CBT  Topic: Psycho-Ed: Popsicle Sticks. The second half of group included members reading the word or phrase on the popsicle stick they had picked earlier in the session. They are asked to describe what this means to them at this time in their new and sober life. There was a good discussion as group members shared their thoughts and provided feedback to each other. The final few minutes included members disclosing plans for the upcoming weekend and how they intended to work their program and return to group on Monday with the same sobriety date.  Summary: The patient reported he was hoping to be able to get the genetic testing and this would be his shining moment. His speed bump was experiencing some cravings. The counselor reminded him that having thoughts of using are not necessarily a sign he is doing something wrong. The question is, does he entertain these thoughts or distract himself and focus on other healthier things? He reported he was told by his  mother that his father had suffered from cirrhosis, so this was helpful information and allowed him more understanding about how he himself might have become chemically dependent. His father died because of a massive heart attack, but he did have cirrhosis and was clearly and alcoholic. In the psycho-ed, the patient's word was 'growth'. The patient identified that he is being more social, talking more and learning a lot about things he didn't know before. He identified these as signs of growth. The patient agreed that a 'sober environment enhances my growth". The patient continues to display a 'growing' insight into his addiction and identifying strategies to address his addictive self. He responded well to this intervention.    UDS collected: No Results:   AA/NA attended?: No  Sponsor?: No   [name], LCAS 02/10/2017 10:56 AM

## 2017-02-11 ENCOUNTER — Encounter (HOSPITAL_COMMUNITY): Payer: Self-pay

## 2017-02-12 ENCOUNTER — Other Ambulatory Visit (HOSPITAL_COMMUNITY): Payer: Self-pay | Admitting: Psychology

## 2017-02-12 DIAGNOSIS — B20 Human immunodeficiency virus [HIV] disease: Secondary | ICD-10-CM

## 2017-02-12 DIAGNOSIS — Z79899 Other long term (current) drug therapy: Secondary | ICD-10-CM

## 2017-02-12 DIAGNOSIS — F331 Major depressive disorder, recurrent, moderate: Secondary | ICD-10-CM

## 2017-02-12 DIAGNOSIS — F411 Generalized anxiety disorder: Secondary | ICD-10-CM

## 2017-02-12 DIAGNOSIS — F1520 Other stimulant dependence, uncomplicated: Secondary | ICD-10-CM

## 2017-02-12 DIAGNOSIS — F1910 Other psychoactive substance abuse, uncomplicated: Secondary | ICD-10-CM

## 2017-02-12 NOTE — Progress Notes (Signed)
MED Check Received scholarship for [facility]-is taking 10 mg of Abilify HS C/O sleepiness;somnolence Advised medication can take time but reminded of behavioral component of Depression and acting against this sense of fatigue by exercise can also effect psyche.He mentioned stopping naps. Fu next week [facility]

## 2017-02-13 ENCOUNTER — Encounter (HOSPITAL_COMMUNITY): Payer: Self-pay | Admitting: Licensed Clinical Social Worker

## 2017-02-13 ENCOUNTER — Other Ambulatory Visit (HOSPITAL_COMMUNITY): Payer: Self-pay | Admitting: Psychology

## 2017-02-13 ENCOUNTER — Encounter (HOSPITAL_COMMUNITY): Payer: Self-pay | Admitting: Psychology

## 2017-02-13 DIAGNOSIS — F1520 Other stimulant dependence, uncomplicated: Secondary | ICD-10-CM

## 2017-02-13 DIAGNOSIS — F411 Generalized anxiety disorder: Secondary | ICD-10-CM

## 2017-02-13 DIAGNOSIS — F331 Major depressive disorder, recurrent, moderate: Secondary | ICD-10-CM

## 2017-02-13 NOTE — Progress Notes (Signed)
    Daily Group Progress Note  Program: CD-IOP   02/13/2017 Eilene Ghazi [id number]  Diagnosis:  Methamphetamine use disorder, severe, dependence ([place])  Major depressive disorder, recurrent episode, moderate (HCC)  Generalized anxiety disorder   Sobriety Date: 6/21  Group Time: 1-2:30  Participation Level: Active  Behavioral Response: Appropriate and Sharing  Type of Therapy: Process Group  Interventions: CBT, Strength-based, Supportive and Reframing  Topic: Patients were active and engaged for process session. Counselors led discussion on pts struggles and successes in early recovery. Counselors encouraged pts to discuss the coping skills utilized since last session. One new member was present and shared briefly but openly about her battle w/ weight, alcohol, and opiates. One member shared about a particularly tough relapse after attending a wedding that weekend. UDS were collected from some members. Some pts met Darlyne Russian for medication management and to establish care.      Group Time: 2:30-4  Participation Level: Active  Behavioral Response: Appropriate and Sharing  Type of Therapy: Psycho-education Group  Interventions: Other: Art Therapy, Chair Yoga  Topic: Patients were active and engaged for psychoeducation session. A guest counselor, Jan Fireman, led a 1hr chair yoga session and focused on using this skill for dealing w/ drug/alcohol cravings. Counselors then led a 1 hour art activity in which pts created paper mache masks that represent their external and internal feelings about themselves.    Summary: Patient was active and engaged for session. He reported he attended 2 NA meetings over the weekend. He was happy to report that he has now been in tx w/ CDIOP for 2 mo which feels like "an accomplishment to him". He reported on his slow process of moving out of his apartment which he was encouraged to "speed up" bc it could be triggering to draw it out.  Pt agreed but was unable to voice any measurable action steps towards solving this problem. Pt continued to report he is "not doing much" with his time and feels generally depressed most days. Pt was encouraged to create and comply w/ a routine that involves exercise, fun, and healthy foods.    UDS collected: No Results: negative  AA/NA attended?: YesFriday and Saturday  Sponsor?: No   Youlanda Roys, LPCA LCASA 02/13/2017 4:51 PM

## 2017-02-14 ENCOUNTER — Encounter (HOSPITAL_COMMUNITY): Payer: Self-pay | Admitting: Psychology

## 2017-02-14 NOTE — Progress Notes (Signed)
    Daily Group Progress Note  Program: CD-IOP   02/14/2017 Jimmy Davis [id number]  Diagnosis:  Methamphetamine use disorder, severe, dependence (HCC)  Major depressive disorder, recurrent episode, moderate (HCC)  Generalized anxiety disorder   Sobriety Date: 6/21  Group Time: 1-2:30  Participation Level: Active  Behavioral Response: Appropriate and Sharing  Type of Therapy: Process Group  Interventions: CBT, Strength-based, Supportive and Reframing  Topic: Patients were active and engaged in process session. Counselor led processing time in which pts discussed their struggles and successes in early recovery since last group. A UDS was collected from one group member who was absent last group. Pts also shared in a topical discussion related to AA/Recovery. Discussion was open and many shared deeply about their emotional triggers to use. One member admitted he had secretly called his dealer to plan a return to use but instead chose not to use based on the feedback he got from a group member.     Group Time: 2:30-4  Participation Level: Active  Behavioral Response: Appropriate and Sharing  Type of Therapy: Psycho-education Group  Interventions: CBT and Other: Relapse Prevention  Topic: Patients were active and engaged in psychoeducation session. Counselor led psychoeducation time in which the topic of Relapse Prevention was discussed. Counselor led pts through understanding the links in the chain of relapse: "Trigger-Thoughts-Cravings-Use". Pts verbalized understanding and were able to connect the lesson to their own personal stories of relapse and triggers.   Summary: Pt was very active and engaged in session. He reported that he wanted to "admit to the group that he was considering using and that he almost used yesterday". He reported he has been planning to use since Monday of this week and contacted his dealer yesterday but ultimately said 'no' to his dealer bc  he remembered what another group member told him: "Using will delay your moving out of your mother's house". Pt admitted he had bought a lighter to smoke w/ on Tuesday and was planning to buy a pipe yesterday but did not. Pt gave his lighter up in group and the group celebrated his honesty and openness. Pt reported on his craving to use and how his depression and "feeling stuck" is making him want to use Meth. Pt was encouraged to practice getting out, setting schedule, and finding activities that he finds fun to engage in this weekend. Pt was encouraged to work on his resume and job search for 4 hours this weekend.    UDS collected: No Results: negative  AA/NA attended?: No  Sponsor?: No   Wes Swan, LPCA LCASA 02/14/2017 10:43 AM

## 2017-02-14 NOTE — Progress Notes (Signed)
Daily Group Progress Note  Program: CD-IOP   02/14/2017 Eilene Ghazi [id number]  Diagnosis:  Methamphetamine use disorder, severe, dependence ([place])  Encounter for medication management  Major depressive disorder, recurrent episode, moderate (HCC)  Generalized anxiety disorder  Human immunodeficiency virus (HIV) disease ([place])  Polysubstance abuse   Sobriety Date: 12/26/16  Group Time: 1-2:30pm  Participation Level: Active  Behavioral Response: Appropriate and Sharing  Type of Therapy: Process Group  Interventions: Supportive  Topic: Process: The first half of group was spent in process. Members shared about the challenges and strengths that they experienced in early recovery. Two of the members who were absent on Monday returned and shared about their struggles. The medical director met with four members during this session to complete medication checks. The session was very intense with members sharing emotions that were deep and risked being vulnerable. Five random drug tests were collected today.  Group Time: 2:30-4pm  Participation Level: Active  Behavioral Response: Sharing  Type of Therapy: Psycho-education Group  Interventions: Art therapy  Topic: Psycho-ed: Masks; What You Hope to Show and What You're Really Feeling? The second half of group was spent in a psycho-ed. It was a follow-up to the session on Monday. Members were given white paper masks and asked to identify the impressions they tried to present to the world on the outside of the mask and the feelings they really experienced on the inside of their mask. It was a powerful session with evidence that group members expect more of themselves and are harder on themselves than on anyone else. The degree of vulnerability and disclosure provided today was to be applauded and suggested a degree of trust and closeness that is the goal of many group sessions.   Summary: The patient reported he was  'feeling bummed out' about moving out of his apartment. The counselor wondered if perhaps he was not making it harder on himself and perhaps generating continual triggering by taking so long to get everything out for the last time. The patient pondered this question and admitted he hadn't really thought about it, but after further thought, he admitted this could be true. The patient shared that he is 'worried about my sister'. He explained that she has a problem with shopping. Her husband is in contact with the bank and trying to figure out how much money she has spent. The patient reported his mother was very upset and crying because the husband is very angry. The patient explained that her spending is like an addiction. The counselor asked him how it was to be looking from a different perspective? As someone observing another person in active addiction. The patient agreed it was different and wondered how he could speak with her about it? He received helpful feedback from his fellow group members, but was discouraged from approaching her in any way other than from a place of understanding. The patient's perspective as a supportive family member was revisited when sharing about his mask. The outside of his mask included a sunny, radiant and positive person. He sought to express confidence and strength. The source of strength was what he was hoping to represent for his family, including his sister during this difficult time along with his mother. While on the inside, the patient feels 'overweigh, overly sensitive and vulnerable. When the counselor asked him what he would like to see change on the inside of his mask as he progresses in treatment, the patient identified the desire to be willing 'to  be more vulnerable'. The patient articulated his recognition that refusing to be vulnerable keeps everyone distant. This patient made some wonderful disclosures today in group. He was more vulnerable and the group accepted  his disclosures easily and without judgement. The patient continues to make progress in this new abstinence-based lifestyle and this intervention was effective for him.   UDS collected: Yes Results: pending  AA/NA attended?: YesTuesday  Sponsor?: No   Brandon Melnick, [place] 02/14/2017 10:29 AM

## 2017-02-17 ENCOUNTER — Other Ambulatory Visit (HOSPITAL_COMMUNITY): Payer: Self-pay | Admitting: Psychology

## 2017-02-17 DIAGNOSIS — F411 Generalized anxiety disorder: Secondary | ICD-10-CM

## 2017-02-17 DIAGNOSIS — F1520 Other stimulant dependence, uncomplicated: Secondary | ICD-10-CM

## 2017-02-17 DIAGNOSIS — F331 Major depressive disorder, recurrent, moderate: Secondary | ICD-10-CM

## 2017-02-18 ENCOUNTER — Encounter (HOSPITAL_COMMUNITY): Payer: Self-pay | Admitting: Licensed Clinical Social Worker

## 2017-02-18 NOTE — Progress Notes (Signed)
CDIOP INDIVIDUAL SESSION  THERAPIST PROGRESS NOTE  Session Time: 12-1pm  Participation Level: Active  Behavioral Response: NeatAlertEuthymic  Type of Therapy: Individual Therapy  Treatment Goals addressed: Anxiety and Coping  Interventions: CBT, Strength-based and Supportive  Summary: Jimmy Davis is a 36 y.o. male who presents with Methamphetamine Use Disorder, GAD, and MDD. Counselor and pt continued to discuss pt's hx of bullying and being told he was "weird" when he was 9yo because he told a friend "it would be fun to be a girl". Pt was consequently made fun of throughout high school and was "taught to hide his true self, which was outgoing and would break into song at a moments notice". Pt admitted he was considering using this week and has secretly been planning a return to use. He reported on contacting his dealer last night but being "unalbe to follow through" and ultimately did not use. Pt admitted he bought a lighter to smoke w/ but will now reveal all this to the group in the upcoming session. Counselor and pt discussed practical routines and strategies for pt's upcoming weekend. Pt plans to work on his resume and attend at least 2 NA meetings.   Suicidal/Homicidal: Nowithout intent/plan  Therapist Response: Pt responding well to supportive, emotion focused techniques, and being guided through his immediate experience of individual therapy.  Plan: Return again in 1 weeks.  Diagnosis: No diagnosis found.     Jimmy Davis, LCAS-A 02/18/2017

## 2017-02-19 ENCOUNTER — Other Ambulatory Visit (HOSPITAL_COMMUNITY): Payer: Self-pay | Admitting: Psychology

## 2017-02-19 DIAGNOSIS — F1520 Other stimulant dependence, uncomplicated: Secondary | ICD-10-CM

## 2017-02-19 DIAGNOSIS — F411 Generalized anxiety disorder: Secondary | ICD-10-CM

## 2017-02-19 DIAGNOSIS — F331 Major depressive disorder, recurrent, moderate: Secondary | ICD-10-CM

## 2017-02-20 ENCOUNTER — Other Ambulatory Visit (HOSPITAL_COMMUNITY): Payer: Self-pay | Admitting: Psychology

## 2017-02-20 ENCOUNTER — Encounter (HOSPITAL_COMMUNITY): Payer: Self-pay | Admitting: Psychology

## 2017-02-20 DIAGNOSIS — F331 Major depressive disorder, recurrent, moderate: Secondary | ICD-10-CM

## 2017-02-20 DIAGNOSIS — F1520 Other stimulant dependence, uncomplicated: Secondary | ICD-10-CM

## 2017-02-20 NOTE — Progress Notes (Signed)
Patient ID: Jimmy Davis, male   DOB: 05/10/1981, 36 y.o.   MRN: [id number] Counselor entered PHQ and GAD scores from Monday 8/13.

## 2017-02-20 NOTE — Progress Notes (Signed)
    Daily Group Progress Note  Program: CD-IOP   02/20/2017 Jimmy Davis [id number]  Diagnosis:  Methamphetamine use disorder, severe, dependence ([name])  Major depressive disorder, recurrent episode, moderate (HCC)  Generalized anxiety disorder   Sobriety Date: 6/21  Group Time: 1-2:30  Participation Level: Active  Behavioral Response: Appropriate and Sharing  Type of Therapy: Process Group  Interventions: CBT, Strength-based and Supportive  Topic: Patients were active and engaged in process session today. They were encouraged to share about their successes and struggles in early recovery. Counselors helped pts identify specific skills and tools they have learned in [place] to handle triggers and cravings to use. Pts discussed their attendance in self-help groups and their sobriety. One new member was present today and shared openly. Some UDS were collected from members and some results from previous tests were returned. Some pts met w/ program director Darlyne Russian for medication management and f/u.     Group Time: 2:30-4  Participation Level: Active  Behavioral Response: Appropriate and Sharing  Type of Therapy: Psycho-education Group  Interventions: CBT and Other: Spirituality  Topic: Patients were active and engaged in psychoeducation session today. Pts were led by counselors and a Psychologist, forensic Messenger who led an experiential exercise in "being honest". Pts were invited to go around the room 1 by 1 and share a way that they "pretend" in group to make situations appear more positive than they actually are. Pts shared openly and appeared to grow in their trust and comfort w/ each other and w/ group facilitators.    Summary: Patient was active and engaged in session today. He reported that he attended 1 AA meetings since yesterday. He shared that he has moved all of his belongings out of his apt and only has to clean it before returning the key. He was  celebrated by the group since this had been an ongoing struggle to "hurry up and leave his apt instead of romanticizing his old using place". Pt stated he has gotten a call back from [employer] and been asked to come in for an interview, which makes him happy and hopeful. Pt shared openly during spirituality portion and admitted he "pretends he's not mad when he actually is" at other group members.    UDS collected: Yes Results: negative  AA/NA attended?: YesWednesday  Sponsor?: No   Jimmy Davis, LPCA LCASA 02/20/2017 11:49 AM

## 2017-02-21 ENCOUNTER — Encounter (HOSPITAL_COMMUNITY): Payer: Self-pay | Admitting: Psychology

## 2017-02-21 NOTE — Progress Notes (Signed)
    Daily Group Progress Note  Program: CD-IOP   02/21/2017 Jimmy Davis [id number]  Diagnosis:  No diagnosis found.   Sobriety Date: 6/21  Group Time: 1-2:30pm  Participation Level: Active  Behavioral Response: Sharing  Type of Therapy: Process Group  Interventions: Supportive  Topic: Process: The first half of group was spent in process. Members shared about their day, identifying 'speed bumps' and 'shining moments' experienced since we last met. Members talked about their struggles, including irritability, clumsiness and lethargy. One member lamented the difficulty getting out of bed now that she has no pills to take. Group members shared their experiences with each other and provided helpful feedback. One member was absent today.  Group Time: 2:30-4pm  Participation Level: Active  Behavioral Response: Appropriate and Sharing  Type of Therapy: Psycho-education Group  Interventions: Strength-based  Topic: Psycho-ed: The second half of group was spent in a psycho-ed. Members were asked to pick out a popsicle stick that covered a topic or issue they wanted to discuss. Upon gathering the sticks, members were asked to pass them to the left and each got a new popsicle stick. Members shared how they viewed the word or phrase written on their stick and how it related to their new lives in early recovery? The topics generated intense conversation and revealed some dysfunctional perspectives and thinking. The session ended with members sharing about their plans for the upcoming weekend.  Summary:The patient reported his shining moment and speed bump were one and the same. He had mistakenly gotten on [address] driving back to [place] yesterday. Due to road construction, it took him over an hour to get through [place]. Despite his frustration, "I didn't feel like using". Ordinarily, that sort of thing would have gotten him high. In the psycho-ed, his word was 'Step 1'. The  patient reported, 'it's easy for me to admit I am powerless over meth'. The counselor noted that he tends to 'romanticize your drug'. He agreed and explained, "my addiction says I need the energy". He admitted that when he is romancing the drug, I question how it makes my life unmanageable? The patient reported he has begun walking with his mother in the morning and would like to see if he can go to the gym with her. The patient made some good comments and appears to be making more behavioral changes as we have encouraged him.     UDS collected: No Results:   AA/NA attended?: No  Sponsor?: No   Brandon Melnick, LCAS 02/21/2017 9:15 AM

## 2017-02-23 ENCOUNTER — Encounter (HOSPITAL_COMMUNITY): Payer: Self-pay | Admitting: Psychology

## 2017-02-23 NOTE — Progress Notes (Signed)
    Daily Group Progress Note  Program: CD-IOP   02/23/2017 Jimmy Davis [id number]  Diagnosis:  Methamphetamine use disorder, severe, dependence ([place])  Major depressive disorder, recurrent episode, moderate (HCC)  Generalized anxiety disorder   Sobriety Date: 6/21  Group Time: 1-2:30pm  Participation Level: Active  Behavioral Response: Sharing  Type of Therapy: Process Group  Interventions: Supportive  Topic: Process: the first half of group was spent in process. Members shared bout the past weekend and any struggles or challenges they may have faced. They identified their 'shining moment' and any 'speed bumps'. The medical director met with two group members for med checks and one member for her discharge. Four random drug tests were collected.  Group Time: 2:30-4pm  Participation Level: Active  Behavioral Response: Appropriate and Sharing  Type of Therapy: Psycho-education Group  Interventions: Strength-based  Topic: Psycho-Ed: Grounding Script/Internal Triggers/Graduation. The second half of group was spent in a psycho-ed. the counselor read a 'Grounding Script" and members provided feedback upon the conclusion of the reading. A handout was then provided on identifying internal triggers. Members shared their findings and most of the group identified negative feelings or experiences as triggers. This psycho-ed ended as the session neared the closing. A graduation was held and the group members' mother attended. Kind words were shared with the graduating member and she tearfully thanked her fellow group members for their support and encouragement throughout her treatment experience.   Summary: The patient reported that his shining moment was that his apartment is almost empty and he is almost out entirely. He identified his speed bump as "having lots of memories of using". The counselor pointed out that he seems to be 'romancing' his drug use and the patient agreed  entirely. The patient reported he has been a little bit busier. He attended two meetings one day and 'I didn't take a nap yesterday'. He also applied to five positions - he is seeking part-time employment. The patient does very little in the day and has plenty of time to think about his drug of addiction and romance getting high. He is making very few behavioral changes that would keep him focused on more positive things. When asked about the psycho-ed and counselor reading the scripts, the patient reported, "I liked it" because "it stopped the stream of chatter" in his brain. When identifiying triggers, the patient reported his internal triggers seem to come from 'negative mood states', like when he is bored, tired or depressed. The patient became teary as he spoke to the graduating member. They had been very supportive of each other throughout treatment and he was sorry to see her leave. He responded well to this intervention and remains drug-free, but the longer he romances and doesn't practice distracting these thoughts the more likely he is to relapse.   UDS collected: Yes Results: negative  AA/NA attended?: Yes , Thursday, Friday and Sunday  Sponsor?: No   Brandon Melnick, LCAS 02/23/2017 1:24 PM

## 2017-02-24 ENCOUNTER — Other Ambulatory Visit (INDEPENDENT_AMBULATORY_CARE_PROVIDER_SITE_OTHER): Payer: Self-pay | Admitting: Psychology

## 2017-02-24 ENCOUNTER — Encounter: Payer: Self-pay | Admitting: Psychiatry

## 2017-02-24 ENCOUNTER — Encounter (HOSPITAL_COMMUNITY): Payer: Self-pay | Admitting: Medical

## 2017-02-24 DIAGNOSIS — R4681 Obsessive-compulsive behavior: Secondary | ICD-10-CM

## 2017-02-24 DIAGNOSIS — Z79899 Other long term (current) drug therapy: Secondary | ICD-10-CM

## 2017-02-24 DIAGNOSIS — B20 Human immunodeficiency virus [HIV] disease: Secondary | ICD-10-CM

## 2017-02-24 DIAGNOSIS — F331 Major depressive disorder, recurrent, moderate: Secondary | ICD-10-CM

## 2017-02-24 DIAGNOSIS — F1910 Other psychoactive substance abuse, uncomplicated: Secondary | ICD-10-CM

## 2017-02-24 DIAGNOSIS — F411 Generalized anxiety disorder: Secondary | ICD-10-CM

## 2017-02-24 DIAGNOSIS — Z789 Other specified health status: Secondary | ICD-10-CM

## 2017-02-24 DIAGNOSIS — F10129 Alcohol abuse with intoxication, unspecified: Secondary | ICD-10-CM

## 2017-02-24 DIAGNOSIS — F1520 Other stimulant dependence, uncomplicated: Secondary | ICD-10-CM

## 2017-02-24 MED ORDER — FLUOXETINE HCL 40 MG PO CAPS: 40.0000 mg | ORAL_CAPSULE | Freq: Every day | ORAL | 0 refills | Status: DC

## 2017-02-24 MED ORDER — BACLOFEN 20 MG PO TABS: 20.0000 mg | ORAL_TABLET | Freq: Four times a day (QID) | ORAL | 1 refills | Status: DC

## 2017-02-24 MED ORDER — LAMOTRIGINE 25 MG PO TABS: ORAL_TABLET | ORAL | 0 refills | Status: DC

## 2017-02-24 NOTE — Progress Notes (Signed)
BH MD/PA/NP OP Progress Note  02/24/2017 6:22 PM Jimmy Davis  MRN:  [id number]  Chief Complaint:  Chief Complaint    Follow-up; Medication Problem; Addiction Problem; Anxiety; Depression     Subjective: "I'm OK I'm trying to lose wgt" HPI:  At prior FU visitJ Jobe Davis is 36 y/o WM admitted to the CDIOP Program 12/11/2016 for severe dependence on Methamphetamine . He was started on Baclofen 10 mg for cravings but recently came in contact with some of the drug-a direct trigger he wasnt capable of "Just saying no"to. He admits he stopped taking his Baclofen as well because it mightb interfer ewith effect of meth he was seeking. Pt also c/o lack of efficacy from Prozac for his Depression.and wondering if he could try another antidepressant.Over the time he has beeen in CDIOP his PHQ9 scores have been increased from 9 to 16.He has had a number of antidepressants .Says mostly switched because they stopped working or he quit taking. With the exception of Wellbutrin last yr which was associted with auditory hallucinatios.This was complicated by meth withdrawal but Wellbutrin was discontinued and he took Geodon for a while.  TODAY: Pt received Scholarship Jimmy Davis and is here to review his findings-he reports he had developed a problem with ejaculation so he stopped his Abilify but had resumed it. Genesight indicates he probably doesnt need high dose Prozac and that Abilify has significant genetic interference. He reported no significant improvement in his Depression with the adition of Abilify.Says he had psychotic reaction to Wellbutrin which also has significant gene drug problems. He is not wanting medication thqat wouls add to his weight problem. In terms of his dependecies he did adn mit last week that he had a plan to use going so far as to order paraphenalia but that when it came time to pay he changed his mind. He is taking his Baclofen he says.  Visit Diagnosis:    ICD-10-CM   1.  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 Major depressive disorder, recurrent episode, moderate (HCC) F33.1   4. Generalized anxiety disorder F41.1   5. Human immunodeficiency virus (HIV) disease (HCC) B20   6. Polysubstance abuse F19.10   7. Obsessive-compulsive behavior R46.81     Past Psychiatric History:  Judyann Munson, MD Infectious Disease Depression/anxiety:    FLUoxetine (PROZAC) 40 MG capsule      - refer to psychiatry with [facility]/ or [facility] Specialist Note 03/26/2016-06/04/2016 Clinical Support  Jenel Lucks, MA, LPC    [facility]  Past Medical History:  Past Medical History:  Diagnosis Date  . Anxiety   . Depression   . Human immunodeficiency virus (HIV) disease (HCC)   . 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 Substance abuse     Past Surgical History:  Procedure Laterality Date  . WISDOM TOOTH EXTRACTION      Family Psychiatric History:   M-SAD  F-Alcoholism  MGM-SAD; MGF ?, PGF?, PGM? Sister 51-good health  Family History:  Family History  Problem Relation Age of Onset  . Heart disease Father   . Mental illness Maternal Grandmother     Social History:  Social History   Social History  . Marital status: Single    Spouse name: N/A  . Number of children: N/A  . Years of education: N/A   Occupational History  . Accounting [employer]   Social History Main Topics  . Smoking status:  Never Smoker  . Smokeless tobacco: Never Used  . Alcohol use No  . Drug use: Yes    Frequency: 4.0 times per week    Types: Methamphetamines     Comment: Pt has hx of meth dependence. Sober and in tx since 12/26/16  . Sexual activity: Yes    Partners: Male    Birth control/ protection: None, Condom     Comment: declined condoms   Other Topics Concern  . 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 acetaminophen (TYLENOL) 500 MG tablet Take 1 tablet (500 mg total) by mouth every 6 (six) hours as needed for mild pain or moderate pain. 30 tablet 0  . aspirin 325 MG tablet Take 325 mg by mouth every 6 (six) hours as needed for mild pain.    . baclofen (LIORESAL) 20 MG tablet Take 1 tablet (20 mg total) by mouth 4 (four) times daily. 120 tablet 1  . cetirizine (ZYRTEC) 10 MG tablet Take 1 tablet (10 mg total) by mouth daily. 30 tablet 3  . elvitegravir-cobicistat-emtricitabine-tenofovir (GENVOYA) 150-150-200-10 MG TABS tablet Take 1 tablet by mouth daily with breakfast. 30 tablet 5  . FLUoxetine (PROZAC) 40 MG capsule Take 1 capsule (40 mg total) by mouth daily. 30 capsule 0  . lamoTRIgine (LAMICTAL) 25 MG tablet Take 1 tab x 5 days then 2 tabs x 5 days then 3 tabs x 5 days then 4 tabs QD 90 tablet 0  . Multiple Vitamin (MULTIVITAMIN) capsule Take 1 capsule by mouth daily. (Patient not taking: Reported on 09/19/2016)    . ondansetron (ZOFRAN ODT) 4 MG disintegrating tablet Take 1 tablet (4 mg total) by mouth every 8 (eight) hours as needed for nausea or vomiting. (Patient not taking: Reported on 01/02/2017) 20 tablet 0  . Sodium Chloride-Xylitol (XLEAR SINUS CARE SPRAY NA) Place 1 spray into the nose daily as needed (CONGESTION). Reported on 10/31/2015     No current facility-administered medications for this visit.     Neurologic: Headache: Negative Seizure:  Negative Paresthesias: Negative  Musculoskeletal: Strength & Muscle Tone: within normal limits Gait & Station: normal Patient leans: N/A  Psychiatric Specialty Exam: Review of Systems  Constitutional: Positive for malaise/fatigue. Negative for chills, diaphoresis, fever and weight loss.       HIV on meds  Genitourinary:       Incomplete ejaculation  Musculoskeletal: Negative for back pain, falls, joint pain, myalgias and neck pain.  Neurological: Negative for dizziness, tingling, tremors, sensory change, speech change, focal weakness, seizures, loss of consciousness, weakness and headaches.  Psychiatric/Behavioral: Positive for depression and substance abuse (Improved in treatment/CDIOP). Negative for hallucinations, memory loss and suicidal ideas. The patient is nervous/anxious (no complaint today) and has insomnia (improved with medication).     There were no vitals taken for this visit.There is no height or weight on file  to calculate BMI.  General Appearance: Neat and Well Groomed  Eye Contact:  Good  Speech:  Clear and Coherent  Volume:  Normal  Mood:  Euthymic  Affect:  Congruent  Thought Process:  Coherent and Descriptions of Associations: Intact  Orientation:  Full (Time, Place, and Person)  Thought Content: Logical, Delusions, Obsessions  Methamphetamine use related   Suicidal Thoughts:  No  Homicidal Thoughts:  No  Memory:  Negative  Judgement:  Impaired  Insight:  Lacking and seems to be beginning to learn how brain is effected by drug from previuos ed ucation/Pet Scans of adddicted brain  Psychomotor Activity:  Normal  Concentration:  Concentration: Good and Attention Span: Good  Recall:  Fair impaired when triggered to use  Fund of Knowledge: Fair  Language: Negative  Akathisia:  Negative  Handed:  Right  AIMS (if indicated):  NA  Assets:  Desire for Improvement Resilience Social Support  ADL's:  Intact  Cognition: Impaired,  Moderate by substance addiction   Sleep:  Drug induced insomnia     Treatment Plan Summary:  Decrease Prozac to 40 mg Start Lamictal induction 25 mg  Increase 25 mg every 5 days to 100 mg Continue CDIOP treatment plan  Maryjean Morn, PA-C 02/24/2017, 6:22 PM

## 2017-02-26 ENCOUNTER — Encounter (HOSPITAL_COMMUNITY): Payer: Self-pay | Admitting: Psychology

## 2017-02-26 ENCOUNTER — Other Ambulatory Visit (HOSPITAL_COMMUNITY): Payer: Self-pay | Admitting: Psychology

## 2017-02-26 DIAGNOSIS — F1520 Other stimulant dependence, uncomplicated: Secondary | ICD-10-CM

## 2017-02-26 NOTE — Progress Notes (Signed)
    Daily Group Progress Note  Program: CD-IOP   02/26/2017 Eilene Ghazi [id number]  Diagnosis:  Methamphetamine use disorder, severe, dependence ([place])  Alcohol abuse with intoxication ([place])  Major depressive disorder, recurrent episode, moderate (HCC)  Generalized anxiety disorder  Human immunodeficiency virus (HIV) disease ([place])  Polysubstance abuse  Obsessive-compulsive behavior  Medication dose too high based on hepatic function - Prozac  Medication management   Sobriety Date: 6/21  Group Time: 1-2:30  Participation Level: Active  Behavioral Response: Appropriate and Sharing  Type of Therapy: Process Group  Interventions: CBT, Solution Focused and Supportive  Topic: Patients were active and engaged for process session. They shared openly about their weekends, challenges and successes in their journeys of recovery. Pts were asked to highlight skills and techniques they have learned to utilize outside of the therapy room. Some patients met with Darlyne Russian, Medical director to discuss medications and gene sight test results.      Group Time: 2:30-4  Participation Level: Active  Behavioral Response: Appropriate and Sharing  Type of Therapy: Psycho-education Group  Interventions: Psychosocial Skills: Increasing Emotional Vocabulary  Topic: Patients were active and engaged for psychoeducation session. Pts were given a handout on "feelings vs. thoughts" and asked to discuss its talking points. Pts were then led in a experiential game of "Feelings Jenga" in which pts cooperatively discuss different feeling states in recovery and in active addiction. Some UDS results were return and members were asked to explain some inconsistent results, which they did.   Summary: Pt was somewhat active and engaged in session. He presented as upbeat and more positive than previous sessions. He reported he attended 2 AA meetings in [place] over the weekend. He  reported on a shining moment when he felt the urge to "say something snarky to his mother when she was crying" but instead let his mother "have her emotion and not make it worse". Pt stated he applied to 5 more jobs this weekend and has yet to hear back from any jobs. He reported he has officially moved out of his old apartment and no longer has a key which makes him feel "sad but also hopeful".    UDS collected: Yes Results: negative  AA/NA attended?: YesFriday and Saturday  Sponsor?: No   Youlanda Roys, LPCA LCASA 02/26/2017 11:54 AM

## 2017-02-27 ENCOUNTER — Encounter (HOSPITAL_COMMUNITY): Payer: Self-pay | Admitting: Psychology

## 2017-02-27 ENCOUNTER — Other Ambulatory Visit (HOSPITAL_COMMUNITY): Payer: Self-pay | Admitting: Psychology

## 2017-02-27 ENCOUNTER — Encounter (HOSPITAL_COMMUNITY): Payer: Self-pay | Admitting: Licensed Clinical Social Worker

## 2017-02-27 DIAGNOSIS — F1520 Other stimulant dependence, uncomplicated: Secondary | ICD-10-CM

## 2017-02-27 DIAGNOSIS — F411 Generalized anxiety disorder: Secondary | ICD-10-CM

## 2017-02-27 DIAGNOSIS — F331 Major depressive disorder, recurrent, moderate: Secondary | ICD-10-CM

## 2017-02-27 NOTE — Progress Notes (Signed)
    Daily Group Progress Note  Program: CD-IOP   02/27/2017 [name] [id number]  Diagnosis:  No diagnosis found.   Sobriety Date: 12/26/16  Group Time: 1-2:30pm  Participation Level: Active  Behavioral Response: Sharing  Type of Therapy: Process Group  Interventions: Supportive  Topic: Process: the first half of group was spent in process. Members shared about any successes or challenges to their sobriety. A number of group members laughed as they completed their quick check-in, stating that they had many 'speed bumps' to discuss. One member arrived about 10 minutes late and was very disruptive as he settled in. as the session progressed, it was clear this late-arriving group member was impaired. When questioned about his presentation, the patient denied any use and explained he had not slept and was tired. A few minutes later, the counselor asked the patient to accompany her outside. The session continued with good feedback among members.  Group Time: 2:30-4pm  Participation Level: Active  Behavioral Response: Sharing  Type of Therapy: Psycho-education Group  Interventions: Solution Focused  Topic: Psycho-Ed: Anger. The second half of group was spent in a psycho-ed on the subject of anger. A handout was provided and members took turns reading it and discussing its contents. Member shared about their own experiences in childhood with anger and their efforts to avoid confront or the results of parental anger. Members shared about certain issues or topics that are particularly arousing and discussed ways to change perception. Drug tests were collected from four group members.  Summary: The patient reported his 'shining moment' was picking up his 60-day chip. The group applauded this news. Another speed bump was his mother bringing home an apartment magazine with all of the rental listings. He was hoping that meant he would have his own place sometime soon. His speed bump  was worrying about his mother. Her niece has struggled with addiction, but is asking for money to pay her legal bills. The patient explained he doesn't trust her and wonders if the money is going to her drug use? When questioned as to why he might be angry or mistrusting of her, the patient seemed surprised when the counselor wondered if he feels that way because he took advantage of his mother for so long? The patient reported he had never thought of it that way (despite the seemingly obvious similarities). In the psycho-ed, the patient reported he had checked-out when his stepfather got angry. "It was a matter of survival', he explained to the group. "I acted like it didn't bother me", he continued. He also learned to shut down and ignore the bullying he received in elementary and middle school. When questioned where that fear and anger went, he had little insight into what he might have done with that underlying reaction to the trauma. He was able to identify that he played video games and removed himself to a 'fantasy world' to avoid the emotional pain and fear of his abuse. The patient seemed somewhat reluctant to share unless invited by the counselor, but he eventually revealed some important and revealing information. He responded well to this intervention.    UDS collected: No Results:   AA/NA attended?: YesTuesday  Sponsor?: No   Charmian Muff, LCAS 02/27/2017 8:35 AM

## 2017-02-27 NOTE — Progress Notes (Signed)
   CDIOP INDIVIDUAL SESSION THERAPIST PROGRESS NOTE  Session Time: 12-1pm  Participation Level: Active  Behavioral Response: Casual and NeatAlertEuthymic  Type of Therapy: Individual Therapy  Treatment Goals addressed: Anxiety and Diagnosis: MDD Methamphetamin Use Disorder  Interventions: CBT, Supportive and Reframing  Summary: Jimmy Davis is a 36 y.o. male who presents for weekly 1 hour individual therapy as part of CDIOP. He reports he has been feeling somewhat "anxious and nervous today" since he did not do his homework. Pt smiles and laughs nervously when he says this to counselor. Counselor inquires why pt was unable to complete hw and pt responds that he "kind of did it but did it in his head". Pt described 2 goals of walking at least 30 min daily and also eating one healthy meal / day. Counselor encouraged pt on these goals and helped him make goals more specific and measurable. Counselor told pt he often feels that pt is "mostly talk and not too interested in making changes". Pt got quiet and seemed flushed in his cheeks. Counselor asked pt how he felt towards counselor right now and pt responded he felt that "counselor was going in the for the kill". Counselor asked pt to stay w/ this feeling and explore the experience of feeling upset, hurt, sad, angry, and let down by receiving criticism. Counselor help reframe "positive criticism" as different than bullying or belittling. Pt agreed and said he has been unable to think of criticism in a positive way but will try to in the future. Counselor and pt discuss measurable goals of making an appointment w/ a [place] dietician, Interviewing w/ at least 2 jobs within 30 days, securing a sponsor by 03/08/17, and identifying and contacting 2 housing options before 03/08/17. Pt agreed to all these updated goals and signed a behavioral contract stating he would commit to this.   Suicidal/Homicidal: Nowithout intent/plan  Therapist Response: Pt  continues to show engagement and courage in session and group by revealing his immediate emotional response and confronting conflict more directly that he has in the past.   Plan: Return again in 1 weeks.  Diagnosis:    ICD-10-CM   1. 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 Major depressive disorder, recurrent episode, moderate (HCC) F33.1   3. Generalized anxiety disorder F41.1   .    Margo Common, LCAS-A 02/27/2017

## 2017-02-28 ENCOUNTER — Encounter (HOSPITAL_COMMUNITY): Payer: Self-pay | Admitting: Psychology

## 2017-02-28 NOTE — Progress Notes (Signed)
    Daily Group Progress Note  Program: CD-IOP   02/28/2017 Jimmy Davis [id number]  Diagnosis:  Methamphetamine use disorder, severe, dependence (HCC)  Major depressive disorder, recurrent episode, moderate (HCC)  Generalized anxiety disorder   Sobriety Date: 6/21  Group Time: 1-2:30  Participation Level: Active  Behavioral Response: Appropriate and Sharing  Type of Therapy: Process Group  Interventions: CBT, Solution Focused, Strength-based and Supportive  Topic: Patients were active and engaged for group process session. Counselors used open questions and reflection to help pts share about their challenges and successes in early recovery. Pts shared about their tx goals of sustaining sobriety and growing social support for recovery. One pt shared about his relapse yesterday and apologized to group for attending high and lying about his sobriety. Pts shared openly in a topical discussion that centered on differing recovery and 12 Step topics.      Group Time: 2:30-4  Participation Level: Active  Behavioral Response: Appropriate and Sharing  Type of Therapy: Psycho-education Group  Interventions: CBT and Psychosocial Skills: Shame and doubt  Topic: Patients were active and engaged for group psychoeducation session. Counselors led a psychoeducation lesson on "healing from shame" utilizing a video from [employer] and group discussion. Pts were reflective and thoughtful in their discussion and were able to vocalize significant understanding of the power shame plays in active addiction.    Summary: Pt was active and engaged in session. He reported he attended no AA meetings since yesterday. He discussed his behavioral commitments that he had just discussed in individual session. Counselor gave him a Nurse, adult which included treatment goals of walking daily, interviewing for 2 jobs, finding 2 locations for housing, and securing a sponsor within 2 weeks. Pt  discussed the topic of "self care' which he admited was very challenging for him w/o using meth. Pt watched video on shame and vulnerability actively and commented on the power of changing the way he thinks about vulnerability.    UDS collected: No Results: negative  AA/NA attended?: No  Sponsor?: No  Dorann Lodge, LPCA LCASA 02/28/2017 12:59 PM

## 2017-03-03 ENCOUNTER — Other Ambulatory Visit (HOSPITAL_COMMUNITY): Payer: Self-pay | Admitting: Psychology

## 2017-03-03 ENCOUNTER — Encounter: Payer: Self-pay | Admitting: Psychiatry

## 2017-03-03 DIAGNOSIS — F1520 Other stimulant dependence, uncomplicated: Secondary | ICD-10-CM

## 2017-03-03 DIAGNOSIS — F331 Major depressive disorder, recurrent, moderate: Secondary | ICD-10-CM

## 2017-03-04 ENCOUNTER — Encounter (HOSPITAL_COMMUNITY): Payer: Self-pay | Admitting: Psychology

## 2017-03-04 NOTE — Progress Notes (Signed)
    Daily Group Progress Note  Program: CD-IOP   03/04/2017 Jimmy Davis [id number]  Diagnosis:  No diagnosis found.   Sobriety Date: 12/26/16  Group Time: 1-2:30pm  Participation Level: Active  Behavioral Response: Sharing  Type of Therapy: Process Group  Interventions: Supportive  Topic: Process: The first half of group was spent in process. Members shared about their challenges and successes over the past weekend. Two new members were present in group today and they introduced themselves and shared about the circumstances that had brought them here. One group member disclosed that he had used over the weekend. His sobriety date was today. He had been using last week and had been taken out of group last Wednesday. His return to use was discussed at length. The medical director met with one of the new group members and a member who will be discharging later this week. Five drug tests were collected.   Group Time: 2:30-4pm  Participation Level: Active  Behavioral Response: Sharing  Type of Therapy: Psycho-education Group  Interventions: Family Systems  Topic: Psycho-Ed: Family Roles in Addicted/Dysfunctional Families. The second half of group was spent in a psycho-ed. A handout identifying the different family roles was provided and a discussion followed. Members took turns reading the description of each of the five most commonly identified roles that family members take on. Members identified themselves and explained how and why they may have undertaken these roles and what they had hoped to achieve. There was good disclosure among group members and the two new group members shared at length about their roles their family of origin.   Summary:The patient reported that his shining moment was ordering a healthy food choice for dinner the other night. He also identified having a haircut as a positive moment. This news was applauded, as he has been short on engaging in  positive self-care. The patient's speed bump was a comment his mother made when she and his sister went out for the day. He had enjoyed being at home by himself, but he reported his mother had called inquiring about his well-being. Before they ended their conversation, the patient stated his mother said, "don't get into any trouble'. He had found this deflating and very negative - almost planting a seed. The patient admitted he had just gone to bed instead of thinking about it any longer. In the psycho-ed, the patient identified his mother as being an enabler with all of the money she has given him. The patient identified himself as being a bit of the mascot, taking on a bit of a comedic style. He also pointed out that 'public achievement' was very important to his family and appearances were always emphasized. The patient shared about himself, but he seemed to share about seemingly shallow issues in the psycho-ed. This counselor is questioning whether he is reluctant to disclose his lack of behavioral changes that he signed a contract to follow. It remains to be seen what will unfold in the days ahead.    UDS collected: Yes Results: pending  AA/NA attended?: YesFriday and Saturday  Sponsor?: No   Brandon Melnick, LCAS 03/04/2017 12:08 PM

## 2017-03-05 ENCOUNTER — Encounter (HOSPITAL_COMMUNITY): Payer: Self-pay | Admitting: Psychology

## 2017-03-05 ENCOUNTER — Other Ambulatory Visit (HOSPITAL_COMMUNITY): Payer: Self-pay | Admitting: Psychology

## 2017-03-05 DIAGNOSIS — F411 Generalized anxiety disorder: Secondary | ICD-10-CM

## 2017-03-05 DIAGNOSIS — F1520 Other stimulant dependence, uncomplicated: Secondary | ICD-10-CM

## 2017-03-05 DIAGNOSIS — F331 Major depressive disorder, recurrent, moderate: Secondary | ICD-10-CM

## 2017-03-05 NOTE — Progress Notes (Signed)
    Daily Group Progress Note  Program: CD-IOP   03/05/2017 Jimmy Davis [id number]  Diagnosis:  Methamphetamine use disorder, severe, dependence (HCC)  Major depressive disorder, recurrent episode, moderate (HCC)  Generalized anxiety disorder   Sobriety Date: 6/21  Group Time: 1-2:30  Participation Level: Active  Behavioral Response: Appropriate and Sharing  Type of Therapy: Process Group  Interventions: CBT, Motivational Interviewing and Supportive  Topic: Patients were active and engaged in group today. Patients shared about their challenges and successes in early recovery. Some UDS results were collected from pts and some results were returned. Pts focused on sharing their interpersonal conflicts w/ family members. One member was not present after being challenged on Monday to consider a higher level of care. Group members process his absence and reflected on their here-and-now feelings. Two new employees of [employer] as "peer support specialists" were present and listened actively in group to learn more about the service of CDI[employer].     Group Time: 2:30-4  Participation Level: Active  Behavioral Response: Appropriate and Sharing  Type of Therapy: Psycho-education Group  Interventions: CBT and Family Systems  Topic: Patients were active and engaged in group psychoeducation today. Counselors guided a discussion on family roles and took a deeper dive into the way family dynamics show up in the group therapy room. Pts were encouraged to share their thoughts and reflections on family roles as they appear in group, including "Hero, enabler, scapegoat, mascot, and lost child".    Summary: Pt was active and engaged today. He reported he got a sponsor and will be meeting w/ him regularly. Pt was challenged by counselor to "open up more to group" since counselor often feels "disconnected" from this member in session. Pt reported he found the challenge helpful and  supportive. Pt received praise and feedback for his vulnerability in group and admitted he was struggling w/ "punishing himself" in recovery since he felt guilty about his addiction. Pt admitted his mother's recent "upset feelings' caused him to feel upset and counselor discussed "codependency" w/ pt and the group.    UDS collected: No Results: negative  AA/NA attended?: YesTuesday and Wednesday  Sponsor?: Yes   Dorann LodgeWes Margrit Minner, LPCA LCASA 03/05/2017 5:21 PM

## 2017-03-06 ENCOUNTER — Other Ambulatory Visit (HOSPITAL_COMMUNITY): Payer: Self-pay | Admitting: Psychology

## 2017-03-06 DIAGNOSIS — F331 Major depressive disorder, recurrent, moderate: Secondary | ICD-10-CM

## 2017-03-06 DIAGNOSIS — F411 Generalized anxiety disorder: Secondary | ICD-10-CM

## 2017-03-06 DIAGNOSIS — F1520 Other stimulant dependence, uncomplicated: Secondary | ICD-10-CM

## 2017-03-11 ENCOUNTER — Encounter (HOSPITAL_COMMUNITY): Payer: Self-pay | Admitting: Psychology

## 2017-03-11 NOTE — Progress Notes (Signed)
    Daily Group Progress Note  Program: CD-IOP   03/11/2017 Jimmy Davis [id number]  Diagnosis:  Methamphetamine use disorder, severe, dependence (HCC)  Major depressive disorder, recurrent episode, moderate (HCC)  Generalized anxiety disorder   Sobriety Date: 6/21  Group Time: 1-2:30  Participation Level: Active  Behavioral Response: Appropriate and Sharing  Type of Therapy: Process Group  Interventions: CBT, Motivational Interviewing and Reframing  Topic: Patients were active and engaged in group process session led by counselors. Pts were asked to share about their struggles and successes in early recovery from mind-altering chemicals. One pt was absent and had previously notified counselors of a conflicting medical appointment. One member graduated successfully and received feedback and support from other group members. Pts shared in a topical discussion of varying topics related to Recovery, 12 Steps, and sobriety. UDS results were returned to some pts.      Group Time: 2:30-4  Participation Level: Active  Behavioral Response: Appropriate and Sharing  Type of Therapy: Psycho-education Group  Interventions: CBT and Reframing  Topic: Patients were active and engaged in group psychoeducation session led by counselors. Pts were attentive during a presentation by a former group member who presented on his time since graduating from [facility] 1 yr ago. He reported on having 1 yr of sobriety and varying challenges in early recovery. Pts asked questions and verbalized receiving support and encouragement from the guest speaker.    Summary: Patient was active and engaged in session today. He reported he did not attend any AA/NA since yesterday. He discussed his relationship to his own "higher power" in recovery and how he considers himself a [place] but does not "really know what he believes about that". Pt shared more emotionally and intimately than previous groups which  is an apparent response to being challenged in the last group to share more of himself w/ others when he checks in. Pt used feelings words "scared, nervous, and sad" which the group gave him positive feedback about. Pt gave helpful and supportive feedback to graduating member and listened attentively to guest speaker.    UDS collected: No Results: negative  AA/NA attended?: No  Sponsor?: Yes   Wes Swan, LPCA LCASA 03/11/2017 2:32 PM

## 2017-03-12 ENCOUNTER — Other Ambulatory Visit (HOSPITAL_COMMUNITY): Payer: Self-pay | Attending: Psychiatry | Admitting: Psychology

## 2017-03-12 DIAGNOSIS — B20 Human immunodeficiency virus [HIV] disease: Secondary | ICD-10-CM

## 2017-03-12 DIAGNOSIS — F331 Major depressive disorder, recurrent, moderate: Secondary | ICD-10-CM

## 2017-03-12 DIAGNOSIS — F1520 Other stimulant dependence, uncomplicated: Secondary | ICD-10-CM | POA: Insufficient documentation

## 2017-03-12 DIAGNOSIS — F411 Generalized anxiety disorder: Secondary | ICD-10-CM

## 2017-03-13 ENCOUNTER — Other Ambulatory Visit (HOSPITAL_COMMUNITY): Payer: Self-pay | Admitting: Psychology

## 2017-03-13 DIAGNOSIS — F1520 Other stimulant dependence, uncomplicated: Secondary | ICD-10-CM

## 2017-03-13 DIAGNOSIS — F331 Major depressive disorder, recurrent, moderate: Secondary | ICD-10-CM

## 2017-03-14 ENCOUNTER — Encounter (HOSPITAL_COMMUNITY): Payer: Self-pay | Admitting: Psychology

## 2017-03-14 ENCOUNTER — Other Ambulatory Visit (HOSPITAL_COMMUNITY): Payer: Self-pay

## 2017-03-14 NOTE — Progress Notes (Signed)
    Daily Group Progress Note  Program: CD-IOP   03/14/2017 Jimmy Davis [id number]  Diagnosis:  Methamphetamine use disorder, severe, dependence (HCC)  Major depressive disorder, recurrent episode, moderate (HCC)  Generalized anxiety disorder  Human immunodeficiency virus (HIV) disease (HCC)   Sobriety Date: 6/21  Group Time: 1-2:30  Participation Level: Active  Behavioral Response: Appropriate and Sharing  Type of Therapy: Process Group  Interventions: CBT, Strength-based, Supportive and Reframing  Topic: Pts were active and engaged for process group. Therapist led discussion on pts' recent challenges and successes in early recovery. Counselor encouraged pt to discuss skills they had employed to combat relapse and avoid triggers/cravings to use. UDS were collected from some members. One member became upset when FMLA paperwork could not be signed in the time period she was requesting. She became agitated in group but eventually became less activated. Pts shared openly and offered each other supportive feedback.      Group Time: 2:30-4  Participation Level: Active  Behavioral Response: Appropriate and Sharing  Type of Therapy: Psycho-education Group  Interventions: CBT  Topic: Pts were active and engaged for psychoeducation group. Therapist led discussion on utilizing a "though record" to gain awareness of triggering situations. Thoughts, behaviors, and emotions were discussed and counselor helped pt find "alternative thoughts" to help cope w/ distress. Pts were provided w/ a handout and asked to fill out the appropriate columns.    Summary: Pt was active and engaged in session. He shared about behavioral changes he was making including walking daily and applying for jobs and looking for apartments. Counselor asked pt if he would consider living in an [facility] before getting in his own apartment. The group was supportive of this choice and helped pt explore the  benefits of living w/ other sober people temporarily. Pt reported he attended 4 AA meetings since last group. He spoke w/ his sponsor and felt encouraged by the support he was getting.    UDS collected: Yes Results: negative 9/6  AA/NA attended?: YesThursday, Friday and Saturday  Sponsor?: Yes   Dorann LodgeWes Swan, LPCA LCASA 03/14/2017 3:11 PM

## 2017-03-14 NOTE — Progress Notes (Signed)
    Daily Group Progress Note  Program: CD-IOP   03/14/2017 Jimmy Davis [id number]  Diagnosis:  No diagnosis found.   Sobriety Date: 12/26/16  Group Time: 1-2:30pm  Participation Level: Active  Behavioral Response: Sharing  Type of Therapy: Process Group  Interventions: Supportive  Topic: Process: The first half of group was spent in process. Members shared about any speed bumps or shining moments they had experienced since we last met. A new group member was present and she introduced herself. Drug tests were collected from four group members.   Group Time: 2:30-4pm  Participation Level: Active  Behavioral Response: Sharing  Type of Therapy: Psycho-education Group  Interventions: Strength-based  Topic: Psycho-ed: Popsicle Sticks/Graduation. Members took a Popsicle stick and discussed what the word or phrase written on their stick meant to them relative to their recovery. Members provided feedback and their own perspective e on each other's word. As the session neared the end, a graduation ceremony was held honoring a graduating member. A former group member (who had graduated two sessions ago) appeared, as did the graduating member's husband. Kind words and tears were shed as members spoke highly of the member moving into this next phase of her new and sober life.   Summary: The patient identified his shining moment as walking with his mother. They walked about a mile last night. He admitted that his speed bump was looking at a small mirror that his mother had on the counter and seeing something that looked like powder, meth or cocaine powder on the edges. The good news was that he did not rub his finger and taste it. The patient attributed his resistance to a recent increase in his baclofen medication. Another speed bump was his sponsor asking that he take on service work by meeting with him 45 minutes before their weekly home group meeting and helping him make the coffee.  The patient questioned whether he wanted to give up that much time, while his fellow group members pointed out the benefits of meeting early. The patient's word was "powerlessness". He admitted that he hates the idea of being a slave to his disease. He also noted that with more than 60 days of sobriety, he feels less chained than in the past. The patient had kind words for the graduating member and described her as one of the kindest people he had ever met. The patient has still not secured any sort of employment and how he spends his time is still very questionable. We will continue to follow closely in the weeks ahead.    UDS collected: No Results:   AA/NA attended?: No  Sponsor?: Yes   Brandon Melnick, LCAS 03/14/2017 8:55 AM

## 2017-03-17 ENCOUNTER — Other Ambulatory Visit (HOSPITAL_COMMUNITY): Payer: Self-pay | Admitting: Psychology

## 2017-03-17 DIAGNOSIS — F1520 Other stimulant dependence, uncomplicated: Secondary | ICD-10-CM

## 2017-03-17 DIAGNOSIS — F331 Major depressive disorder, recurrent, moderate: Secondary | ICD-10-CM

## 2017-03-18 ENCOUNTER — Other Ambulatory Visit (HOSPITAL_COMMUNITY): Payer: Self-pay

## 2017-03-18 ENCOUNTER — Encounter: Payer: Self-pay | Admitting: Internal Medicine

## 2017-03-19 ENCOUNTER — Other Ambulatory Visit (HOSPITAL_COMMUNITY): Payer: Self-pay | Admitting: Psychology

## 2017-03-19 DIAGNOSIS — F331 Major depressive disorder, recurrent, moderate: Secondary | ICD-10-CM

## 2017-03-19 DIAGNOSIS — F1520 Other stimulant dependence, uncomplicated: Secondary | ICD-10-CM

## 2017-03-20 ENCOUNTER — Encounter (HOSPITAL_COMMUNITY): Payer: Self-pay | Admitting: Licensed Clinical Social Worker

## 2017-03-20 ENCOUNTER — Encounter (HOSPITAL_COMMUNITY): Payer: Self-pay | Admitting: Psychology

## 2017-03-20 ENCOUNTER — Other Ambulatory Visit (HOSPITAL_COMMUNITY): Payer: Self-pay | Admitting: Psychology

## 2017-03-20 DIAGNOSIS — F1520 Other stimulant dependence, uncomplicated: Secondary | ICD-10-CM

## 2017-03-20 DIAGNOSIS — F411 Generalized anxiety disorder: Secondary | ICD-10-CM

## 2017-03-20 DIAGNOSIS — F331 Major depressive disorder, recurrent, moderate: Secondary | ICD-10-CM

## 2017-03-20 NOTE — Progress Notes (Signed)
    Daily Group Progress Note  Program: CD-IOP   03/20/2017 Eilene Ghazi [id number]  Diagnosis:  No diagnosis found.   Sobriety Date: 6/21  Group Time: 1-2:30pm  Participation Level: Active  Behavioral Response: Sharing  Type of Therapy: Process Group  Interventions: Supportive  Topic: Process: The first half of group was spent in process. Members shared about their challenges and victories in early recovery. Two new group members were present today and they introduced themselves during this half of the group session. The medical director met with one of the new group members and a member who will be discharging and graduating tomorrow. Two drug tests were collected today.  Group Time: 2:30-4pm  Participation Level: Active  Behavioral Response: Sharing  Type of Therapy: Psycho-education Group  Interventions: Strength-based  Topic: Psycho-Ed: Communication; Part II. The second half of group was spent in a psycho-ed. It was a continuation of Monday's group session on communication. The handout was provided to the new group members and the discussion about this important topic continued. Members engaged and shared openly about their own struggles with communication and some offered concrete examples that were dissected by the group. The new group members were very engaged and shared about some of their more intense and personal struggles.  Summary: The patient reported he had attended an NA meeting at noon and met two other group members there. It had been a good meeting and he has seen a friend he had known when he was sober before. However, his speed bump was that he drove by one of his old drug dealer's home and it had made him a little queasy. During his disclosures, the patient was asked if he had deleted all of his drug dealer's phone numbers from is phone - he admitted he had not deleted one of them. The group got on him, gently, and he was encouraged to eliminate the  number. Other members noted that having their dealer's phone # would lead to using. He made some excuses, but eventually admitted, "It probably wasn't good". In the psycho-ed, the patient reported he felt as if the assertiveness training had been very helpful for him. He was able to provide an example that he had used in his own life. The patient admitted that he has avoided conflict, something he learned in his childhood, but believes he will become more assertive in the days ahead. The patient made some helpful comments, but his reluctance to make seemingly obvious changes and eliminate blatant temptations are very troubling. The patient is a scholarship recipient and has been here long beyond the usual timeframe. It is unclear whether he is progressing proportional to the support, energy and opportunity provided him.   UDS collected: No Results:   AA/NA attended?: YesTuesday and Wednesday  Sponsor?: Yes   Brandon Melnick, LCAS 03/20/2017 9:26 AM

## 2017-03-21 ENCOUNTER — Encounter (HOSPITAL_COMMUNITY): Payer: Self-pay | Admitting: Licensed Clinical Social Worker

## 2017-03-21 ENCOUNTER — Encounter (HOSPITAL_COMMUNITY): Payer: Self-pay | Admitting: Psychology

## 2017-03-21 ENCOUNTER — Other Ambulatory Visit (HOSPITAL_COMMUNITY): Payer: Self-pay

## 2017-03-21 NOTE — Progress Notes (Signed)
   THERAPIST PROGRESS NOTE  Session Time: 12-1pm  Participation Level: Active  Behavioral Response: Neat and Well GroomedAlertAnxious, Worthless and tearful  Type of Therapy: Individual Therapy  Treatment Goals addressed: Anxiety and Diagnosis: SUD  Interventions: CBT, Supportive and Reframing  Summary: Jimmy Davis is a 36 y.o. male who presents with Methamphetamine Use Disorder, anxiety, and depression. He reports his week has been good and he has not had any significant cravings or thoughts to use meth or other substances. Counselor and pt discuss pt's hx of "feeling inferior or defective" in some way. Counselor used CBt to help pt see that pt lacked basic male support growing up and therefore is unsure of how to "be a man" today. Pt admitted he wants to "be a strong manly man" but does not know how. Counselor reflected that pt has never gotten support from anyone but his mother and thus has a "learned dependency" on her. Pt became defensive but agreed that he "uses his mother" more than he prefers to. He became tearful and admitted that he feels he has let his mother down and that he has ruined his life w/ meth. Counselor challenged this belief and helped pt to come up w/ evidence to the contrary which pt was able to do. Pt eventually started using the metaphor of having a "wall" around his heart which keeps him from experiencing both negative and positive emotions. Pt stated that in his past when he has tried to open up to others he gets "used, abused, and taken advantaged of". Counselor validated past traumatic experiences and helped him to reprocess in the here-and-now.   Counselor discussed that pt's d/c date would be 1 week from today, which marks 90 days in his tx w/ this team.   Suicidal/Homicidal: Nowithout intent/plan  Therapist Response: Counselor used supportive open questions, validating language to help pt discuss his trauma w/o getting retraumatized and experiencing a sense  of being able to "unpack it for a bit and then put it back away".  Plan: Return again in 1 weeks.  Diagnosis:    ICD-10-CM   1. 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 Major depressive disorder, recurrent episode, moderate (HCC) F33.1   3. Generalized anxiety disorder F41.1        Margo Common, LCAS-A 03/21/2017

## 2017-03-21 NOTE — Progress Notes (Signed)
    Daily Group Progress Note  Program: CD-IOP   03/21/2017 [name] [id number]  Diagnosis:  Methamphetamine use disorder, severe, dependence (HCC)  Major depressive disorder, recurrent episode, moderate (HCC)  Generalized anxiety disorder   Sobriety Date: 6/21  Group Time: 1-2:30  Participation Level: Active  Behavioral Response: Appropriate and Sharing  Type of Therapy: Process Group  Interventions: CBT, Solution Focused, Strength-based, Supportive and Reframing  Topic: Patients were active and engaged for group process session. UDS results were returned to some individuals. Pts were guided by counselors in sharing about their challenges and successes in early recovery. Pts encouraged each other and offered helpful and supportive feedback to the group. Two new members increased their disclosures to group and appear to be fitting into group well. One member graduated successfully and was provided feedback and a remembrance token.     Group Time: 2:30-4  Participation Level: Active  Behavioral Response: Appropriate and Sharing  Type of Therapy: Psycho-education Group  Interventions: CBT  Topic: Patients were active and engaged in group psychoeducation session. Pts were led in a topical discussion about recovery, 12 steps, PAWS symptoms, and other early recovery topics. Pts shared their thoughts and questions about the topics and asked the group for feedback and help.    Summary: Patient was active and engaged in session. He reported on his positive individual session he had just had. Pt stated he realizes that he has "put up a wall" around his heart for protection but that this also keeps him from being able to be hopeful. Pt reported he wants to work on "chiping away at the way" and being more vulnerable. Pt shared that he feels he is growing and knows that challenges are not "failures but ways to grow stronger".    UDS collected: No Results:  negative  AA/NA attended?: No Sponsor?: Yes   Wes Swan, LPCA LCASA 03/21/2017 1:06 PM

## 2017-03-21 NOTE — Progress Notes (Signed)
    Daily Group Progress Note  Program: CD-IOP   03/21/2017 Jimmy Davis [id number]  Diagnosis:  No diagnosis found.   Sobriety Date: 12/26/16  Group Time: 1-2:30pm  Participation Level: Active  Behavioral Response: Appropriate  Type of Therapy: Process Group  Interventions: Supportive  Topic: Process: The first half of group was spent in process. Members identified 'speed bumps' and 'shining moments' that they experienced over the past weekend. A new member was present and he was asked to introduce himself to the group. The medical director met with two group members. Four random drug tests were collected.  Group Time: 2:30-4pm  Participation Level: Active  Behavioral Response: Sharing  Type of Therapy: Psycho-education Group  Interventions: Strength-based  Topic: Psycho-Ed: Guided Progressive Relaxation/Communication; Part 1.  The second half of group began with a guided progressive relaxation exercise. Almost everyone reported the exercise had been relaxing. A handout was provided on communication and identifying obstacles to communication. Group members shared as they read the handout and there was a good discussion and feedback among group members. Before the session ended, group members took the time to say thank you to a member who was discharging today. She had not completed the program, but had never intended to stay for the eight weeks choosing instead, to stay for four weeks. Members sung her praises and shared about her courage and willingness to be vulnerable.   Summary: The patient reported he had a busy weekend. He and his mother looked at apartments and he found one he really liked here in [place]. It is in the same complex that another group member lives in and it is a nice area. He reported attending two meetings and working on his step work and writing out how his life becomes unmanageable when he is using drugs. The patient admitted he saw a [employer] truck and  it was a trigger and reminded him of how he got his drug of addiction delivered. He remains concerned about getting a job, but upon further questioning by this counselor, he admitted he wasn't doing everything he could to secure employment. The patient reported he found the meditation 'relaxing' and he was able to find his "calm and centered place". In the psycho-ed on communication, the patient reported he felt very unworthy of goodness and it just fed into his low self-esteem. He has always  been passive in order to avoid conflict, but he is recognizing this just keeps him alone and lonely. The patient remains drug-free, but he seems stagnant and doesn't display the kind of motivation and intention one would anticipate after almost 90 days of sobriety. We will continue to follow closely in the days ahead.   UDS collected: Yes Results: negative  AA/NA attended?: YesFriday and Sunday  Sponsor?: Yes   Brandon Melnick, LCAS 03/21/2017 2:51 PM

## 2017-03-24 ENCOUNTER — Other Ambulatory Visit (HOSPITAL_COMMUNITY): Payer: Self-pay | Admitting: Psychology

## 2017-03-24 DIAGNOSIS — F331 Major depressive disorder, recurrent, moderate: Secondary | ICD-10-CM

## 2017-03-24 DIAGNOSIS — F1520 Other stimulant dependence, uncomplicated: Secondary | ICD-10-CM

## 2017-03-25 ENCOUNTER — Encounter (HOSPITAL_COMMUNITY): Payer: Self-pay | Admitting: Psychology

## 2017-03-25 ENCOUNTER — Other Ambulatory Visit (HOSPITAL_COMMUNITY): Payer: Self-pay

## 2017-03-25 NOTE — Progress Notes (Signed)
    Daily Group Progress Note  Program: CD-IOP   03/25/2017 Jimmy Davis [id number]  Diagnosis:  No diagnosis found.   Sobriety Date: 12/26/16  Group Time: 1-2:30pm  Participation Level: Active  Behavioral Response: Appropriate and Sharing  Type of Therapy: Process Group  Interventions: Supportive  Topic: Process: The first half of group was spent in process. Members shared about the past weekend and any challenges or victories they experienced in early recovery. the past weekend had been particularly dismal with [facility] blowing through the area. The medical director met with two group members during the session and drug tests were collected from all seven group members.   Group Time: 2:30-4pm  Participation Level: Active  Behavioral Response: Appropriate  Type of Therapy: Psycho-education Group  Interventions: Strength-based  Topic: Psycho-Ed: Pros and cons of Addiction versus Sobriety. The second half of group was spent in a psycho-ed. Members were asked to identify the pros and cons of active addiction versus the pros and cons of sobriety. The cons of active addiction were significant as were the pros of sobriety. It was noted that the benefits of using were short lived and more about immediate gratification while the pros or benefits of sobriety were more long term and lasting benefits.  Summary: The patient reported his shining moment was applying for more jobs over the weekend and having two interviews scheduled for tomorrow and Thursday. The group applauded this news. He also applied and received an 'invite' to an employment fair at [facility] on Saturday for holiday positions. The patient reported he feels as if his brain is beginning to move towards more balance and starting to 'normalize'. He explained that he is beginning to find things more fun or joyful. He admitted his speed bump was feeling self-conscious about his appearance. He has shared his  embarrassment about his weight and has begun walking in the mornings with his mother. However, he does not appear to have lost much if any weight and seems to be content eating a rather poor junk food diet. In the psycho-ed, the patient identified a pro of addiction as 'euphoria' and 'energy'. The cons included being paranoid, lack of sleep and 'loss of freedom'. He identified the pros of sobriety as regaining one's freedom, trust of family and regaining one's physical and mental health. The patient was energized and seemed more engaged and ready to get on with it. He will be graduating from the program later this week and it may be his upcoming graduation has motivated him to make some real efforts to making change. He responded well to this intervention.    UDS collected: Yes Results: pending  AA/NA attended?: YesFriday and Sunday  Sponsor?: Yes   Brandon Melnick, LCAS 03/25/2017 9:18 AM

## 2017-03-26 ENCOUNTER — Other Ambulatory Visit (INDEPENDENT_AMBULATORY_CARE_PROVIDER_SITE_OTHER): Payer: Self-pay | Admitting: Psychology

## 2017-03-26 ENCOUNTER — Encounter (HOSPITAL_COMMUNITY): Payer: Self-pay | Admitting: Medical

## 2017-03-26 DIAGNOSIS — E669 Obesity, unspecified: Secondary | ICD-10-CM

## 2017-03-26 DIAGNOSIS — F1910 Other psychoactive substance abuse, uncomplicated: Secondary | ICD-10-CM

## 2017-03-26 DIAGNOSIS — F411 Generalized anxiety disorder: Secondary | ICD-10-CM

## 2017-03-26 DIAGNOSIS — F331 Major depressive disorder, recurrent, moderate: Secondary | ICD-10-CM

## 2017-03-26 DIAGNOSIS — B20 Human immunodeficiency virus [HIV] disease: Secondary | ICD-10-CM

## 2017-03-26 DIAGNOSIS — F1520 Other stimulant dependence, uncomplicated: Secondary | ICD-10-CM

## 2017-03-26 MED ORDER — FLUOXETINE HCL 40 MG PO CAPS: 40.0000 mg | ORAL_CAPSULE | Freq: Every day | ORAL | 2 refills | Status: DC

## 2017-03-26 MED ORDER — LAMOTRIGINE 100 MG PO TABS: 100.0000 mg | ORAL_TABLET | Freq: Every day | ORAL | 2 refills | Status: DC

## 2017-03-26 MED ORDER — BACLOFEN 20 MG PO TABS: 20.0000 mg | ORAL_TABLET | Freq: Two times a day (BID) | ORAL | 2 refills | Status: DC
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
## 2017-03-27 ENCOUNTER — Other Ambulatory Visit (HOSPITAL_COMMUNITY): Payer: Self-pay | Admitting: Psychology

## 2017-03-27 ENCOUNTER — Encounter (HOSPITAL_COMMUNITY): Payer: Self-pay | Admitting: Licensed Clinical Social Worker

## 2017-03-27 DIAGNOSIS — B20 Human immunodeficiency virus [HIV] disease: Secondary | ICD-10-CM

## 2017-03-27 DIAGNOSIS — F411 Generalized anxiety disorder: Secondary | ICD-10-CM

## 2017-03-27 DIAGNOSIS — F331 Major depressive disorder, recurrent, moderate: Secondary | ICD-10-CM

## 2017-03-27 DIAGNOSIS — F1520 Other stimulant dependence, uncomplicated: Secondary | ICD-10-CM

## 2017-03-28 ENCOUNTER — Encounter (HOSPITAL_COMMUNITY): Payer: Self-pay | Admitting: Psychology

## 2017-03-28 NOTE — Progress Notes (Signed)
    Daily Group Progress Note  Program: CD-IOP   03/28/2017 Jimmy Davis [id number]  Diagnosis:  Methamphetamine use disorder, severe, dependence (HCC)  Polysubstance abuse  Human immunodeficiency virus (HIV) disease ([place])  Major depressive disorder, recurrent episode, moderate (HCC)  Generalized anxiety disorder  Obesity (BMI 30.0-34.9)   Sobriety Date: 6/21  Group Time: 1-2:30  Participation Level: Active  Behavioral Response: Appropriate and Sharing  Type of Therapy: Process Group  Interventions: CBT  Topic:Patients were attentive and engaged in group process session. Pts were asked to discuss their success and challenges in early recovery. 2 Pts were absent and excused. Counselors asked open, supportive questions to help pts discuss their coping skills and reframe negative experiences as challenges to be solved. Some pts met w/ medical director Darlyne Russian, PA to discuss medication management.     Group Time: 2:30-4  Participation Level: Active  Behavioral Response: Appropriate and Sharing  Type of Therapy: Psycho-education Group  Interventions: CBT  Topic: Patients were attentive and engaged in group psychoeducation session. Counselor led a 5 min mindfulness "concentration script" meditation and process afterward. Pts shared their experience w/ each other. Counselor then passed out a "[employer] describing the process of addiction. Topics discussed were substance use disorder patterns, loss of control of use, "hitting bottom", and the process of recovery. Pts were asked to create their own "curves" utilizing the model of [place]. Pts stated the exercise was helpful, gave them perspective, but was difficult to view all the negative consequences.    Summary: Patient was active and engaged in group. He reported he attended 2 AA meetings since last group. He reported on a job interview he had yesterday. He admitted he was nervous but felt  somewhat prepared since he researched typical interview questions online beforehand. Pt reported on some yard work he did at his mother's house which made him feel accomplished and proud. Pt was asked about his feelings about his upcoming d/c from program. Pt stated he felt "proud and nervous" that he may get emotional in session. Counselor and group normalized an emotional response to saying goodbye.    UDS collected: No Results: negative  AA/NA attended?: YesWednesday  Sponsor?: Yes   Youlanda Roys, LPCA LCASA 03/28/2017 12:45 PM

## 2017-03-31 ENCOUNTER — Encounter: Payer: Self-pay | Attending: Medical | Admitting: Dietician

## 2017-03-31 ENCOUNTER — Other Ambulatory Visit (HOSPITAL_COMMUNITY): Payer: Self-pay

## 2017-03-31 ENCOUNTER — Encounter: Payer: Self-pay | Admitting: Dietician

## 2017-03-31 DIAGNOSIS — B20 Human immunodeficiency virus [HIV] disease: Secondary | ICD-10-CM | POA: Insufficient documentation

## 2017-03-31 DIAGNOSIS — E669 Obesity, unspecified: Secondary | ICD-10-CM | POA: Insufficient documentation

## 2017-03-31 DIAGNOSIS — Z713 Dietary counseling and surveillance: Secondary | ICD-10-CM | POA: Insufficient documentation

## 2017-03-31 DIAGNOSIS — Z6833 Body mass index (BMI) 33.0-33.9, adult: Secondary | ICD-10-CM

## 2017-03-31 DIAGNOSIS — E639 Nutritional deficiency, unspecified: Secondary | ICD-10-CM | POA: Insufficient documentation

## 2017-03-31 DIAGNOSIS — Z6832 Body mass index (BMI) 32.0-32.9, adult: Secondary | ICD-10-CM | POA: Insufficient documentation

## 2017-03-31 DIAGNOSIS — E7800 Pure hypercholesterolemia, unspecified: Secondary | ICD-10-CM | POA: Insufficient documentation

## 2017-03-31 NOTE — Patient Instructions (Signed)
Rethink what you drink.  Consider more water and fewer diet soda. (Bone health, less sweet craving?, better hydration, less caffeine, better for your kidneys)  Aim for an active lifestyle.  Aim for 30-60 minutes most days.  Mindfulness  Choose mindfully  Slow your eating  Eat without distraction (away from TV, computers, phone, etc).  Stop when you are satisfied? Choose lean meats Limit or avoid fried foods Small amounts of protein with each meal Consider Jounaling.  Consider learning to cook.   Slow and steady.  (Perfect is not necessary.)

## 2017-03-31 NOTE — Progress Notes (Signed)
Medical Nutrition Therapy:  Appt start time: 1115 end time:  1220.   Assessment:  Primary concerns today: Patient is here today alone.  He would like to learn tips on how to lose weight.  He has counted calories before as well as high protein diet as well.  He lost 50-60 lbs the last time and has since regained the weight back.  He was referred for pure hypercholesterolemia, obesity, and nutrition deficiency.  Other hx includes depression and anxiety, 042, history of ETOH abuse and currently under treatment for methamphetamine addiction.  He reports that his body image is poor.  Labs:  Cholesterol 214, triglycerides 139, HDL 48, LDL 138.    Weight hx: Lowest adult weight 150 lbs Highest adult weight 260 lbs in high school (gave up sweet tea and soda) Today's weight 226  Patient is currently living with his mother.  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  He is going to move into his own apartment in November.  He does not know how to cook.  Currently he is eating out most often and when living alone was skipping meals due to poor appetite due to drug addiction and would eat a lot of sandwiches.  He states that he eats very quickly and is often excessively hungry when he starts to eat.  He is waking up currently and eating.  TANITA  BODY COMP RESULTS 03/31/17 236 lbs   BMI (kg/m^2) 33.9   Fat Mass (lbs) 90.6   Fat Free Mass (lbs) 145.4   Total Body Water (lbs) 110    Preferred Learning Style:   No preference indicated   Learning Readiness:   Ready  Change in progress   MEDICATIONS: see list   DIETARY INTAKE: Eating out most of the time.  He does not like vegetables.  24-hr recall:  B ( AM): cereal (cheerios sweetened), 2% milk Snk ( AM): none  L ( PM): fast food or sandwich Snk ( PM): none D ( PM): fast food (pizza or fried foods), ice cream or  smoothie Snk ( PM): wakes up and eats (leftover pizza) Beverages: diet soda (2 L daily) and occasional water, or unsweetened tea with artificial sweetener, occasional coffee cream and artificial sweetener, No etoh since 2015.  Usual physical activity: occasional walking.  Gym membership comes with his rent when he moves into his new apartment.  Finds walking soothing, yoga.  Prefers indoors. (Cardio and weight lifting last time he lost weight.)  Estimated energy needs: 1800 calories 120 g protein  Progress Towards Goal(s):  In progress.   Nutritional Diagnosis:  NB-1.1 Food and nutrition-related knowledge deficit As related to balanced eating.  As evidenced by diet hx and patient report.    Intervention:  Nutrition counseling/education related to healthy eating.  Discussed nutrition low in saturated fat and decreased added sugar and challenged him to continue to try vegetables prepared in different ways.  Discussed transition from diet soda to water.  Discussed healthy snacks to avoid cravings and importance of nourishing his body earlier in the day to avoid night snacking.  Discussed mindful eating.  Discussed the benefits of increased physical activity.  Rethink what you drink.  Consider more water and fewer diet soda. (Bone health, less sweet craving?, better hydration, less caffeine, better for your kidneys)  Aim for an active lifestyle.  Aim for 30-60  minutes most days.  Mindfulness  Choose mindfully  Slow your eating  Eat without distraction (away from TV, computers, phone, etc).  Stop when you are satisfied? Choose lean meats Limit or avoid fried foods Small amounts of protein with each meal Consider Jounaling.  Consider learning to cook. Slow and steady.  (Perfect is not necessary.)   Teaching Method Utilized:  Visual Auditory Hands on  Handouts given during visit include:  Meal plan card  My plate  Snack list  Barriers to learning/adherence to lifestyle  change: lack of cooking skills, food preferences  Demonstrated degree of understanding via:  Teach Back   Monitoring/Evaluation:  Dietary intake, exercise, and body weight in 2 week(s).

## 2017-04-01 NOTE — Progress Notes (Signed)
    Daily Group Progress Note  Program: CD-IOP   04/01/2017 [name] [id number]  Diagnosis:  Methamphetamine use disorder, severe, dependence (HCC)  Human immunodeficiency virus (HIV) disease (HCC)  Major depressive disorder, recurrent episode, moderate (HCC)  Generalized anxiety disorder   Sobriety Date: 6/21  Group Time: 1-2:30  Participation Level: Active  Behavioral Response: Appropriate and Sharing  Type of Therapy: Process Group  Interventions: CBT and Solution Focused  Topic:  Patients were active and engaged in group process session. Counselor led pts in a discussion about their challenges and successes in early recovery. Pts listened to each other attentively and provided each other with helpful and supportive feedback. Pts shared from a list of weekly topics related to recovery. One member graduated successfully and his parent was present for final 15 mins of group at the "graduation ceremony".      Group Time: 2:30-4  Participation Level: Active  Behavioral Response: Appropriate and Sharing  Type of Therapy: Psycho-education Group  Interventions: CBT, Strength-based and Reframing  Topic: Patients were active and engaged in group psychoeducation session. Counselor led pts in the "wheel of life" handout. Counselors posed 34 questions that allow pts to assess their life balance in areas of spirituality, finances, emotions, intellect, social, and physical health. Pts shared about their surprises and reactions to their wheel and created goals for further improvement in the future.   Summary: FINAL GROUP Pt was active and engaged in his final group today. He reported he did not attend any AA since yesterday. He stated he is feeling very hopeful but also nervous about leaving [facility]. He listened actively to other group members who provided him w/ feedback on his work in his nearly 50 sessions of group therapy. Pt reported he intends to attend Aftercare  group and follow up w/ individual counseling at this clinic to continue the work he has started in [facility].   UDS collected: No Results: negative  AA/NA attended?: No  Sponsor?: Yes   Jimmy Davis, LPCA LCASA 04/01/2017 4:55 PM

## 2017-04-02 ENCOUNTER — Ambulatory Visit (HOSPITAL_COMMUNITY): Payer: Self-pay | Admitting: Licensed Clinical Social Worker

## 2017-04-02 ENCOUNTER — Ambulatory Visit (INDEPENDENT_AMBULATORY_CARE_PROVIDER_SITE_OTHER): Payer: Self-pay | Admitting: Licensed Clinical Social Worker

## 2017-04-02 ENCOUNTER — Encounter (HOSPITAL_COMMUNITY): Payer: Self-pay | Admitting: Licensed Clinical Social Worker

## 2017-04-02 ENCOUNTER — Other Ambulatory Visit (HOSPITAL_COMMUNITY): Payer: Self-pay

## 2017-04-02 DIAGNOSIS — F331 Major depressive disorder, recurrent, moderate: Secondary | ICD-10-CM

## 2017-04-02 DIAGNOSIS — F1520 Other stimulant dependence, uncomplicated: Secondary | ICD-10-CM

## 2017-04-02 DIAGNOSIS — F411 Generalized anxiety disorder: Secondary | ICD-10-CM

## 2017-04-02 DIAGNOSIS — B20 Human immunodeficiency virus [HIV] disease: Secondary | ICD-10-CM
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
## 2017-04-03 ENCOUNTER — Other Ambulatory Visit (HOSPITAL_COMMUNITY): Payer: Self-pay

## 2017-04-04 ENCOUNTER — Encounter (HOSPITAL_COMMUNITY): Payer: Self-pay | Admitting: Licensed Clinical Social Worker

## 2017-04-04 NOTE — Progress Notes (Signed)
  Weekly Group Progress Note  Program: OUTPATIENT SKILLS GROUP  Group Time: 5:30-6:30pm  Participation Level: Minimal  Behavioral Response: Appropriate and Sharing  Type of Therapy:  Psycho-education Group  Skills discussed: Pros and Cons list making   Summary of Progress: Pt was moderate active in sharing in group for his first session and he listened attentively. He shared about his excitement for starting a job next week.   Summary of Group: Patients were active and engaged in process and psychoeducational session. Counselor led brief check-ins from pts to identify their current mood on a 1-10 scale, and to identify their goals for session today. Pts shared about their past week and commitments they had made, struggled w/, or changed due to circumstances. Two new pts were present and shared openly about their time since graduating from [facility] in the last 2 weeks. One pt became very emotional and discussed her fears since her panic attacks were "starting to return". The group offered helpful and supportive feedback.    Margo Common, LPCA, LCASA

## 2017-04-07 ENCOUNTER — Other Ambulatory Visit (HOSPITAL_COMMUNITY): Payer: Self-pay

## 2017-04-08 ENCOUNTER — Encounter: Payer: Self-pay | Admitting: Internal Medicine

## 2017-04-08 ENCOUNTER — Ambulatory Visit (INDEPENDENT_AMBULATORY_CARE_PROVIDER_SITE_OTHER): Payer: Self-pay | Admitting: Internal Medicine

## 2017-04-08 VITALS — BP 123/82 | HR 83 | Temp 98.8°F | Ht 70.0 in | Wt 233.0 lb

## 2017-04-08 DIAGNOSIS — Z113 Encounter for screening for infections with a predominantly sexual mode of transmission: Secondary | ICD-10-CM

## 2017-04-08 DIAGNOSIS — F324 Major depressive disorder, single episode, in partial remission: Secondary | ICD-10-CM

## 2017-04-08 DIAGNOSIS — B20 Human immunodeficiency virus [HIV] disease: Secondary | ICD-10-CM

## 2017-04-08 DIAGNOSIS — Z23 Encounter for immunization: Secondary | ICD-10-CM

## 2017-04-08 DIAGNOSIS — R635 Abnormal weight gain: Secondary | ICD-10-CM

## 2017-04-08 MED ORDER — BICTEGRAVIR-EMTRICITAB-TENOFOV 50-200-25 MG PO TABS: 1.0000 | ORAL_TABLET | Freq: Every day | ORAL | 11 refills | Status: DC

## 2017-04-08 NOTE — Progress Notes (Signed)
RFV: follow up for HIV disease  Patient ID: Jimmy Davis, male   DOB: [birth year], 36 y.o.   MRN: [id number]  HPI 36yo M with hiv disease, well controlled, 580/VL<20 in March 2018 on genvoya. 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 Overall doing well. He is going to be set up with new psychiatrist. Dr Katha Hamming to see in december  Outpatient Encounter Prescriptions as of 04/08/2017  Medication Sig  . acetaminophen (TYLENOL) 500 MG tablet Take 1 tablet (500 mg total) by mouth every 6 (six) hours as needed for mild pain or moderate pain.  . baclofen (LIORESAL) 20 MG tablet Take 1 tablet (20 mg total) by mouth 2 (two) times daily.  Marland Kitchen elvitegravir-cobicistat-emtricitabine-tenofovir (GENVOYA) 150-150-200-10 MG TABS tablet Take 1 tablet by mouth daily with breakfast.  . FLUoxetine (PROZAC) 40 MG capsule Take 1 capsule (40 mg total) by mouth daily.  Marland Kitchen lamoTRIgine (LAMICTAL) 100 MG tablet Take 1 tablet (100 mg total) by mouth daily.  . cetirizine (ZYRTEC) 10 MG tablet Take 1 tablet (10 mg total) by mouth daily. (Patient not taking: Reported on 04/08/2017)  . Multiple Vitamin (MULTIVITAMIN) capsule Take 1 capsule by mouth daily. (Patient not taking: Reported on 09/19/2016)  . [DISCONTINUED] aspirin 325 MG tablet Take 325 mg by mouth every 6 (six) hours as needed for mild pain.  . [DISCONTINUED] ondansetron (ZOFRAN ODT) 4 MG disintegrating tablet Take 1 tablet (4 mg total) by mouth every 8 (eight) hours as needed for nausea or vomiting. (Patient not taking: Reported on 01/02/2017)  . [DISCONTINUED] Sodium Chloride-Xylitol (XLEAR SINUS CARE SPRAY NA) Place 1 spray into the nose daily as needed (CONGESTION). Reported on 10/31/2015   No facility-administered encounter medications on file as of 04/08/2017.      Patient Active Problem List   Diagnosis Date  Noted  . Obesity (BMI 30.0-34.9) 03/26/2017  . Obsessive-compulsive behavior 12/13/2016  . Anxiety 12/11/2016  . Methamphetamine use disorder, severe, dependence (HCC) 12/09/2016  . Major depressive disorder, recurrent episode, moderate (HCC) 11/17/2015  . 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 Depression 10/03/2014  . Human immunodeficiency virus (HIV) disease (HCC) 06/29/2013     Health Maintenance Due  Topic Date Due  . TETANUS/TDAP  07/02/1998  . INFLUENZA VACCINE  02/05/2017     Review of Systems 12 point ros is negative except for weight gain Physical Exam   BP 123/82   Pulse 83   Temp 98.8 F (37.1 C)   Ht  (1.778 m)   Wt 233 lb (105.7 kg)   BMI 33.43 kg/m   Physical Exam  Constitutional: He is oriented to person, place, and time. He appears well-developed and well-nourished. No distress.  HENT:  Mouth/Throat: Oropharynx is clear and moist. No oropharyngeal exudate.  Cardiovascular: Normal rate, regular rhythm and normal heart sounds. Exam reveals no gallop and no friction rub.  No murmur heard.  Pulmonary/Chest: Effort normal and breath sounds normal. No respiratory distress. He has no wheezes.  Abdominal: Soft. Bowel sounds are normal. He exhibits no distension. There is no tenderness.  Lymphadenopathy:  He has no cervical adenopathy.  Neurological: He is alert and oriented to person, place, and time.  Skin: Skin is warm and dry. No rash noted. No erythema.  Psychiatric: He has a normal mood and affect. His behavior is normal.    Lab Results  Component Value  Date   CD4TCELL 21 (L) 09/12/2016   Lab Results  Component Value Date   CD4TABS 580 09/12/2016   CD4TABS 520 04/18/2016   CD4TABS 730 09/19/2015   Lab Results  Component Value Date   HIV1RNAQUANT <20 NOT DETECTED 09/12/2016   Lab Results  Component Value Date   HEPBSAB NEG 08/17/2013   Lab Results  Component Value Date   LABRPR NON REAC 04/18/2016    CBC Lab Results  Component  Value Date   WBC 8.1 12/25/2016   RBC 4.53 12/25/2016   HGB 13.7 12/25/2016   HCT 38.0 (L) 12/25/2016   PLT 236 12/25/2016   MCV 83.9 12/25/2016   MCH 30.2 12/25/2016   MCHC 36.1 (H) 12/25/2016   RDW 12.3 12/25/2016   LYMPHSABS 2,040 04/18/2016   MONOABS 408 04/18/2016   EOSABS 153 04/18/2016    BMET Lab Results  Component Value Date   NA 137 01/02/2017   K 4.3 01/02/2017   CL 102 01/02/2017   CO2 25 01/02/2017   GLUCOSE 71 01/02/2017   BUN 12 01/02/2017   CREATININE 0.94 01/02/2017   CALCIUM 9.6 01/02/2017   GFRNONAA >60 12/25/2016   GFRAA >60 12/25/2016      Assessment and Plan  hiv disease = will switch him to biktarvy to minimize drug side effect  Depression = continue on current regimen until he sees dr Katha Hamming  Health maintenance = will give flu and meningitis booster  Weight gain = reports increase calorie intake since living with his mother. Anticipate that once he starts work he will be more active,possible weight loss

## 2017-04-09 ENCOUNTER — Other Ambulatory Visit (HOSPITAL_COMMUNITY): Payer: Self-pay

## 2017-04-09 ENCOUNTER — Ambulatory Visit (INDEPENDENT_AMBULATORY_CARE_PROVIDER_SITE_OTHER): Payer: Self-pay | Admitting: Licensed Clinical Social Worker

## 2017-04-09 DIAGNOSIS — F411 Generalized anxiety disorder: Secondary | ICD-10-CM

## 2017-04-09 DIAGNOSIS — F331 Major depressive disorder, recurrent, moderate: Secondary | ICD-10-CM

## 2017-04-09 DIAGNOSIS — B20 Human immunodeficiency virus [HIV] disease: Secondary | ICD-10-CM

## 2017-04-09 DIAGNOSIS — F1520 Other stimulant dependence, uncomplicated: Secondary | ICD-10-CM

## 2017-04-09 LAB — COMPLETE METABOLIC PANEL WITH GFR
AG RATIO: 1.6 (calc) (ref 1.0–2.5)
ALT: 17 U/L (ref 9–46)
AST: 17 U/L (ref 10–40)
Albumin: 4.5 g/dL (ref 3.6–5.1)
Alkaline phosphatase (APISO): 93 U/L (ref 40–115)
BUN/Creatinine Ratio: 13 (calc) (ref 6–22)
BUN: 18 mg/dL (ref 7–25)
CALCIUM: 9.5 mg/dL (ref 8.6–10.3)
CHLORIDE: 102 mmol/L (ref 98–110)
CO2: 27 mmol/L (ref 20–32)
Creat: 1.42 mg/dL — ABNORMAL HIGH (ref 0.60–1.35)
GFR, EST NON AFRICAN AMERICAN: 63 mL/min/{1.73_m2} (ref >=60)
GFR, Est African American: 73 mL/min/{1.73_m2} (ref >=60)
GLUCOSE: 71 mg/dL (ref 65–99)
Globulin: 2.9 g/dL (calc) (ref 1.9–3.7)
POTASSIUM: 4.2 mmol/L (ref 3.5–5.3)
Sodium: 139 mmol/L (ref 135–146)
Total Bilirubin: 0.5 mg/dL (ref 0.2–1.2)
Total Protein: 7.4 g/dL (ref 6.1–8.1)

## 2017-04-09 LAB — CBC WITH DIFFERENTIAL/PLATELET
BASOS ABS: 39 {cells}/uL (ref 0–200)
BASOS PCT: 0.6 %
EOS PCT: 2.5 %
Eosinophils Absolute: 163 cells/uL (ref 15–500)
HEMATOCRIT: 43.1 % (ref 38.5–50.0)
HEMOGLOBIN: 14.8 g/dL (ref 13.2–17.1)
LYMPHS ABS: 2347 {cells}/uL (ref 850–3900)
MCH: 30.1 pg (ref 27.0–33.0)
MCHC: 34.3 g/dL (ref 32.0–36.0)
MCV: 87.8 fL (ref 80.0–100.0)
MPV: 9.3 fL (ref 7.5–12.5)
Monocytes Relative: 7.9 %
Neutro Abs: 3439 cells/uL (ref 1500–7800)
Neutrophils Relative %: 52.9 %
Platelets: 242 10*3/uL (ref 140–400)
RBC: 4.91 10*6/uL (ref 4.20–5.80)
RDW: 12.7 % (ref 11.0–15.0)
Total Lymphocyte: 36.1 %
WBC mixed population: 514 cells/uL (ref 200–950)
WBC: 6.5 10*3/uL (ref 3.8–10.8)

## 2017-04-09 LAB — T-HELPER CELL (CD4) - (RCID CLINIC ONLY)
CD4 T CELL HELPER: 27 % — AB (ref 33–55)
CD4 T Cell Abs: 660 /uL (ref 400–2700)

## 2017-04-09 LAB — RPR: RPR Ser Ql: NONREACTIVE

## 2017-04-10 ENCOUNTER — Other Ambulatory Visit (HOSPITAL_COMMUNITY): Payer: Self-pay | Admitting: Medical

## 2017-04-10 ENCOUNTER — Other Ambulatory Visit (HOSPITAL_COMMUNITY): Payer: Self-pay

## 2017-04-10 LAB — HIV-1 RNA QUANT-NO REFLEX-BLD
HIV 1 RNA QUANT: DETECTED {copies}/mL — AB
HIV-1 RNA QUANT, LOG: DETECTED {Log_copies}/mL — AB

## 2017-04-11 ENCOUNTER — Encounter (HOSPITAL_COMMUNITY): Payer: Self-pay | Admitting: Licensed Clinical Social Worker

## 2017-04-11 NOTE — Progress Notes (Signed)
  Weekly Group Progress Note  Program: OUTPATIENT SKILLS GROUP  Group Time: 5:30-6:30pm  Participation Level: Active  Behavioral Response: Appropriate and Sharing  Type of Therapy:  Psycho-education Group  Skills discussed: Self Care   Summary of Progress: Pt was engaged and moderately active in group. He presented as more upbeat and w/ better hygeine than previous sessions. 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 He reported he is also nervous. He stated he is a 7/10 in positive feelings tonight. He asked the group for feedback and support as he begins to manage anxiety and fear related to starting a new job.   Summary of Group: Pts were active and engaged in session and discussed challenges w/ anxiety, depression, mood disturbance, and addiction since last session. Pts shared openly w/ each other. Counselor led psychoeducation time dedicated to "the art of self care" and passed out a handout on 15 self care activities that pts were instructed to try before next session.    Margo Common, LPCA, LCASA

## 2017-04-14 ENCOUNTER — Other Ambulatory Visit (HOSPITAL_COMMUNITY): Payer: Self-pay

## 2017-04-16 ENCOUNTER — Other Ambulatory Visit (HOSPITAL_COMMUNITY): Payer: Self-pay

## 2017-04-16 ENCOUNTER — Ambulatory Visit (INDEPENDENT_AMBULATORY_CARE_PROVIDER_SITE_OTHER): Payer: Self-pay | Admitting: Licensed Clinical Social Worker

## 2017-04-16 DIAGNOSIS — F411 Generalized anxiety disorder: Secondary | ICD-10-CM

## 2017-04-16 DIAGNOSIS — F331 Major depressive disorder, recurrent, moderate: Secondary | ICD-10-CM

## 2017-04-16 DIAGNOSIS — F1520 Other stimulant dependence, uncomplicated: Secondary | ICD-10-CM

## 2017-04-16 DIAGNOSIS — B20 Human immunodeficiency virus [HIV] disease: Secondary | ICD-10-CM

## 2017-04-17 ENCOUNTER — Other Ambulatory Visit (HOSPITAL_COMMUNITY): Payer: Self-pay

## 2017-04-17 ENCOUNTER — Encounter (HOSPITAL_COMMUNITY): Payer: Self-pay | Admitting: Licensed Clinical Social Worker

## 2017-04-17 NOTE — Progress Notes (Signed)
  Weekly Group Progress Note  Program: OUTPATIENT SKILLS GROUP  Group Time: 5:30-6:30pm  Participation Level: Active  Behavioral Response: Appropriate and Sharing  Type of Therapy:  Psycho-education Group  Skills discussed: Mindfulness, Mandal[place], coloring   Summary of Progress: Pt continues to report positive progress and he shares openly in group. He denies any return to use. He discussed his feelings of dissatisfaction w/ his new job and asked for help on "learning how to be optimistic". Pt was receptive to feedback from counselor and group.   Summary of Group: Pts were active and engaged in psychoeducation and skill building group that focused on concept of relaxation and mindfulness through adult coloring. Pts were guided through a 15 min exercise to increase awareness and decrease stress.    Margo Common, LPCA, LCASA

## 2017-04-18 ENCOUNTER — Ambulatory Visit (HOSPITAL_COMMUNITY): Payer: Self-pay | Admitting: Licensed Clinical Social Worker

## 2017-04-23 ENCOUNTER — Ambulatory Visit (INDEPENDENT_AMBULATORY_CARE_PROVIDER_SITE_OTHER): Payer: Self-pay | Admitting: Licensed Clinical Social Worker

## 2017-04-23 DIAGNOSIS — F411 Generalized anxiety disorder: Secondary | ICD-10-CM

## 2017-04-23 DIAGNOSIS — F1520 Other stimulant dependence, uncomplicated: Secondary | ICD-10-CM

## 2017-04-23 DIAGNOSIS — B20 Human immunodeficiency virus [HIV] disease: Secondary | ICD-10-CM

## 2017-04-23 DIAGNOSIS — F331 Major depressive disorder, recurrent, moderate: Secondary | ICD-10-CM

## 2017-04-25 ENCOUNTER — Encounter (HOSPITAL_COMMUNITY): Payer: Self-pay | Admitting: Licensed Clinical Social Worker

## 2017-04-25 NOTE — Progress Notes (Signed)
  Weekly Group Progress Note  Program: OUTPATIENT SKILLS GROUP  Group Time: 5:30-6:30pm  Participation Level: Active  Behavioral Response: Appropriate and Sharing  Type of Therapy:  Psycho-education Group  Skills discussed: Mindfulness Meditation   Summary of Progress: Pt check in as a 6 on a scale of 1-10 w/ 10 being most positive feeling. Pt continues to make good progress in his post-CDIOP state of early recovery from Meth. He denies any cravings to use. He reports his new job is "going well but also is frustrating and unfamiliar since he has to work 5 days in a row". Pt reported that he currently does not use mindfulness but hopes to incorporate breathing into his day starting this week.  Summary of Group:  Counselor led processing and psychoeducation session aimed at increasing awareness, gaining insight into individual problems, and teaching mindfulness technique for stress reduction and to reduce cravings for substances.   Margo CommonWesley E Swan, LPCA, LCASA

## 2017-04-29 ENCOUNTER — Ambulatory Visit (INDEPENDENT_AMBULATORY_CARE_PROVIDER_SITE_OTHER): Payer: Self-pay | Admitting: Licensed Clinical Social Worker

## 2017-04-29 ENCOUNTER — Ambulatory Visit: Payer: Self-pay | Admitting: Dietician

## 2017-04-29 DIAGNOSIS — F411 Generalized anxiety disorder: Secondary | ICD-10-CM

## 2017-04-29 DIAGNOSIS — B20 Human immunodeficiency virus [HIV] disease: Secondary | ICD-10-CM

## 2017-04-29 DIAGNOSIS — F1520 Other stimulant dependence, uncomplicated: Secondary | ICD-10-CM

## 2017-04-29 DIAGNOSIS — F331 Major depressive disorder, recurrent, moderate: Secondary | ICD-10-CM

## 2017-04-30 ENCOUNTER — Ambulatory Visit (INDEPENDENT_AMBULATORY_CARE_PROVIDER_SITE_OTHER): Payer: Self-pay | Admitting: Licensed Clinical Social Worker

## 2017-04-30 DIAGNOSIS — F411 Generalized anxiety disorder: Secondary | ICD-10-CM

## 2017-04-30 DIAGNOSIS — F1520 Other stimulant dependence, uncomplicated: Secondary | ICD-10-CM

## 2017-04-30 DIAGNOSIS — F331 Major depressive disorder, recurrent, moderate: Secondary | ICD-10-CM

## 2017-04-30 DIAGNOSIS — B20 Human immunodeficiency virus [HIV] disease: Secondary | ICD-10-CM

## 2017-05-01 ENCOUNTER — Encounter (HOSPITAL_COMMUNITY): Payer: Self-pay | Admitting: Licensed Clinical Social Worker
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
## 2017-05-02 ENCOUNTER — Encounter (HOSPITAL_COMMUNITY): Payer: Self-pay | Admitting: Licensed Clinical Social Worker

## 2017-05-02 NOTE — Progress Notes (Signed)
  Weekly Group Progress Note  Program: OUTPATIENT SKILLS GROUP  Group Time: 5:30-6:30pm  Participation Level: Active  Behavioral Response: Appropriate and Sharing  Type of Therapy:  Psycho-education Group  Skills discussed: Mindfulness, Assertiveness, Communication   Summary of Progress: PT continues to make good progress on his sobriety, is active in his attendance of AA/NA, and works w/ his sponsor. He reports feeling less bothered by his coworkers at work. He reports he is a 6 on a scale of 1-10 w/ 10 being great.  Summary of Group: Counselor led 1 hour psychoeducation group based around a brief check in, discussion of individual goals, and increasing assertive communication". Counselor began group w/ 5 min mindfulness meditation to build focus and increase awareness.   Margo CommonWesley E Swan, LPCA, LCASA

## 2017-05-06 ENCOUNTER — Ambulatory Visit (INDEPENDENT_AMBULATORY_CARE_PROVIDER_SITE_OTHER): Payer: Self-pay | Admitting: Licensed Clinical Social Worker

## 2017-05-06 DIAGNOSIS — B20 Human immunodeficiency virus [HIV] disease: Secondary | ICD-10-CM

## 2017-05-06 DIAGNOSIS — F411 Generalized anxiety disorder: Secondary | ICD-10-CM

## 2017-05-06 DIAGNOSIS — F331 Major depressive disorder, recurrent, moderate: Secondary | ICD-10-CM

## 2017-05-06 DIAGNOSIS — F1520 Other stimulant dependence, uncomplicated: Secondary | ICD-10-CM

## 2017-05-07 ENCOUNTER — Ambulatory Visit (HOSPITAL_COMMUNITY): Payer: Self-pay | Admitting: Licensed Clinical Social Worker

## 2017-05-07 ENCOUNTER — Telehealth (HOSPITAL_COMMUNITY): Payer: Self-pay | Admitting: Licensed Clinical Social Worker

## 2017-05-08 ENCOUNTER — Encounter (HOSPITAL_COMMUNITY): Payer: Self-pay | Admitting: Licensed Clinical Social Worker

## 2017-05-08 NOTE — Progress Notes (Signed)
  Weekly Group Progress Note  Program: OUTPATIENT SKILLS GROUP  Group Time: 5:30-6:30pm  Participation Level: Active  Behavioral Response: Appropriate and Sharing  Type of Therapy:  Psycho-education Group  Skills discussed: Relapse Prevention   Summary of Progress: Pt continues to make strong progress in aftercare group. He reports continued sobriety. He admits he "needs to go to more meetings regularly". Pt discussed his upcoming move into his own appt and the possible triggers he will face. Pt was provided helpful feedback and support from group members and counselor.  Summary of Group: Pts were active and engage in group skill-building session. Counselor led brief check-in and encouraged pts to identify their "needs for tonight" and how the group could help them gain insight or relieve symptoms. Counselor led brief psychoeducational session on relapse prevention and sticking to routine.    Margo CommonWesley E Swan, LPCA, LCASA

## 2017-05-09 ENCOUNTER — Other Ambulatory Visit (HOSPITAL_COMMUNITY): Payer: Self-pay | Admitting: Medical

## 2017-05-13 ENCOUNTER — Ambulatory Visit (INDEPENDENT_AMBULATORY_CARE_PROVIDER_SITE_OTHER): Payer: Self-pay | Admitting: Licensed Clinical Social Worker

## 2017-05-13 ENCOUNTER — Encounter (HOSPITAL_COMMUNITY): Payer: Self-pay | Admitting: Licensed Clinical Social Worker

## 2017-05-13 ENCOUNTER — Ambulatory Visit (HOSPITAL_COMMUNITY): Payer: Self-pay | Admitting: Licensed Clinical Social Worker

## 2017-05-13 ENCOUNTER — Encounter: Payer: Medicaid Other | Attending: Medical | Admitting: Dietician

## 2017-05-13 DIAGNOSIS — F411 Generalized anxiety disorder: Secondary | ICD-10-CM

## 2017-05-13 DIAGNOSIS — F1520 Other stimulant dependence, uncomplicated: Secondary | ICD-10-CM

## 2017-05-13 DIAGNOSIS — Z713 Dietary counseling and surveillance: Secondary | ICD-10-CM | POA: Insufficient documentation

## 2017-05-13 DIAGNOSIS — E7800 Pure hypercholesterolemia, unspecified: Secondary | ICD-10-CM | POA: Insufficient documentation

## 2017-05-13 DIAGNOSIS — B20 Human immunodeficiency virus [HIV] disease: Secondary | ICD-10-CM

## 2017-05-13 DIAGNOSIS — Z6832 Body mass index (BMI) 32.0-32.9, adult: Secondary | ICD-10-CM | POA: Insufficient documentation

## 2017-05-13 DIAGNOSIS — F331 Major depressive disorder, recurrent, moderate: Secondary | ICD-10-CM

## 2017-05-13 DIAGNOSIS — E669 Obesity, unspecified: Secondary | ICD-10-CM | POA: Insufficient documentation

## 2017-05-13 DIAGNOSIS — E639 Nutritional deficiency, unspecified: Secondary | ICD-10-CM | POA: Insufficient documentation

## 2017-05-13 NOTE — Progress Notes (Signed)
  Weekly Group Progress Note  Program: OUTPATIENT SKILLS GROUP  Group Time: 5:30-6:30pm  Participation Level: Active  Behavioral Response: Appropriate and Sharing  Type of Therapy:  Psycho-education Group  Skills discussed: Challenging Myths and dangerous self talk   Summary of Progress: Pt continues to make and sustain good progress towards his sobriety. He plans to move into his own apartment in one week. Pt admits he is still having triggers to use but feels confident in his ability to manage them through 12 step support and distraction techniques.     Margo CommonWesley E Loxley Schmale, LPCA, LCASA

## 2017-05-13 NOTE — Progress Notes (Signed)
   THERAPIST PROGRESS NOTE  Session Time: 2-3   Participation Level: Active  Behavioral Response: Neat and Well GroomedAlertEuthymic  Type of Therapy: Individual Therapy  Treatment Goals addressed: Anxiety and Diagnosis: SUD  Interventions: CBT, Motivational Interviewing, Strength-based and Supportive  Summary: [name] is a 36 y.o. male who presents with Methamphetamine Use Disorder in sobriety since 6/18. He denies using any mind-altering drugs or alcohol and reports his life as been "mostly the same since last week". Pt states he still intends to move into his new apartment in 1 week and wants to continue to have his mom view his finances until Feb 2019 to "continue the accountability".   Pt admits he is "still triggered by getting paid" and did not expect it to be as bad as it is. Counselor spends time getting to know "the voice in the pt that tells him using would be positive". Pt states it would "give him unlimited energy" and "feels superhuman". Pt then states he often feels "too composed" in his everyday life and likes that drug use breaks him out of his routine.  Pt and counselor explore ways that pt "conforms himself" to certain stereotypes and expectations of society. Counselor challenges pt to explore and enact options for behaviors that "challenge social norms/expectations w/o using drugs, video games, and/or sex." Pt and counselor explore ideas for pt to engage more of his creativity in acting in local theater.  Suicidal/Homicidal: Nowithout intent/plan  Therapist Response: Counselor used open questions and reflection to help pt gain insight into his "all or nothing" thinking around satisfying himself or denying himself. Counselor used CBT to help challenge pt's irrational beliefs around "needing to be a certain type of man".   Plan: Return again in 1 weeks.  Diagnosis:    ICD-10-CM   1. 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 Major  depressive disorder, recurrent episode, moderate (HCC) F33.1   3. Generalized anxiety disorder F41.1   4. Human immunodeficiency virus (HIV) disease (HCC) B20       Margo CommonWesley E Swan, LCAS-A 05/13/2017

## 2017-05-13 NOTE — Patient Instructions (Signed)
Great job on the changes that you have made!  Mindful eating  Avoiding dessert and sugar  Continue to journal if you find this helpful. Continue to stay active. Consider drinking more water or seltzer water and less diet soda.  Progress not perfection.

## 2017-05-13 NOTE — Progress Notes (Signed)
Medical Nutrition Therapy:  Appt start time: 1535 end time:  1550.   Assessment:  Initial Visit 03/31/17: Primary concerns today: Patient is here today alone.  He would like to learn tips on how to lose weight.  He has counted calories before as well as high protein diet as well.  He lost 50-60 lbs the last time and has since regained the weight back.  He was referred for pure hypercholesterolemia, obesity, and nutrition deficiency.  Other hx includes depression and anxiety, 042, history of ETOH abuse and currently under treatment for methamphetamine addiction.  He reports that his body image is poor.  Labs:  Cholesterol 214, triglycerides 139, HDL 48, LDL 138.    Weight hx: Lowest adult weight 150 lbs Highest adult weight 260 lbs in high school (gave up sweet tea and soda) Today's weight 226  Patient is currently living with his mother.  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  He is going to move into his own apartment in November.  He does not know how to cook.  Currently he is eating out most often and when living alone was skipping meals due to poor appetite due to drug addiction and would eat a lot of sandwiches.  He states that he eats very quickly and is often excessively hungry when he starts to eat.  He is waking up currently and eating.  TANITA  BODY COMP RESULTS 03/31/17 236 lbs   BMI (kg/m^2) 33.9   Fat Mass (lbs) 90.6   Fat Free Mass (lbs) 145.4   Total Body Water (lbs) 110   05/13/17: Patient is here today alone.  Since his last visit he has gotten a job in the [employer] department at [employer] 5 days per week.  He gets 10,000-15,000 steps per day when he is working.  He has been tracking his intake in the Lose It app and keeping to about 1800-1900 calories.  He has been working on mindful eating, has not been eating desserts or sugar.  He struggled with decreasing the  diet soda and drinking more water and will eat in front of the TV when he is with his mom or alone.  He has gained water weight and gained muscle mass based on the Tanita Scale results with overall weight loss of almost 9 lbs in the past 6 weeks. He has not questions at this time.  TANITA  BODY COMP RESULTS 05/13/17 227.6 lbs   BMI (kg/m^2) 32.7   Fat Mass (lbs) 62   Fat Free Mass (lbs) 165.6   Total Body Water (lbs) 116     Preferred Learning Style:   No preference indicated   Learning Readiness:   Ready  Change in progress   MEDICATIONS: see list   DIETARY INTAKE: Eating out most of the time.  He does not like vegetables.  24-hr recall: 03/31/17 B ( AM): cereal (cheerios sweetened), 2% milk Snk ( AM): none  L ( PM): fast food or sandwich Snk ( PM): none D ( PM): fast food (pizza or fried foods), ice cream or smoothie Snk ( PM): wakes up and eats (leftover pizza) Beverages: diet soda (2 L daily) and occasional water, or unsweetened tea with artificial sweetener, occasional coffee cream and artificial sweetener, No etoh since 2015.  Usual physical activity: occasional walking.  Gym membership comes with his rent when he  moves into his new apartment.  Finds walking soothing, yoga.  Prefers indoors. (Cardio and weight lifting last time he lost weight.)  Estimated energy needs: 1800 calories 120 g protein  Progress Towards Goal(s):  In progress.   Nutritional Diagnosis:  NB-1.1 Food and nutrition-related knowledge deficit As related to balanced eating.  As evidenced by diet hx and patient report.    Intervention:  Nutrition counseling/education related to healthy eating.  Discussed nutrition low in saturated fat and decreased added sugar and challenged him to continue to try vegetables prepared in different ways.  Discussed transition from diet soda to water.  Discussed healthy snacks to avoid cravings and importance of nourishing his body earlier in the day to avoid night  snacking.  Discussed mindful eating.  Discussed the benefits of increased physical activity. Continued today.  Rethink what you drink.  Consider more water and fewer diet soda. (Bone health, less sweet craving?, better hydration, less caffeine, better for your kidneys)  Great job on the changes that you have made!  Mindful eating  Avoiding dessert and sugar  Continue to journal if you find this helpful. Continue to stay active. Consider drinking more water or seltzer water and less diet soda.  Progress not perfection.  Teaching Method Utilized:  Visual Auditory Hands on  Handouts given during initial visit include:  Meal plan card  My plate  Snack list  Barriers to learning/adherence to lifestyle change: lack of cooking skills, food preferences  Demonstrated degree of understanding via:  Teach Back   Monitoring/Evaluation:  Dietary intake, exercise, and body weight in 2 week(s).

## 2017-05-15 ENCOUNTER — Other Ambulatory Visit: Payer: Self-pay

## 2017-05-15 ENCOUNTER — Emergency Department
Admission: EM | Admit: 2017-05-15 | Discharge: 2017-05-15 | Disposition: A | Discharge status: Home / Self Care | Payer: Self-pay | Source: Home / Self Care | Attending: Family Medicine | Admitting: Family Medicine

## 2017-05-15 DIAGNOSIS — R002 Palpitations: Secondary | ICD-10-CM

## 2017-05-15 NOTE — ED Provider Notes (Signed)
[facility]    CSN: [id number] Arrival date & time: 05/15/17  1548     History   Chief Complaint Chief Complaint  Patient presents with  . Irregular Heart Beat    HPI Jimmy Davis is a 36 y.o. male.   HPI  Jimmy Davis is a 36 y.o. male presenting to UC with c/o intermittent palpitations described as "skipped beats" that started about 3 days ago, gradually improving, less frequent today but still present.  Pt denies having symptoms at this time but was concerned the skipped beats have been going on for a few days.  Pt does have a hx of stimulant use disorder and anxiety but denies taking stimulants recently besides a cup of coffee in the morning, which is new for him.  Denies chest pain or SOB when the palpitations happen. Nothing seems to make them better or worse.  It only lasts for a second at a time.  No prior hx of CAD but has had multiple EKGs in the past.  He has never seen a PCP.  Family hx of CAD with his dad having an MI in his 2140s.    Past Medical History:  Diagnosis Date  . Anxiety   . Depression   . Human immunodeficiency virus (HIV) disease (HCC)   . 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 Substance abuse [facility](HCC)     Patient Active Problem List   Diagnosis Date Noted  . Obesity (BMI 30.0-34.9) 03/26/2017  . Obsessive-compulsive behavior 12/13/2016  . Anxiety 12/11/2016  . Methamphetamine use disorder, severe, dependence (HCC) 12/09/2016  . Major depressive disorder, recurrent episode, moderate (HCC) 11/17/2015  . 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 Depression 10/03/2014  . Human immunodeficiency virus (HIV) disease (HCC) 06/29/2013    Past Surgical History:  Procedure Laterality Date  . WISDOM TOOTH EXTRACTION         Home Medications    Prior to Admission medications   Medication Sig Start Date End Date Taking? Authorizing Provider  acetaminophen (TYLENOL) 500 MG tablet Take 1 tablet (500 mg total) by mouth every 6 (six) hours as  needed for mild pain or moderate pain. 10/23/16   Fayrene Helperran, Bowie, PA-C  baclofen (LIORESAL) 20 MG tablet Take 1 tablet (20 mg total) by mouth 2 (two) times daily. 03/26/17 03/26/18  Court JoyKober, Charles E, PA-C  cetirizine (ZYRTEC) 10 MG tablet Take 1 tablet (10 mg total) by mouth daily. Patient not taking: Reported on 04/08/2017 06/28/15   Judyann MunsonSnider, Cynthia, MD  FLUoxetine (PROZAC) 40 MG capsule Take 1 capsule (40 mg total) by mouth daily. 03/26/17   Court JoyKober, Charles E, PA-C  FLUoxetine (PROZAC) 40 MG capsule TAKE ONE CAPSULE BY MOUTH DAILY 05/12/17   Maryjean MornKober, Charles E, PA-C  GENVOYA 150-150-200-10 MG TABS tablet TK 1 T PO D WITH BRE 01/06/17   [provider]  lamoTRIgine (LAMICTAL) 100 MG tablet Take 1 tablet (100 mg total) by mouth daily. 03/26/17 04/25/18  Court JoyKober, Charles E, PA-C  Multiple Vitamin (MULTIVITAMIN) capsule Take 1 capsule by mouth daily. Patient not taking: Reported on 09/19/2016 11/17/15   Thermon Leylandavis, Laura A, NP    Family History Family History  Problem Relation Age of Onset  . Heart disease Father   . Mental illness Maternal Grandmother     Social History Social History   Tobacco Use  . Smoking status: Never Smoker  . Smokeless tobacco: Never Used  Substance Use Topics  . Alcohol use: No    Alcohol/week: 0.0 oz  .  Drug use: No    Comment: Pt has hx of meth dependence. Sober and in tx since 12/26/16     Allergies   Wellbutrin [bupropion]   Review of Systems Review of Systems  Constitutional: Negative for chills, diaphoresis, fatigue and fever.  Respiratory: Negative for cough, chest tightness, shortness of breath and wheezing.   Cardiovascular: Positive for palpitations. Negative for chest pain and leg swelling.  Gastrointestinal: Negative for abdominal pain, diarrhea, nausea and vomiting.  Neurological: Negative for dizziness and light-headedness.     Physical Exam Triage Vital Signs ED Triage Vitals  Enc Vitals Group     BP 05/15/17 1619 133/80     Pulse Rate  05/15/17 1619 70     Resp --      Temp 05/15/17 1619 98 F (36.7 C)     Temp Source 05/15/17 1619 Oral     SpO2 05/15/17 1619 98 %     Weight 05/15/17 1620 228 lb (103.4 kg)     Height 05/15/17 1620 5\' 10"  (1.778 m)     Head Circumference --      Peak Flow --      Pain Score 05/15/17 1620 0     Pain Loc --      Pain Edu? --      Excl. in GC? --    No data found.  Updated Vital Signs BP 133/80 (BP Location: Right Arm)   Pulse 70   Temp 98 F (36.7 C) (Oral)   Ht 5\' 10"  (1.778 m)   Wt 228 lb (103.4 kg)   SpO2 98%   BMI 32.71 kg/m   Visual Acuity Right Eye Distance:   Left Eye Distance:   Bilateral Distance:    Right Eye Near:   Left Eye Near:    Bilateral Near:     Physical Exam  Constitutional: He is oriented to person, place, and time. He appears well-developed and well-nourished. No distress.  Obese male sitting in exam chair. NAD. Appears well, non-toxic.   HENT:  Head: Normocephalic and atraumatic.  Mouth/Throat: Oropharynx is clear and moist.  Eyes: EOM are normal.  Neck: Normal range of motion. Neck supple.  Cardiovascular: Normal rate and regular rhythm.  Pulmonary/Chest: Effort normal and breath sounds normal. No stridor. No respiratory distress. He has no wheezes. He has no rales.  Musculoskeletal: Normal range of motion.  Neurological: He is alert and oriented to person, place, and time.  Skin: Skin is warm and dry. He is not diaphoretic.  Psychiatric: He has a normal mood and affect. His behavior is normal.  Nursing note and vitals reviewed.    UC Treatments / Results  Labs (all labs ordered are listed, but only abnormal results are displayed) Labs Reviewed - No data to display  EKG  EKG Interpretation Normal sinus rhythm, septal infarct, age undetermined. Abnormal ECG See scanned EKG      Radiology No results found.  Procedures Procedures (including critical care time)  Medications Ordered in UC Medications - No data to  display   Initial Impression / Assessment and Plan / UC Course  I have reviewed the triage vital signs and the nursing notes.  Pertinent labs & imaging results that were available during my care of the patient were reviewed by me and considered in my medical decision making (see chart for details).     Pt c/o intermittent palpitations for the last 3 days. Improved today.   Pt appears well. NAD. No PVCs noted on EKG  Reassured pt, symptoms likely from infrequent PVCs which can be provoked by stimulant use, including caffeine as well as stress or lack of sleep. Encouraged to limit caffeine use and decrease stress when possible Due to PMH and family hx, encouraged establishing care with a PCP- pt states he does plan on doing this. Also encouraged f/u with cardiology, he may benefit from a holter monitor if symptoms persist.   Discussed symptoms that warrant emergent care in the ED.   Final Clinical Impressions(s) / UC Diagnoses   Final diagnoses:  Palpitations    ED Discharge Orders    None       Controlled Substance Prescriptions [place] Controlled Substance Registry consulted? Not Applicable   Rolla Platehelps, Merida Alcantar O, PA-C 05/15/17 1829

## 2017-05-15 NOTE — ED Triage Notes (Signed)
Pt stated that heart has been feeling like it was skipping beats Tuesday, some yesterday, and not as bad today.

## 2017-05-17 ENCOUNTER — Telehealth: Payer: Self-pay | Admitting: Emergency Medicine

## 2017-05-20 ENCOUNTER — Emergency Department
Admission: EM | Admit: 2017-05-20 | Discharge: 2017-05-20 | Disposition: A | Discharge status: Home / Self Care | Payer: Self-pay | Source: Home / Self Care | Attending: Family Medicine | Admitting: Family Medicine

## 2017-05-20 ENCOUNTER — Other Ambulatory Visit: Payer: Self-pay

## 2017-05-20 ENCOUNTER — Encounter: Payer: Self-pay | Admitting: *Deleted

## 2017-05-20 DIAGNOSIS — R21 Rash and other nonspecific skin eruption: Secondary | ICD-10-CM

## 2017-05-20 DIAGNOSIS — B029 Zoster without complications: Secondary | ICD-10-CM

## 2017-05-20 MED ORDER — TRIAMCINOLONE ACETONIDE 0.1 % EX CREA: 1.0000 "application " | TOPICAL_CREAM | Freq: Two times a day (BID) | CUTANEOUS | 0 refills | Status: DC

## 2017-05-20 MED ORDER — PREDNISONE 20 MG PO TABS: ORAL_TABLET | ORAL | 0 refills | Status: DC

## 2017-05-20 MED ORDER — LIDOCAINE 5 % EX OINT: 1.0000 "application " | TOPICAL_OINTMENT | Freq: Three times a day (TID) | CUTANEOUS | 0 refills | Status: DC | PRN

## 2017-05-20 MED ORDER — VALACYCLOVIR HCL 1 G PO TABS: 1000.0000 mg | ORAL_TABLET | Freq: Three times a day (TID) | ORAL | 0 refills | Status: DC

## 2017-05-20 NOTE — ED Provider Notes (Signed)
[name]    CSN: [id number] Arrival date & time: 05/20/17  1535     History   Chief Complaint Chief Complaint  Patient presents with  . Rash    HPI Jimmy Davis is a 36 y.o. male.   HPI Jimmy Davis is a 36 y.o. male presenting to UC with c/o red itchy rash to Right medial thigh for about 2 weeks and a new itching painful red rash on back and under Right arm since yesterday.  He has not tried anything for his symptoms as the rash on his thigh was not bothering him too much but the rash under his arm and back are more bothersome. Denies fever, chills, n/v/d.  No prior hx of shingles but his mother advised him that's what she believed the rash was. Hx of HIV. He does not have a PCP but he does take his HIV medication as prescribed.    Past Medical History:  Diagnosis Date  . Anxiety   . Depression   . Human immunodeficiency virus (HIV) disease (HCC)   . 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 Substance abuse [facility])     Patient Active Problem List   Diagnosis Date Noted  . Obesity (BMI 30.0-34.9) 03/26/2017  . Obsessive-compulsive behavior 12/13/2016  . Anxiety 12/11/2016  . Methamphetamine use disorder, severe, dependence (HCC) 12/09/2016  . Major depressive disorder, recurrent episode, moderate (HCC) 11/17/2015  . 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 Depression 10/03/2014  . Human immunodeficiency virus (HIV) disease (HCC) 06/29/2013    Past Surgical History:  Procedure Laterality Date  . WISDOM TOOTH EXTRACTION         Home Medications    Prior to Admission medications   Medication Sig Start Date End Date Taking? Authorizing Provider  baclofen (LIORESAL) 20 MG tablet Take 1 tablet (20 mg total) by mouth 2 (two) times daily. 03/26/17 03/26/18 Yes Court Joy, PA-C  Bictegravir-Emtricitab-Tenofov (BIKTARVY PO) Take by mouth.   Yes [provider]  FLUoxetine (PROZAC) 40 MG capsule Take 1 capsule (40 mg total) by mouth daily.  03/26/17  Yes Court Joy, PA-C  lamoTRIgine (LAMICTAL) 100 MG tablet Take 1 tablet (100 mg total) by mouth daily. 03/26/17 04/25/18 Yes Court Joy, PA-C  acetaminophen (TYLENOL) 500 MG tablet Take 1 tablet (500 mg total) by mouth every 6 (six) hours as needed for mild pain or moderate pain. 10/23/16   Fayrene Helper, PA-C  cetirizine (ZYRTEC) 10 MG tablet Take 1 tablet (10 mg total) by mouth daily. Patient not taking: Reported on 04/08/2017 06/28/15   Judyann Munson, MD  FLUoxetine (PROZAC) 40 MG capsule TAKE ONE CAPSULE BY MOUTH DAILY 05/12/17   Maryjean Morn E, PA-C  GENVOYA 150-150-200-10 MG TABS tablet TK 1 T PO D WITH BRE 01/06/17   [provider]  lidocaine (XYLOCAINE) 5 % ointment Apply 1 application 3 (three) times daily as needed topically. To back and underarm for pain and itching 05/20/17   Lurene Shadow, PA-C  Multiple Vitamin (MULTIVITAMIN) capsule Take 1 capsule by mouth daily. Patient not taking: Reported on 09/19/2016 11/17/15   Thermon Leyland, NP  predniSONE (DELTASONE) 20 MG tablet 3 tabs po day one, then 2 po daily x 4 days 05/20/17   Lurene Shadow, PA-C  triamcinolone cream (KENALOG) 0.1 % Apply 1 application 2 (two) times daily topically. To Right thigh for itching 05/20/17   Lurene Shadow, PA-C  valACYclovir (VALTREX) 1000 MG tablet Take 1 tablet (1,000 mg  total) 3 (three) times daily by mouth. 05/20/17   Lurene ShadowPhelps, Joby Hershkowitz O, PA-C    Family History Family History  Problem Relation Age of Onset  . Heart disease Father   . Mental illness Maternal Grandmother     Social History Social History   Tobacco Use  . Smoking status: Never Smoker  . Smokeless tobacco: Never Used  Substance Use Topics  . Alcohol use: No    Alcohol/week: 0.0 oz  . Drug use: No    Comment: Pt has hx of meth dependence. Sober and in tx since 12/26/16     Allergies   Wellbutrin [bupropion]   Review of Systems Review of Systems  Constitutional: Negative for chills and fever.    Gastrointestinal: Negative for diarrhea, nausea and vomiting.  Skin: Positive for color change and rash. Negative for wound.     Physical Exam Triage Vital Signs ED Triage Vitals  Enc Vitals Group     BP 05/20/17 1556 118/70     Pulse Rate 05/20/17 1556 76     Resp 05/20/17 1556 16     Temp 05/20/17 1556 98 F (36.7 C)     Temp Source 05/20/17 1556 Oral     SpO2 05/20/17 1556 96 %     Weight 05/20/17 1557 225 lb (102.1 kg)     Height 05/20/17 1557 5\' 10"  (1.778 m)     Head Circumference --      Peak Flow --      Pain Score 05/20/17 1557 2     Pain Loc --      Pain Edu? --      Excl. in GC? --    No data found.  Updated Vital Signs BP 118/70 (BP Location: Right Arm)   Pulse 76   Temp 98 F (36.7 C) (Oral)   Resp 16   Ht 5\' 10"  (1.778 m)   Wt 225 lb (102.1 kg)   SpO2 96%   BMI 32.28 kg/m   Visual Acuity Right Eye Distance:   Left Eye Distance:   Bilateral Distance:    Right Eye Near:   Left Eye Near:    Bilateral Near:     Physical Exam  Constitutional: He is oriented to person, place, and time. He appears well-developed and well-nourished. No distress.  HENT:  Head: Normocephalic and atraumatic.  Eyes: EOM are normal.  Neck: Normal range of motion.  Cardiovascular: Normal rate.  Pulmonary/Chest: Effort normal. No respiratory distress.  Musculoskeletal: Normal range of motion.  Neurological: He is alert and oriented to person, place, and time.  Skin: Skin is warm and dry. Rash noted. He is not diaphoretic. There is erythema.     Right medial thigh: faint erythematous ecchymotic type rash. No induration or fluctuance. Non-tender. Rash does blanch.   Right axilla and back: diffuse erythematous vesicular lesions in clusters along dermatome, expanding to Right mid to upper back. Mildly tender. No active bleeding or discharge.   Psychiatric: He has a normal mood and affect. His behavior is normal.  Nursing note and vitals reviewed.    UC Treatments /  Results  Labs (all labs ordered are listed, but only abnormal results are displayed) Labs Reviewed - No data to display  EKG  EKG Interpretation None       Radiology No results found.  Procedures Procedures (including critical care time)  Medications Ordered in UC Medications - No data to display   Initial Impression / Assessment and Plan / UC Course  I have reviewed the triage vital signs and the nursing notes.  Pertinent labs & imaging results that were available during my care of the patient were reviewed by me and considered in my medical decision making (see chart for details).     Rash on Right thigh: non-specific Rash in Right axilla and Right upper/mid back c/w herpes zoster w/o complication Home care instructions provided Encouraged f/u with a PCP next week for recheck of symptoms and for ongoing healthcare needs.   Final Clinical Impressions(s) / UC Diagnoses   Final diagnoses:  Herpes zoster without complication  Rash and nonspecific skin eruption    ED Discharge Orders        Ordered    valACYclovir (VALTREX) 1000 MG tablet  3 times daily     05/20/17 1606    predniSONE (DELTASONE) 20 MG tablet     05/20/17 1606    lidocaine (XYLOCAINE) 5 % ointment  3 times daily PRN     05/20/17 1606    triamcinolone cream (KENALOG) 0.1 %  2 times daily     05/20/17 1606       Controlled Substance Prescriptions [place] Controlled Substance Registry consulted? Not Applicable   Rolla Platehelps, Noretta Frier O, PA-C 05/20/17 [id number]

## 2017-05-20 NOTE — ED Triage Notes (Signed)
Pt c/o painful rash on his RT groin area x 2 wks and rash on his RT underarm and upper back x 1 day. Denies itching.

## 2017-05-20 NOTE — Discharge Instructions (Signed)
°  You may take 500mg acetaminophen every 4-6 hours or in combination with ibuprofen 400-600mg every 6-8 hours as needed for pain, inflammation, and fever. ° °

## 2017-05-27 ENCOUNTER — Other Ambulatory Visit: Payer: Self-pay

## 2017-05-27 ENCOUNTER — Encounter: Payer: Self-pay | Admitting: *Deleted

## 2017-05-27 ENCOUNTER — Ambulatory Visit (HOSPITAL_COMMUNITY): Payer: Self-pay | Admitting: Licensed Clinical Social Worker

## 2017-05-27 ENCOUNTER — Emergency Department (INDEPENDENT_AMBULATORY_CARE_PROVIDER_SITE_OTHER)
Admission: EM | Admit: 2017-05-27 | Discharge: 2017-05-27 | Disposition: A | Discharge status: Home / Self Care | Payer: Medicaid Other | Source: Home / Self Care | Attending: Family Medicine | Admitting: Family Medicine

## 2017-05-27 DIAGNOSIS — J111 Influenza due to unidentified influenza virus with other respiratory manifestations: Secondary | ICD-10-CM

## 2017-05-27 DIAGNOSIS — R69 Illness, unspecified: Secondary | ICD-10-CM | POA: Diagnosis not present

## 2017-05-27 MED ORDER — OSELTAMIVIR PHOSPHATE 75 MG PO CAPS: 75.0000 mg | ORAL_CAPSULE | Freq: Two times a day (BID) | ORAL | 0 refills | Status: DC

## 2017-05-27 NOTE — Discharge Instructions (Signed)
Take plain guaifenesin (1200mg extended release tabs such as Mucinex) twice daily, with plenty of water, for cough and congestion.  May add Pseudoephedrine (30mg, one or two every 4 to 6 hours) for sinus congestion.  Get adequate rest.   °May use Afrin nasal spray (or generic oxymetazoline) each morning for about 5 days and then discontinue.  Also recommend using saline nasal spray several times daily and saline nasal irrigation (AYR is a common brand).  Use Flonase nasal spray each morning after using Afrin nasal spray and saline nasal irrigation. °Try warm salt water gargles for sore throat.  °Stop all antihistamines for now, and other non-prescription cough/cold preparations. °May take Ibuprofen 200mg, 4 tabs every 8 hours with food for body aches, fever, etc. °May take Delsym Cough Suppressant at bedtime for nighttime cough.  °

## 2017-05-27 NOTE — ED Triage Notes (Signed)
Pt c/o excessive sweating and fatigue today.

## 2017-05-27 NOTE — ED Provider Notes (Signed)
[facility]    CSN: [id number] Arrival date & time: 05/27/17  1525     History   Chief Complaint Chief Complaint  Patient presents with  . Excessive Sweating    HPI Jimmy Davis is a 36 y.o. male.   About 6 hours ago patient suddenly developed chills/sweats, followed by fatigue, myalgias, light-headedness, mild cough, and nasal congestion.  No GI or GU symptoms.   The history is provided by the patient.    Past Medical History:  Diagnosis Date  . Anxiety   . Depression   . Human immunodeficiency virus (HIV) disease (HCC)   . 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 Substance abuse [facility])     Patient Active Problem List   Diagnosis Date Noted  . Obesity (BMI 30.0-34.9) 03/26/2017  . Obsessive-compulsive behavior 12/13/2016  . Anxiety 12/11/2016  . Methamphetamine use disorder, severe, dependence (HCC) 12/09/2016  . Major depressive disorder, recurrent episode, moderate (HCC) 11/17/2015  . 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 Depression 10/03/2014  . Human immunodeficiency virus (HIV) disease (HCC) 06/29/2013    Past Surgical History:  Procedure Laterality Date  . WISDOM TOOTH EXTRACTION         Home Medications    Prior to Admission medications   Medication Sig Start Date End Date Taking? Authorizing Provider  acetaminophen (TYLENOL) 500 MG tablet Take 1 tablet (500 mg total) by mouth every 6 (six) hours as needed for mild pain or moderate pain. 10/23/16   Fayrene Helper, PA-C  baclofen (LIORESAL) 20 MG tablet Take 1 tablet (20 mg total) by mouth 2 (two) times daily. 03/26/17 03/26/18  Court Joy, PA-C  Bictegravir-Emtricitab-Tenofov (BIKTARVY PO) Take by mouth.    [provider]  cetirizine (ZYRTEC) 10 MG tablet Take 1 tablet (10 mg total) by mouth daily. Patient not taking: Reported on 04/08/2017 06/28/15   Judyann Munson, MD  FLUoxetine (PROZAC) 40 MG capsule Take 1 capsule (40 mg total) by mouth daily. 03/26/17   Court Joy,  PA-C  FLUoxetine (PROZAC) 40 MG capsule TAKE ONE CAPSULE BY MOUTH DAILY 05/12/17   Maryjean Morn E, PA-C  GENVOYA 150-150-200-10 MG TABS tablet TK 1 T PO D WITH BRE 01/06/17   [provider]  lamoTRIgine (LAMICTAL) 100 MG tablet Take 1 tablet (100 mg total) by mouth daily. 03/26/17 04/25/18  Court Joy, PA-C  lidocaine (XYLOCAINE) 5 % ointment Apply 1 application 3 (three) times daily as needed topically. To back and underarm for pain and itching 05/20/17   Lurene Shadow, PA-C  Multiple Vitamin (MULTIVITAMIN) capsule Take 1 capsule by mouth daily. Patient not taking: Reported on 09/19/2016 11/17/15   Thermon Leyland, NP  oseltamivir (TAMIFLU) 75 MG capsule Take 1 capsule (75 mg total) by mouth every 12 (twelve) hours. 05/27/17   Lattie Haw, MD  predniSONE (DELTASONE) 20 MG tablet 3 tabs po day one, then 2 po daily x 4 days 05/20/17   Lurene Shadow, PA-C  triamcinolone cream (KENALOG) 0.1 % Apply 1 application 2 (two) times daily topically. To Right thigh for itching 05/20/17   Lurene Shadow, PA-C  valACYclovir (VALTREX) 1000 MG tablet Take 1 tablet (1,000 mg total) 3 (three) times daily by mouth. 05/20/17   Lurene Shadow, PA-C    Family History Family History  Problem Relation Age of Onset  . Heart disease Father   . Mental illness Maternal Grandmother     Social History Social History   Tobacco Use  .  Smoking status: Never Smoker  . Smokeless tobacco: Never Used  Substance Use Topics  . Alcohol use: No    Alcohol/week: 0.0 oz  . Drug use: No    Comment: Pt has hx of meth dependence. Sober and in tx since 12/26/16     Allergies   Wellbutrin [bupropion]   Review of Systems Review of Systems No sore throat + cough No pleuritic pain No wheezing + nasal congestion + post-nasal drainage No sinus pain/pressure No itchy/red eyes No earache No hemoptysis No SOB ? fever, + chills/sweats No nausea No vomiting No abdominal pain No diarrhea No urinary  symptoms No skin rash + fatigue + myalgias + headache Used OTC meds without relief   Physical Exam Triage Vital Signs ED Triage Vitals  Enc Vitals Group     BP      Pulse      Resp      Temp      Temp src      SpO2      Weight      Height      Head Circumference      Peak Flow      Pain Score      Pain Loc      Pain Edu?      Excl. in GC?    No data found.  Updated Vital Signs BP (!) 163/102 (BP Location: Left Arm)   Pulse (!) 108   Temp 100.3 F (37.9 C) (Oral)   Resp 18   Ht 5\' 10"  (1.778 m)   Wt 221 lb (100.2 kg)   SpO2 97%   BMI 31.71 kg/m   Visual Acuity Right Eye Distance:   Left Eye Distance:   Bilateral Distance:    Right Eye Near:   Left Eye Near:    Bilateral Near:     Physical Exam Nursing notes and Vital Signs reviewed. Appearance:  Patient appears stated age, and in no acute distress Eyes:  Pupils are equal, round, and reactive to light and accomodation.  Extraocular movement is intact.  Conjunctivae are not inflamed  Ears:  Canals normal.  Tympanic membranes normal.  Nose:  Mildly congested turbinates.  No sinus tenderness.  Pharynx:  Normal Neck:  Supple.  Enlarged posterior/lateral nodes are palpated bilaterally, tender to palpation on the left.   Lungs:  Clear to auscultation.  Breath sounds are equal.  Moving air well. Heart:  Regular rate and rhythm without murmurs, rubs, or gallops.  Abdomen:  Nontender without masses or hepatosplenomegaly.  Bowel sounds are present.  No CVA or flank tenderness.  Extremities:  No edema.  Skin:  No rash present.    UC Treatments / Results  Labs (all labs ordered are listed, but only abnormal results are displayed) Labs Reviewed - No data to display  EKG  EKG Interpretation None       Radiology No results found.  Procedures Procedures (including critical care time)  Medications Ordered in UC Medications - No data to display   Initial Impression / Assessment and Plan / UC Course  I  have reviewed the triage vital signs and the nursing notes.  Pertinent labs & imaging results that were available during my care of the patient were reviewed by me and considered in my medical decision making (see chart for details).    Begin Tamiflu. Take plain guaifenesin (1200mg  extended release tabs such as Mucinex) twice daily, with plenty of water, for cough and congestion.  May add  Pseudoephedrine (30mg , one or two every 4 to 6 hours) for sinus congestion.  Get adequate rest.   May use Afrin nasal spray (or generic oxymetazoline) each morning for about 5 days and then discontinue.  Also recommend using saline nasal spray several times daily and saline nasal irrigation (AYR is a common brand).  Use Flonase nasal spray each morning after using Afrin nasal spray and saline nasal irrigation. Try warm salt water gargles for sore throat.  Stop all antihistamines for now, and other non-prescription cough/cold preparations. May take Ibuprofen 200mg , 4 tabs every 8 hours with food for body aches, fever, etc. May take Delsym Cough Suppressant at bedtime for nighttime cough.  Followup with Family Doctor if not improved in one week.     Final Clinical Impressions(s) / UC Diagnoses   Final diagnoses:  Influenza-like illness    ED Discharge Orders        Ordered    oseltamivir (TAMIFLU) 75 MG capsule  Every 12 hours     05/27/17 1625           Lattie HawBeese, Lio Wehrly A, MD 05/29/17 2026

## 2017-06-06 ENCOUNTER — Other Ambulatory Visit (HOSPITAL_COMMUNITY): Payer: Self-pay | Admitting: Medical

## 2017-06-21 IMAGING — CR DG FOOT COMPLETE 3+V*L*
3 series · 3 of 3 positions shown · non-contrast
Comparison: None.

CLINICAL DATA: Trip and fall with left foot pain, initial encounter

EXAM:
LEFT FOOT - COMPLETE 3+ VIEW

[x foot ap left]
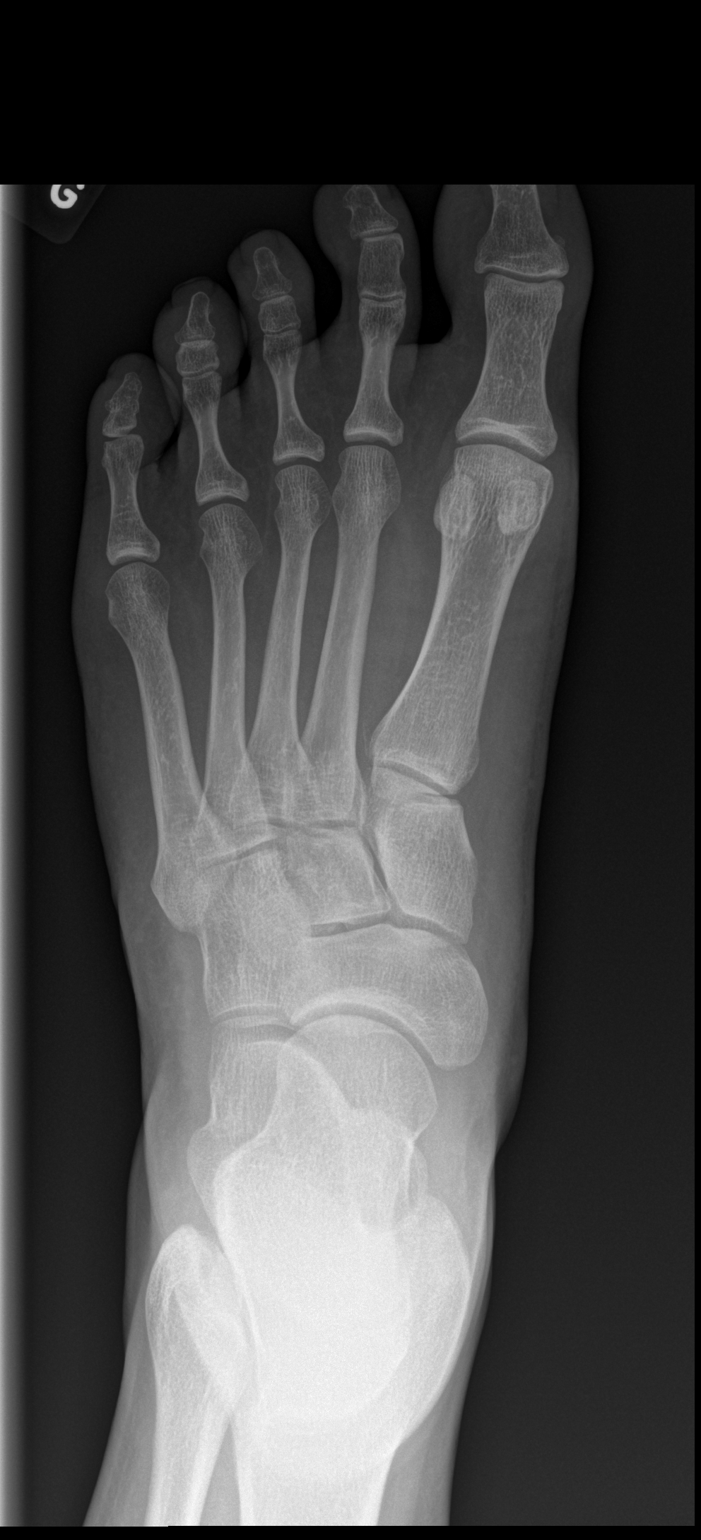

[x foot obl left]
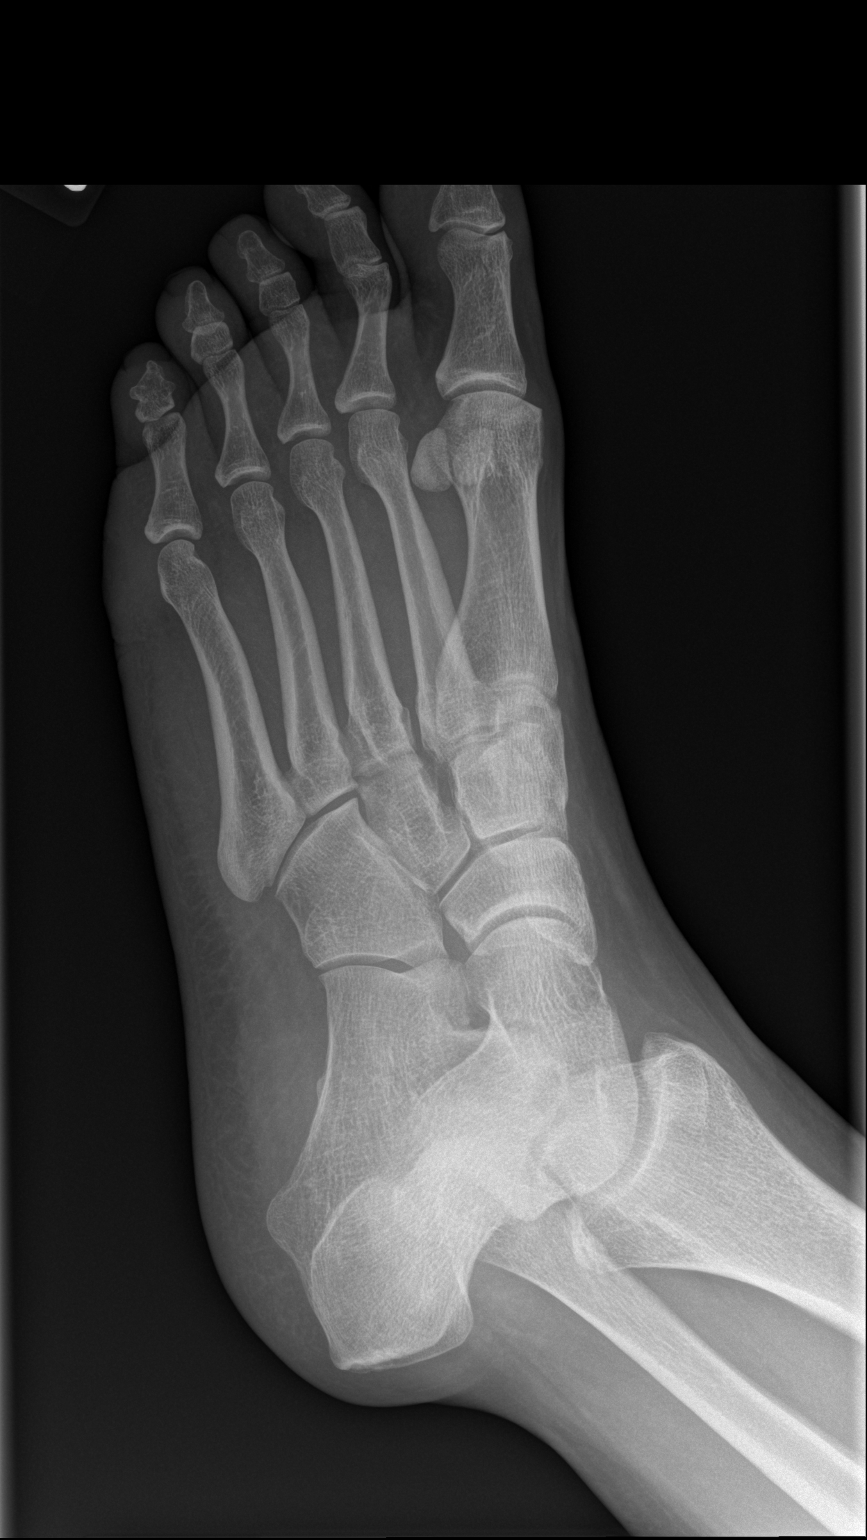

[x foot lat left]
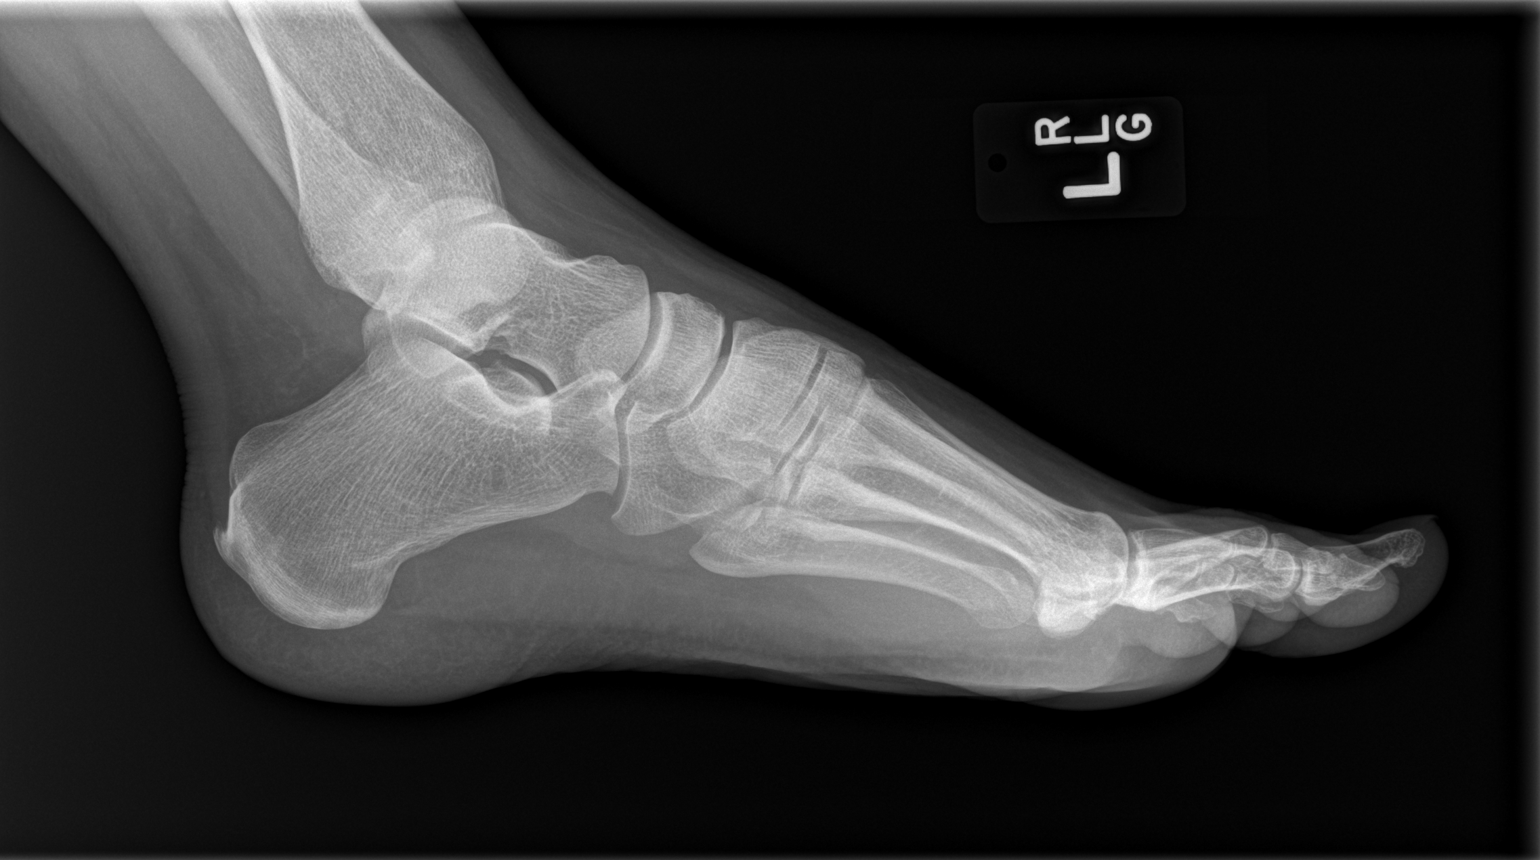

[3 of 3 positions shown; findings below may reference images not displayed]

FINDINGS: There is no evidence of fracture or dislocation. There is no
evidence of arthropathy or other focal bone abnormality. Soft
tissues are unremarkable.
IMPRESSION: No acute abnormality

## 2017-06-24 ENCOUNTER — Ambulatory Visit: Payer: Self-pay | Admitting: Dietician

## 2017-06-25 ENCOUNTER — Ambulatory Visit (INDEPENDENT_AMBULATORY_CARE_PROVIDER_SITE_OTHER): Payer: Self-pay | Admitting: Psychiatry

## 2017-06-25 VITALS — BP 128/76 | HR 82 | Ht 70.0 in | Wt 218.0 lb

## 2017-06-25 DIAGNOSIS — Z818 Family history of other mental and behavioral disorders: Secondary | ICD-10-CM

## 2017-06-25 DIAGNOSIS — Z8659 Personal history of other mental and behavioral disorders: Secondary | ICD-10-CM

## 2017-06-25 MED ORDER — LAMOTRIGINE 100 MG PO TABS: 100.0000 mg | ORAL_TABLET | Freq: Every day | ORAL | 3 refills | Status: DC

## 2017-06-25 MED ORDER — BACLOFEN 20 MG PO TABS
20.0000 mg | ORAL_TABLET | Freq: Two times a day (BID) | ORAL | 3 refills | Status: DC
Start: 2017-06-25 — End: 2018-04-10

## 2017-06-25 MED ORDER — FLUOXETINE HCL 40 MG PO CAPS: 40.0000 mg | ORAL_CAPSULE | Freq: Every day | ORAL | 3 refills | Status: DC
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
## 2017-07-17 ENCOUNTER — Ambulatory Visit: Payer: Self-pay | Admitting: Internal Medicine

## 2017-07-22 ENCOUNTER — Ambulatory Visit: Payer: Self-pay | Admitting: Dietician

## 2017-09-08 ENCOUNTER — Ambulatory Visit (INDEPENDENT_AMBULATORY_CARE_PROVIDER_SITE_OTHER): Payer: Self-pay | Admitting: Internal Medicine

## 2017-09-08 ENCOUNTER — Encounter: Payer: Self-pay | Admitting: Internal Medicine

## 2017-09-08 VITALS — BP 111/72 | HR 70 | Temp 98.8°F | Ht 70.0 in | Wt 208.0 lb

## 2017-09-08 DIAGNOSIS — N182 Chronic kidney disease, stage 2 (mild): Secondary | ICD-10-CM

## 2017-09-08 DIAGNOSIS — B20 Human immunodeficiency virus [HIV] disease: Secondary | ICD-10-CM

## 2017-09-08 DIAGNOSIS — F1910 Other psychoactive substance abuse, uncomplicated: Secondary | ICD-10-CM

## 2017-09-08 DIAGNOSIS — F329 Major depressive disorder, single episode, unspecified: Secondary | ICD-10-CM

## 2017-09-08 DIAGNOSIS — F32A Depression, unspecified: Secondary | ICD-10-CM

## 2017-09-08 MED ORDER — BICTEGRAVIR-EMTRICITAB-TENOFOV 50-200-25 MG PO TABS: 1.0000 | ORAL_TABLET | Freq: Every day | ORAL | 11 refills | Status: DC

## 2017-09-08 NOTE — Progress Notes (Signed)
RFV: follow up for hiv disease  Patient ID: Jimmy Davis, male   DOB: 25-Apr-1981, 37 y.o.   MRN: [id number]  HPI Jimmy Davis is 37yo M with hiv disease, depression, hx of meth use, has finished treatment substance abuse program this past year. CD 4 count of 660/<20 in Oct 2018. He reports that he Last meth last week. He is trying to get back into care with the treatment program and counseling. Started to see psychiatrist dr Jimmy Davis in dec 2018. He is currently not working but living on his own. Looking for work so that he can keep his place.  Outpatient Encounter Medications as of 09/08/2017  Medication Sig  . Bictegravir-Emtricitab-Tenofov (BIKTARVY PO) Take by mouth.  Marland Kitchen. FLUoxetine (PROZAC) 40 MG capsule TAKE ONE CAPSULE BY MOUTH DAILY  . lamoTRIgine (LAMICTAL) 100 MG tablet Take 1 tablet (100 mg total) by mouth daily.  Marland Kitchen. acetaminophen (TYLENOL) 500 MG tablet Take 1 tablet (500 mg total) by mouth every 6 (six) hours as needed for mild pain or moderate pain.  . baclofen (LIORESAL) 20 MG tablet Take 1 tablet (20 mg total) by mouth 2 (two) times daily. (Patient not taking: Reported on 09/08/2017)  . cetirizine (ZYRTEC) 10 MG tablet Take 1 tablet (10 mg total) by mouth daily. (Patient not taking: Reported on 09/08/2017)  . FLUoxetine (PROZAC) 40 MG capsule Take 1 capsule (40 mg total) by mouth daily.  . GENVOYA 150-150-200-10 MG TABS tablet TK 1 T PO D WITH BRE  . lidocaine (XYLOCAINE) 5 % ointment Apply 1 application 3 (three) times daily as needed topically. To back and underarm for pain and itching  . Multiple Vitamin (MULTIVITAMIN) capsule Take 1 capsule by mouth daily. (Patient not taking: Reported on 09/08/2017)  . oseltamivir (TAMIFLU) 75 MG capsule Take 1 capsule (75 mg total) by mouth every 12 (twelve) hours. (Patient not taking: Reported on 09/08/2017)  . predniSONE (DELTASONE) 20 MG tablet 3 tabs po day one, then 2 po daily x 4 days (Patient not taking: Reported on 09/08/2017)  . triamcinolone  cream (KENALOG) 0.1 % Apply 1 application 2 (two) times daily topically. To Right thigh for itching (Patient not taking: Reported on 09/08/2017)  . valACYclovir (VALTREX) 1000 MG tablet Take 1 tablet (1,000 mg total) 3 (three) times daily by mouth. (Patient not taking: Reported on 09/08/2017)   No facility-administered encounter medications on file as of 09/08/2017.      Patient Active Problem List   Diagnosis Date Noted  . Obesity (BMI 30.0-34.9) 03/26/2017  . Obsessive-compulsive behavior 12/13/2016  . Anxiety 12/11/2016  . Methamphetamine use disorder, severe, dependence (HCC) 12/09/2016  . Major depressive disorder, recurrent episode, moderate (HCC) 11/17/2015  . 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 Depression 10/03/2014  . Human immunodeficiency virus (HIV) disease (HCC) 06/29/2013     Health Maintenance Due  Topic Date Due  . TETANUS/TDAP  07/02/1998     Review of Systems + cravings to use drugs. Depression.no SI. 12 point ros is otherwise negative Physical Exam   BP 111/72   Pulse 70   Temp 98.8 F (37.1 C) (Oral)   Ht 5\' 10"  (1.778 m)   Wt 208 lb (94.3 kg)   BMI 29.84 kg/m   Physical Exam  Constitutional: He is oriented to person, place, and time. He appears well-developed and well-nourished. No distress.  HENT:  Mouth/Throat: Oropharynx is clear and moist. No oropharyngeal exudate.  Cardiovascular: Normal rate, regular rhythm and normal heart sounds. Exam reveals no gallop and no  friction rub.  No murmur heard.  Pulmonary/Chest: Effort normal and breath sounds normal. No respiratory distress. He has no wheezes.  Abdominal: Soft. Bowel sounds are normal. He exhibits no distension. There is no tenderness.  Lymphadenopathy:  He has no cervical adenopathy.  Neurological: He is alert and oriented to person, place, and time.  Skin: Skin is warm and dry. No rash noted. No erythema.  Psychiatric: He has a normal mood and affect. His behavior is normal.    Lab Results    Component Value Date   CD4TCELL 27 (L) 04/08/2017   Lab Results  Component Value Date   CD4TABS 660 04/08/2017   CD4TABS 580 09/12/2016   CD4TABS 520 04/18/2016   Lab Results  Component Value Date   HIV1RNAQUANT <20 DETECTED (A) 04/08/2017   Lab Results  Component Value Date   HEPBSAB NEG 08/17/2013   Lab Results  Component Value Date   LABRPR NON-REACTIVE 04/08/2017    CBC Lab Results  Component Value Date   WBC 6.5 04/08/2017   RBC 4.91 04/08/2017   HGB 14.8 04/08/2017   HCT 43.1 04/08/2017   PLT 242 04/08/2017   MCV 87.8 04/08/2017   MCH 30.1 04/08/2017   MCHC 34.3 04/08/2017   RDW 12.7 04/08/2017   LYMPHSABS 2,347 04/08/2017   MONOABS 408 04/18/2016   EOSABS 163 04/08/2017    BMET Lab Results  Component Value Date   NA 139 04/08/2017   K 4.2 04/08/2017   CL 102 04/08/2017   CO2 27 04/08/2017   GLUCOSE 71 04/08/2017   BUN 18 04/08/2017   CREATININE 1.42 (H) 04/08/2017   CALCIUM 9.5 04/08/2017   GFRNONAA 63 04/08/2017   GFRAA 73 04/08/2017    Assessment and Plan  HIV disease= well controlled, continue on current regimen  Substance abuse = encouraged him to get back in his program since he did maintain sobriety for several months last year.   Depression = seeing new psychiatrist.  ckd 2= stable

## 2017-09-09 LAB — CBC WITH DIFFERENTIAL/PLATELET
BASOS ABS: 41 {cells}/uL (ref 0–200)
Basophils Relative: 0.7 %
EOS ABS: 366 {cells}/uL (ref 15–500)
EOS PCT: 6.2 %
HEMATOCRIT: 43.4 % (ref 38.5–50.0)
Hemoglobin: 15.4 g/dL (ref 13.2–17.1)
Lymphs Abs: 2272 cells/uL (ref 850–3900)
MCH: 31.1 pg (ref 27.0–33.0)
MCHC: 35.5 g/dL (ref 32.0–36.0)
MCV: 87.7 fL (ref 80.0–100.0)
MONOS PCT: 8.5 %
MPV: 9.2 fL (ref 7.5–12.5)
NEUTROS ABS: 2720 {cells}/uL (ref 1500–7800)
NEUTROS PCT: 46.1 %
Platelets: 271 10*3/uL (ref 140–400)
RBC: 4.95 10*6/uL (ref 4.20–5.80)
RDW: 13 % (ref 11.0–15.0)
Total Lymphocyte: 38.5 %
WBC mixed population: 502 cells/uL (ref 200–950)
WBC: 5.9 10*3/uL (ref 3.8–10.8)

## 2017-09-09 LAB — COMPLETE METABOLIC PANEL WITH GFR
AG RATIO: 1.7 (calc) (ref 1.0–2.5)
ALBUMIN MSPROF: 4.6 g/dL (ref 3.6–5.1)
ALT: 22 U/L (ref 9–46)
AST: 16 U/L (ref 10–40)
Alkaline phosphatase (APISO): 82 U/L (ref 40–115)
BUN: 13 mg/dL (ref 7–25)
CALCIUM: 9.5 mg/dL (ref 8.6–10.3)
CO2: 28 mmol/L (ref 20–32)
Chloride: 103 mmol/L (ref 98–110)
Creat: 1.1 mg/dL (ref 0.60–1.35)
GFR, EST AFRICAN AMERICAN: 98 mL/min/{1.73_m2} (ref >=60)
GFR, EST NON AFRICAN AMERICAN: 85 mL/min/{1.73_m2} (ref >=60)
GLOBULIN: 2.7 g/dL (ref 1.9–3.7)
Glucose, Bld: 79 mg/dL (ref 65–99)
POTASSIUM: 4.5 mmol/L (ref 3.5–5.3)
SODIUM: 139 mmol/L (ref 135–146)
Total Bilirubin: 0.4 mg/dL (ref 0.2–1.2)
Total Protein: 7.3 g/dL (ref 6.1–8.1)

## 2017-09-09 LAB — RPR: RPR Ser Ql: NONREACTIVE

## 2017-09-10 LAB — HIV-1 RNA QUANT-NO REFLEX-BLD
HIV 1 RNA QUANT: NOT DETECTED {copies}/mL
HIV-1 RNA QUANT, LOG: NOT DETECTED {Log_copies}/mL

## 2017-09-10 LAB — T-HELPER CELL (CD4) - (RCID CLINIC ONLY)
CD4 % Helper T Cell: 27 % — ABNORMAL LOW (ref 33–55)
CD4 T Cell Abs: 650 /uL (ref 400–2700)

## 2017-09-15 ENCOUNTER — Ambulatory Visit: Payer: Self-pay

## 2017-09-19 ENCOUNTER — Ambulatory Visit: Payer: Self-pay

## 2017-09-24 ENCOUNTER — Ambulatory Visit (HOSPITAL_COMMUNITY): Payer: Self-pay | Admitting: Psychiatry

## 2017-09-25 ENCOUNTER — Encounter: Payer: Self-pay | Admitting: Internal Medicine

## 2017-09-25 ENCOUNTER — Ambulatory Visit: Payer: Medicaid Other

## 2017-09-27 DIAGNOSIS — F419 Anxiety disorder, unspecified: Secondary | ICD-10-CM | POA: Insufficient documentation

## 2017-09-27 DIAGNOSIS — F1510 Other stimulant abuse, uncomplicated: Secondary | ICD-10-CM | POA: Insufficient documentation

## 2017-09-27 DIAGNOSIS — Z79899 Other long term (current) drug therapy: Secondary | ICD-10-CM | POA: Insufficient documentation

## 2017-09-27 DIAGNOSIS — B20 Human immunodeficiency virus [HIV] disease: Secondary | ICD-10-CM | POA: Insufficient documentation

## 2017-09-27 DIAGNOSIS — Z7982 Long term (current) use of aspirin: Secondary | ICD-10-CM | POA: Insufficient documentation

## 2017-09-28 ENCOUNTER — Emergency Department (HOSPITAL_COMMUNITY)
Admission: EM | Admit: 2017-09-28 | Discharge: 2017-09-28 | Disposition: A | Discharge status: Home / Self Care | Payer: Medicaid Other | Attending: Emergency Medicine | Admitting: Emergency Medicine

## 2017-09-28 ENCOUNTER — Encounter (HOSPITAL_COMMUNITY): Payer: Self-pay

## 2017-09-28 DIAGNOSIS — F1510 Other stimulant abuse, uncomplicated: Secondary | ICD-10-CM

## 2017-09-28 DIAGNOSIS — R251 Tremor, unspecified: Secondary | ICD-10-CM

## 2017-09-28 DIAGNOSIS — F419 Anxiety disorder, unspecified: Secondary | ICD-10-CM

## 2017-09-28 LAB — CBC WITH DIFFERENTIAL/PLATELET
BASOS ABS: 0 10*3/uL (ref 0.0–0.1)
Basophils Relative: 0 %
EOS ABS: 0.2 10*3/uL (ref 0.0–0.7)
EOS PCT: 3 %
HCT: 41.7 % (ref 39.0–52.0)
Hemoglobin: 14.9 g/dL (ref 13.0–17.0)
LYMPHS ABS: 3 10*3/uL (ref 0.7–4.0)
Lymphocytes Relative: 43 %
MCH: 31.6 pg (ref 26.0–34.0)
MCHC: 35.7 g/dL (ref 30.0–36.0)
MCV: 88.3 fL (ref 78.0–100.0)
MONO ABS: 0.6 10*3/uL (ref 0.1–1.0)
Monocytes Relative: 8 %
Neutro Abs: 3.2 10*3/uL (ref 1.7–7.7)
Neutrophils Relative %: 46 %
PLATELETS: 241 10*3/uL (ref 150–400)
RBC: 4.72 MIL/uL (ref 4.22–5.81)
RDW: 12.6 % (ref 11.5–15.5)
WBC: 7 10*3/uL (ref 4.0–10.5)

## 2017-09-28 LAB — COMPREHENSIVE METABOLIC PANEL
ALT: 23 U/L (ref 17–63)
AST: 21 U/L (ref 15–41)
Albumin: 4.4 g/dL (ref 3.5–5.0)
Alkaline Phosphatase: 93 U/L (ref 38–126)
Anion gap: 10 (ref 5–15)
BUN: 15 mg/dL (ref 6–20)
CHLORIDE: 104 mmol/L (ref 101–111)
CO2: 25 mmol/L (ref 22–32)
CREATININE: 1.13 mg/dL (ref 0.61–1.24)
Calcium: 9.4 mg/dL (ref 8.9–10.3)
GFR calc Af Amer: >60 mL/min (ref >60)
GFR calc non Af Amer: >60 mL/min (ref >60)
Glucose, Bld: 91 mg/dL (ref 65–99)
POTASSIUM: 3.9 mmol/L (ref 3.5–5.1)
SODIUM: 139 mmol/L (ref 135–145)
Total Bilirubin: 0.4 mg/dL (ref 0.3–1.2)
Total Protein: 7.7 g/dL (ref 6.5–8.1)

## 2017-09-28 LAB — RAPID URINE DRUG SCREEN, HOSP PERFORMED
Amphetamines: POSITIVE — AB
BARBITURATES: NOT DETECTED
BENZODIAZEPINES: NOT DETECTED
Cocaine: NOT DETECTED
Opiates: NOT DETECTED
Tetrahydrocannabinol: NOT DETECTED

## 2017-09-28 LAB — URINALYSIS, ROUTINE W REFLEX MICROSCOPIC
Bacteria, UA: NONE SEEN
Bilirubin Urine: NEGATIVE
Glucose, UA: NEGATIVE mg/dL
Hgb urine dipstick: NEGATIVE
Ketones, ur: NEGATIVE mg/dL
Leukocytes, UA: NEGATIVE
Nitrite: NEGATIVE
Protein, ur: NEGATIVE mg/dL
Specific Gravity, Urine: 1.011 (ref 1.005–1.030)
pH: 7 (ref 5.0–8.0)

## 2017-09-28 LAB — ETHANOL

## 2017-09-28 LAB — CK: CK TOTAL: 195 U/L (ref 49–397)

## 2017-09-28 MED ORDER — LORAZEPAM 1 MG PO TABS
1.0000 mg | ORAL_TABLET | Freq: Once | ORAL | Status: AC
  Administered 2017-09-28: 1 mg via ORAL
  Filled 2017-09-28: qty 1

## 2017-09-28 NOTE — ED Notes (Signed)
Pt given turkey sandwich and diet coke  

## 2017-09-28 NOTE — Discharge Instructions (Addendum)
1. Medications: usual home medications 2. Treatment: rest, drink plenty of fluids,  3. Follow Up: Please followup with your primary doctor in 2-3 days for discussion of your diagnoses and further evaluation after today's visit; if you do not have a primary care doctor use the resource guide provided to find one; Please return to the ER for worsening symptoms, chest pain, shortness of breath or other concerns.

## 2017-09-28 NOTE — ED Provider Notes (Signed)
[facility]-EMERGENCY DEPT Provider Note   CSN: [id number] Arrival date & time: 09/27/17  2359     History   Chief Complaint Chief Complaint  Patient presents with  . Tremors    HPI Jimmy Davis is a 37 y.o. male with a hx of anxiety, depression, HIV, substance abuse (meth) presents to the Emergency Department complaining of gradual, persistent, progressively worsening tremors, onset several hours ago.  Patient reports he did indeed use meth tonight prior to the onset of these.  He reports he uses meth every couple of weeks.  He has not changed dealers however he does think he used more than usual.  Patient states he has often been getting tremors after meth usage but the symptoms seem to be getting worse each time he uses.  He reports that tonight he was unable to sleep because of his tremors.  He states a history of hypokalemia and was concerned that this might be the cause.  He denies headache, neck pain, chest pain, shortness of breath, abdominal pain, nausea, vomiting, diarrhea, weakness, dizziness, syncope.  Patient denies vision changes, loss of bowel or bladder control.  He reports he has been able to walk without difficulty.  Reports compliance with his HIV medications.   Record review shows a CD4 count on 09/08/2017 was 27.  At that time, HIV quant was undetectable.   The history is provided by the patient and medical records. No language interpreter was used.    Past Medical History:  Diagnosis Date  . Anxiety   . Depression   . Human immunodeficiency virus (HIV) disease (HCC)   . 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 Substance abuse [facility])     Patient Active Problem List   Diagnosis Date Noted  . Obesity (BMI 30.0-34.9) 03/26/2017  . Obsessive-compulsive behavior 12/13/2016  . Anxiety 12/11/2016  . Methamphetamine use disorder, severe, dependence (HCC) 12/09/2016  . Major depressive disorder, recurrent episode, moderate (HCC) 11/17/2015  .  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 Depression 10/03/2014  . Human immunodeficiency virus (HIV) disease (HCC) 06/29/2013    Past Surgical History:  Procedure Laterality Date  . WISDOM TOOTH EXTRACTION          Home Medications    Prior to Admission medications   Medication Sig Start Date End Date Taking? Authorizing Provider  acetaminophen (TYLENOL) 500 MG tablet Take 1 tablet (500 mg total) by mouth every 6 (six) hours as needed for mild pain or moderate pain. 10/23/16  Yes Fayrene Helper, PA-C  aspirin 325 MG EC tablet Take 325 mg by mouth every 4 (four) hours as needed for pain.   Yes [provider]  bictegravir-emtricitabine-tenofovir AF (BIKTARVY) 50-200-25 MG TABS tablet Take 1 tablet by mouth daily. 09/08/17  Yes Judyann Munson, MD  cetirizine (ZYRTEC) 10 MG tablet Take 1 tablet (10 mg total) by mouth daily. Patient taking differently: Take 10 mg by mouth daily as needed for allergies.  06/28/15  Yes Judyann Munson, MD  FLUoxetine (PROZAC) 40 MG capsule Take 1 capsule (40 mg total) by mouth daily. 06/25/17  Yes Plovsky, Earvin Hansen, MD  lamoTRIgine (LAMICTAL) 100 MG tablet Take 1 tablet (100 mg total) by mouth daily. 06/25/17 07/25/18 Yes Plovsky, Earvin Hansen, MD  baclofen (LIORESAL) 20 MG tablet Take 1 tablet (20 mg total) by mouth 2 (two) times daily. Patient not taking: Reported on 09/08/2017 06/25/17 06/25/18  Archer Asa, MD  lidocaine (XYLOCAINE) 5 % ointment Apply 1 application 3 (three) times daily as needed topically.  To back and underarm for pain and itching Patient not taking: Reported on 09/28/2017 05/20/17   Lurene Shadow, PA-C  Multiple Vitamin (MULTIVITAMIN) capsule Take 1 capsule by mouth daily. Patient not taking: Reported on 09/08/2017 11/17/15   Thermon Leyland, NP  oseltamivir (TAMIFLU) 75 MG capsule Take 1 capsule (75 mg total) by mouth every 12 (twelve) hours. Patient not taking: Reported on 09/08/2017 05/27/17   Lattie Haw, MD  predniSONE (DELTASONE) 20 MG  tablet 3 tabs po day one, then 2 po daily x 4 days Patient not taking: Reported on 09/08/2017 05/20/17   Lurene Shadow, PA-C  triamcinolone cream (KENALOG) 0.1 % Apply 1 application 2 (two) times daily topically. To Right thigh for itching Patient not taking: Reported on 09/08/2017 05/20/17   Lurene Shadow, PA-C  valACYclovir (VALTREX) 1000 MG tablet Take 1 tablet (1,000 mg total) 3 (three) times daily by mouth. Patient not taking: Reported on 09/08/2017 05/20/17   Rolla Plate    Family History Family History  Problem Relation Age of Onset  . Heart disease Father   . Mental illness Maternal Grandmother     Social History Social History   Tobacco Use  . Smoking status: Never Smoker  . Smokeless tobacco: Never Used  Substance Use Topics  . Alcohol use: No    Alcohol/week: 0.0 oz  . Drug use: No    Frequency: 4.0 times per week    Types: Methamphetamines    Comment: Pt has hx of meth dependence. Sober and in tx since 12/26/16     Allergies   Wellbutrin [bupropion]   Review of Systems Review of Systems  Constitutional: Negative for appetite change, diaphoresis, fatigue, fever and unexpected weight change.  HENT: Negative for mouth sores.   Eyes: Negative for visual disturbance.  Respiratory: Negative for cough, chest tightness, shortness of breath and wheezing.   Cardiovascular: Negative for chest pain.  Gastrointestinal: Negative for abdominal pain, constipation, diarrhea, nausea and vomiting.  Endocrine: Negative for polydipsia, polyphagia and polyuria.  Genitourinary: Negative for dysuria, frequency, hematuria and urgency.  Musculoskeletal: Negative for back pain and neck stiffness.  Skin: Negative for rash.  Allergic/Immunologic: Negative for immunocompromised state.  Neurological: Positive for tremors. Negative for syncope, light-headedness and headaches.  Hematological: Does not bruise/bleed easily.  Psychiatric/Behavioral: Negative for sleep disturbance. The  patient is not nervous/anxious.      Physical Exam Updated Vital Signs BP (!) 132/100 (BP Location: Left Arm)   Pulse 93   Temp 98.6 F (37 C) (Oral)   Resp 18   SpO2 100%   Physical Exam  Constitutional: He is oriented to person, place, and time. He appears well-developed and well-nourished. No distress.  HENT:  Head: Normocephalic and atraumatic.  Mouth/Throat: Oropharynx is clear and moist.  Eyes: Pupils are equal, round, and reactive to light. Conjunctivae and EOM are normal. No scleral icterus.  No horizontal, vertical or rotational nystagmus  Neck: Normal range of motion. Neck supple.  Full active and passive ROM without pain No midline or paraspinal tenderness No nuchal rigidity or meningeal signs  Cardiovascular: Normal rate, regular rhythm and intact distal pulses.  Pulmonary/Chest: Effort normal and breath sounds normal. No respiratory distress. He has no wheezes. He has no rales.  Abdominal: Soft. Bowel sounds are normal. There is no tenderness. There is no rebound and no guarding.  Musculoskeletal: Normal range of motion.  Lymphadenopathy:    He has no cervical adenopathy.  Neurological: He is alert  and oriented to person, place, and time. He displays tremor. No cranial nerve deficit. He exhibits normal muscle tone. Coordination normal.  Mental Status:  Alert, oriented, thought content appropriate. Speech fluent without evidence of aphasia. Able to follow 2 step commands without difficulty.  Cranial Nerves:  II:  Peripheral visual fields grossly normal, pupils equal, round, reactive to light III,IV, VI: ptosis not present, extra-ocular motions intact bilaterally  V,VII: smile symmetric, facial light touch sensation equal VIII: hearing grossly normal bilaterally  IX,X: midline uvula rise  XI: bilateral shoulder shrug equal and strong XII: midline tongue extension  Motor:  5/5 in upper and lower extremities bilaterally including strong and equal grip strength and  dorsiflexion/plantar flexion Patient with tremors at rest Sensory: Pinprick and light touch normal in all extremities.  Cerebellar: normal finger-to-nose with bilateral upper extremities Gait: normal gait and balance CV: distal pulses palpable throughout   Skin: Skin is warm and dry. No rash noted. He is not diaphoretic.  Psychiatric: He has a normal mood and affect. His behavior is normal. Judgment and thought content normal.  Nursing note and vitals reviewed.    ED Treatments / Results  Labs (all labs ordered are listed, but only abnormal results are displayed) Labs Reviewed  URINALYSIS, ROUTINE W REFLEX MICROSCOPIC - Abnormal; Notable for the following components:      Result Value   Squamous Epithelial / LPF 0-5 (*)    All other components within normal limits  CBC WITH DIFFERENTIAL/PLATELET  COMPREHENSIVE METABOLIC PANEL  ETHANOL  CK  RAPID URINE DRUG SCREEN, HOSP PERFORMED    EKG None  Radiology No results found.  Procedures Procedures (including critical care time)  Medications Ordered in ED Medications  LORazepam (ATIVAN) tablet 1 mg (1 mg Oral Given 09/28/17 0604)     Initial Impression / Assessment and Plan / ED Course  I have reviewed the triage vital signs and the nursing notes.  Pertinent labs & imaging results that were available during my care of the patient were reviewed by me and considered in my medical decision making (see chart for details).  Clinical Course as of Sep 29 726  Wynelle Link Sep 28, 2017  0605 He reports he is feeling better.  His tachycardia has improved.  His tremors have improved significantly.   [HM]  [id number] Patient hypertensive at triage however this is improved spontaneously.  BP(!): 141/103 [HM]    Clinical Course User Index [HM] Broughton Eppinger, Dahlia Client, PA-C    Patient presents with tremors after methamphetamine usage.  He is well-appearing and otherwise neurologically intact.  Labs are reassuring.  No hypokalemia.  No evidence of  leukocytosis.  Urinalysis without evidence of urinary tract infection.  Ethyl alcohol negative.  Highly doubt intracranial lesion.  Will give Ativan and reassess.  6:10 AM Patient's tremors have almost completely resolved.  He is ambulatory without assistance in the room with steady gait.  Tachycardia and hypotension have also resolved.  Patient will be discharged home.  Long discussion about methamphetamine usage and its side effects.  States understanding.  He is in agreement with the plan for discharge home.   BP 120/86 (BP Location: Right Arm)   Pulse 99   Temp 98.6 F (37 C) (Oral)   Resp 17   SpO2 100%   Final Clinical Impressions(s) / ED Diagnoses   Final diagnoses:  Anxiety  Tremor  Methamphetamine use [facility])    ED Discharge Orders    None       Opha Mcghee, Dahlia Client, PA-C  09/28/17 [id number]    Glynn Octaveancour, Stephen, MD 09/28/17 [phone]

## 2017-09-28 NOTE — ED Notes (Signed)
Bed: [facility]2 Expected date:  Expected time:  Means of arrival:  Comments: 

## 2017-10-03 ENCOUNTER — Ambulatory Visit (HOSPITAL_COMMUNITY): Payer: Self-pay | Admitting: Psychiatry

## 2017-11-04 ENCOUNTER — Ambulatory Visit (INDEPENDENT_AMBULATORY_CARE_PROVIDER_SITE_OTHER): Payer: Self-pay | Admitting: Psychiatry

## 2017-11-04 ENCOUNTER — Encounter (HOSPITAL_COMMUNITY): Payer: Self-pay | Admitting: Psychiatry

## 2017-11-04 VITALS — BP 119/81 | HR 78 | Ht 70.0 in | Wt 209.0 lb

## 2017-11-04 DIAGNOSIS — Z818 Family history of other mental and behavioral disorders: Secondary | ICD-10-CM

## 2017-11-04 DIAGNOSIS — F320 Major depressive disorder, single episode, mild: Secondary | ICD-10-CM

## 2017-11-04 MED ORDER — LAMOTRIGINE 100 MG PO TABS: 100.0000 mg | ORAL_TABLET | Freq: Every day | ORAL | 3 refills | Status: DC

## 2017-11-04 MED ORDER — FLUOXETINE HCL 40 MG PO CAPS: 40.0000 mg | ORAL_CAPSULE | Freq: Every day | ORAL | 2 refills | Status: DC

## 2017-11-04 NOTE — Progress Notes (Signed)
5/10

## 2017-11-04 NOTE — Progress Notes (Signed)
Psychiatric Initial Adult Assessment   Patient Identification: Jimmy Davis MRN:  [id number] Date of Evaluation:  11/04/2017 Referral Source: hemical dependency IOP Chief Complaint:   Visit Diagnosis: No diagnosis found.  History of Present Illness:  Today the patient is seen one time for 15 visit. He's not been here in many months. He is relapsing. He denies the use of opiates or any other substances. Used about a week ago and days before that as well. He denies really being all that depressed. He is mild anxiety. He actually sleeping and eating fairly well. Still enjoys television and other activities. Is looking for a job. Still lives independently with his mother pain for his apartment. I will daily. Prozac. Patient seems to be very apathetic about his treatment although is here today because he wants to continue his psychiatric care. Patient is agreed to return back to our substance abuse system. He's agreed to go back to NA her some support group. He's agreed to return to his medications. Patient is not suicidal. His HIV status is stable. He shows no evidence of psychosis he is restless in the center.  Associated Signs/Symptoms: Depression Symptoms:  anxiety, (Hypo) Manic Symptoms:   Anxiety Symptoms:   Psychotic Symptoms:   PTSD Symptoms:   Past Psychiatric History: 3 psychiatric hospitalizations multiple psychiatric medications  Previous Psychotropic Medications: yes  Substance Abuse History in the last 12 months:  Yes.    Consequences of Substance Abuse: Negative  Past Medical History:  Past Medical History:  Diagnosis Date  . Anxiety   . Depression   . Human immunodeficiency virus (HIV) disease (HCC)   . 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 Substance abuse [facility])     Past Surgical History:  Procedure Laterality Date  . WISDOM TOOTH EXTRACTION      Family Psychiatric History:   Family History:  Family History  Problem Relation Age of Onset  . Heart disease  Father   . Mental illness Maternal Grandmother     Social History:   Social History   Socioeconomic History  . Marital status: Single    Spouse name: Not on file  . Number of children: Not on file  . Years of education: Not on file  . Highest education level: Not on file  Occupational History  . Occupation: Secretary/administrator: [employer]  Social Needs  . Financial resource strain: Not on file  . Food insecurity:    Worry: Not on file    Inability: Not on file  . Transportation needs:    Medical: Not on file    Non-medical: Not on file  Tobacco Use  . Smoking status: Never Smoker  . Smokeless tobacco: Never Used  Substance and Sexual Activity  . Alcohol use: No    Alcohol/week: 0.0 oz  . Drug use: No    Frequency: 4.0 times per week    Types: Methamphetamines    Comment: Pt has hx of meth dependence. Sober and in tx since 12/26/16  . Sexual activity: Yes    Partners: Male    Birth control/protection: None, Condom    Comment: declined condoms  Lifestyle  . Physical activity:    Days per week: Not on file    Minutes per session: Not on file  . Stress: Not on file  Relationships  . Social connections:    Talks on phone: Not on file    Gets together: Not on file    Attends religious service: Not on  file    Active member of club or organization: Not on file    Attends meetings of clubs or organizations: Not on file    Relationship status: Not on file  Other Topics Concern  . Not on file  Social History Narrative   Single, 5-6 caffeinated beverages daily previous but not current illicit drugs   Employed in Audiological scientist business    Additional Social History:    Allergies:   Allergies  Allergen Reactions  . Wellbutrin [Bupropion] Other (See Comments)    Psychotic reaction    Metabolic Disorder Labs: Lab Results  Component Value Date   HGBA1C 5.2 12/07/2014   MPG 103 12/07/2014   No results found for: PROLACTIN Lab Results  Component Value  Date   CHOL 214 (H) 09/12/2016   TRIG 139 09/12/2016   HDL 48 09/12/2016   CHOLHDL 4.5 09/12/2016   VLDL 28 09/12/2016   LDLCALC 138 (H) 09/12/2016   LDLCALC 107 09/19/2015     Current Medications: Current Outpatient Medications  Medication Sig Dispense Refill  . acetaminophen (TYLENOL) 500 MG tablet Take 1 tablet (500 mg total) by mouth every 6 (six) hours as needed for mild pain or moderate pain. 30 tablet 0  . aspirin 325 MG EC tablet Take 325 mg by mouth every 4 (four) hours as needed for pain.    . baclofen (LIORESAL) 20 MG tablet Take 1 tablet (20 mg total) by mouth 2 (two) times daily. 60 tablet 3  . bictegravir-emtricitabine-tenofovir AF (BIKTARVY) 50-200-25 MG TABS tablet Take 1 tablet by mouth daily. 30 tablet 11  . cetirizine (ZYRTEC) 10 MG tablet Take 1 tablet (10 mg total) by mouth daily. (Patient taking differently: Take 10 mg by mouth daily as needed for allergies. ) 30 tablet 3  . FLUoxetine (PROZAC) 40 MG capsule Take 1 capsule (40 mg total) by mouth daily. 30 capsule 2  . lamoTRIgine (LAMICTAL) 100 MG tablet Take 1 tablet (100 mg total) by mouth daily. 30 tablet 3  . Multiple Vitamin (MULTIVITAMIN) capsule Take 1 capsule by mouth daily.    Marland Kitchen oseltamivir (TAMIFLU) 75 MG capsule Take 1 capsule (75 mg total) by mouth every 12 (twelve) hours. 10 capsule 0   No current facility-administered medications for this visit.     Neurologic: Headache: No Seizure: No Paresthesias:No  Musculoskeletal: Strength & Muscle Tone: within normal limits Gait & Station: normal Patient leans: N/A  Psychiatric Specialty Exam: ROS  Blood pressure 119/81, pulse 78, height  (1.778 m), weight 209 lb (94.8 kg), SpO2 100 %.Body mass index is 29.99 kg/m.  General Appearance: Casual  Eye Contact:  Good  Speech:  Clear and Coherent  Volume:  Normal  Mood:  Negative  Affect:  Appropriate  Thought Process:  Coherent  Orientation:  NA  Thought Content:  WDL  Suicidal Thoughts:  No   Homicidal Thoughts:  No  Memory:  NA  Judgement:  Good  Insight:  Good  Psychomotor Activity:  Normal  Concentration:    Recall:  Good  Fund of Knowledge:Good  Language: NA  Akathisia:  No  Handed:  Right  AIMS (if indicated):    Assets:  Desire for Improvement  ADL's:  Intact  Cognition: WNL  Sleep:      Treatment Plan Summary: At this time the patient will back to taking Prozac 40 mg, continue his Lamictal as ordered.Return back to some form of treatment for substance abuse providers in the center. This patient return to see me  just 5 or 6 weeks. The patient otherwise is fairly stable. Will return to see me in 6 weeks.  Gypsy Balsam, MD 4/30/20192:02 PM

## 2017-11-10 ENCOUNTER — Emergency Department (HOSPITAL_BASED_OUTPATIENT_CLINIC_OR_DEPARTMENT_OTHER)
Admission: EM | Admit: 2017-11-10 | Discharge: 2017-11-10 | Disposition: A | Discharge status: Home / Self Care | Payer: Medicaid Other | Source: Home / Self Care | Attending: Emergency Medicine | Admitting: Emergency Medicine

## 2017-11-10 ENCOUNTER — Emergency Department (HOSPITAL_COMMUNITY)
Admission: EM | Admit: 2017-11-10 | Discharge: 2017-11-11 | Disposition: A | Discharge status: Home / Self Care | Payer: Medicaid Other | Attending: Emergency Medicine | Admitting: Emergency Medicine

## 2017-11-10 ENCOUNTER — Encounter (HOSPITAL_COMMUNITY): Payer: Self-pay | Admitting: Emergency Medicine

## 2017-11-10 DIAGNOSIS — B20 Human immunodeficiency virus [HIV] disease: Secondary | ICD-10-CM | POA: Insufficient documentation

## 2017-11-10 DIAGNOSIS — L03116 Cellulitis of left lower limb: Secondary | ICD-10-CM | POA: Insufficient documentation

## 2017-11-10 DIAGNOSIS — L03119 Cellulitis of unspecified part of limb: Secondary | ICD-10-CM

## 2017-11-10 DIAGNOSIS — M7989 Other specified soft tissue disorders: Secondary | ICD-10-CM

## 2017-11-10 DIAGNOSIS — M79609 Pain in unspecified limb: Secondary | ICD-10-CM

## 2017-11-10 DIAGNOSIS — Z7982 Long term (current) use of aspirin: Secondary | ICD-10-CM | POA: Insufficient documentation

## 2017-11-10 LAB — I-STAT CHEM 8, ED
BUN: 12 mg/dL (ref 6–20)
CHLORIDE: 97 mmol/L — AB (ref 101–111)
CREATININE: 1.1 mg/dL (ref 0.61–1.24)
Calcium, Ion: 1.25 mmol/L (ref 1.15–1.40)
GLUCOSE: 90 mg/dL (ref 65–99)
HEMATOCRIT: 42 % (ref 39.0–52.0)
HEMOGLOBIN: 14.3 g/dL (ref 13.0–17.0)
POTASSIUM: 3.6 mmol/L (ref 3.5–5.1)
Sodium: 140 mmol/L (ref 135–145)
TCO2: 31 mmol/L (ref 22–32)

## 2017-11-10 MED ORDER — CEPHALEXIN 500 MG PO CAPS: 500.0000 mg | ORAL_CAPSULE | Freq: Four times a day (QID) | ORAL | 0 refills | Status: DC

## 2017-11-10 NOTE — ED Provider Notes (Signed)
[facility]-EMERGENCY DEPT Provider Note   CSN: [id number] Arrival date & time: 11/10/17  1102     History   Chief Complaint Chief Complaint  Patient presents with  . Leg Swelling    HPI Jimmy Davis is a 37 y.o. male.  Patient presents to ED for evaluation of redness and erythema of inner aspect of left foot and ankle, noticed earlier today. Reports spending an extended amount of time sitting at the computer overnight, and had mild pain at the back of his knee with slight limp while ambulating. No history of recent trauma/injury of leg or knee. Patient with history of HIV, followed by ID clinic. Review of records indicate CD count of 27 and undetectable viral load on 09/08/17.  The history is provided by the patient and medical records. No language interpreter was used.  Leg Pain   This is a new problem. The current episode started 12 to 24 hours ago. The pain is present in the left knee. The pain is mild. Associated symptoms include stiffness.    Past Medical History:  Diagnosis Date  . Anxiety   . Depression   . Human immunodeficiency virus (HIV) disease (HCC)   . 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 Substance abuse [facility])     Patient Active Problem List   Diagnosis Date Noted  . Obesity (BMI 30.0-34.9) 03/26/2017  . Obsessive-compulsive behavior 12/13/2016  . Anxiety 12/11/2016  . Methamphetamine use disorder, severe, dependence (HCC) 12/09/2016  . Major depressive disorder, recurrent episode, moderate (HCC) 11/17/2015  . 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 Depression 10/03/2014  . Human immunodeficiency virus (HIV) disease (HCC) 06/29/2013    Past Surgical History:  Procedure Laterality Date  . WISDOM TOOTH EXTRACTION          Home Medications    Prior to Admission medications   Medication Sig Start Date End Date Taking? Authorizing Provider  acetaminophen (TYLENOL) 500 MG tablet Take 1 tablet (500 mg total) by mouth every 6 (six)  hours as needed for mild pain or moderate pain. 10/23/16   Fayrene Helper, PA-C  aspirin 325 MG EC tablet Take 325 mg by mouth every 4 (four) hours as needed for pain.    [provider]  baclofen (LIORESAL) 20 MG tablet Take 1 tablet (20 mg total) by mouth 2 (two) times daily. 06/25/17 06/25/18  Plovsky, Earvin Hansen, MD  bictegravir-emtricitabine-tenofovir AF (BIKTARVY) 50-200-25 MG TABS tablet Take 1 tablet by mouth daily. 09/08/17   Judyann Munson, MD  cetirizine (ZYRTEC) 10 MG tablet Take 1 tablet (10 mg total) by mouth daily. Patient taking differently: Take 10 mg by mouth daily as needed for allergies.  06/28/15   Judyann Munson, MD  FLUoxetine (PROZAC) 40 MG capsule Take 1 capsule (40 mg total) by mouth daily. 11/04/17   Plovsky, Earvin Hansen, MD  lamoTRIgine (LAMICTAL) 100 MG tablet Take 1 tablet (100 mg total) by mouth daily. 11/04/17 12/04/18  Plovsky, Earvin Hansen, MD  Multiple Vitamin (MULTIVITAMIN) capsule Take 1 capsule by mouth daily. 11/17/15   Thermon Leyland, NP  oseltamivir (TAMIFLU) 75 MG capsule Take 1 capsule (75 mg total) by mouth every 12 (twelve) hours. 05/27/17   Lattie Haw, MD    Family History Family History  Problem Relation Age of Onset  . Heart disease Father   . Mental illness Maternal Grandmother     Social History Social History   Tobacco Use  . Smoking status: Never Smoker  . Smokeless tobacco: Never Used  Substance  Use Topics  . Alcohol use: No    Alcohol/week: 0.0 oz  . Drug use: No    Frequency: 4.0 times per week    Types: Methamphetamines    Comment: Pt has hx of meth dependence. Sober and in tx since 12/26/16     Allergies   Wellbutrin [bupropion]   Review of Systems Review of Systems  Musculoskeletal: Positive for stiffness.  Skin: Positive for color change.  All other systems reviewed and are negative.    Physical Exam Updated Vital Signs BP (!) 142/98 (BP Location: Left Arm)   Pulse 94   Temp 98.5 F (36.9 C) (Oral)   Resp 18    SpO2 99%   Physical Exam  Constitutional: He is oriented to person, place, and time. He appears well-developed and well-nourished.  HENT:  Head: Atraumatic.  Eyes: Conjunctivae are normal.  Neck: Neck supple.  Cardiovascular: Normal rate and regular rhythm.  Pulmonary/Chest: Effort normal and breath sounds normal.  Abdominal: Soft. Bowel sounds are normal.  Musculoskeletal: Normal range of motion. He exhibits tenderness.  Erythema and increased warmth noted to medial aspect of left foot and ankle (photo attached).  Neurological: He is alert and oriented to person, place, and time.  Skin: Skin is warm. There is erythema.  Psychiatric: He has a normal mood and affect.  Nursing note and vitals reviewed.      ED Treatments / Results  Labs (all labs ordered are listed, but only abnormal results are displayed) Labs Reviewed  I-STAT CHEM 8, ED - Abnormal; Notable for the following components:      Result Value   Chloride 97 (*)    All other components within normal limits    EKG None  Radiology No results found.   LE venous study negative for DVT.  Procedures Procedures (including critical care time)  Medications Ordered in ED Medications - No data to display   Initial Impression / Assessment and Plan / ED Course  I have reviewed the triage vital signs and the nursing notes.  Pertinent labs & imaging results that were available during my care of the patient were reviewed by me and considered in my medical decision making (see chart for details).     Patient presentation consistent with cellulitis. Afebrile. No tachycardia, hypotension or other symptoms suggestive of severe infection. Area has been demarcated and pt advised to follow up for wound check in 2-3 days, sooner for worsening systemic symptoms, new lymphangitis, or significant spread of erythema past line of demarcation. Will discharge with keflex. Return precautions discussed. Pt appears safe for discharge.    Final Clinical Impressions(s) / ED Diagnoses   Final diagnoses:  Cellulitis of foot    ED Discharge Orders        Ordered    cephALEXin (KEFLEX) 500 MG capsule  4 times daily     11/10/17 1649       Felicie Morn, NP 11/10/17 1842    Arby Barrette, MD 11/21/17 [id number]

## 2017-11-10 NOTE — ED Triage Notes (Signed)
Per pt, states he noticed his left lower leg was red, warm to touch and swollen-no trauma-has had cellulitis in the past-no SOB, no CP

## 2017-11-10 NOTE — ED Notes (Signed)
Per the Vascular Tech, pt is negative for DVT but does have some torsion in some ligaments in his legs.

## 2017-11-10 NOTE — Progress Notes (Signed)
Preliminary notes--Left lower extremity venous duplex study completed. Negative for DVT. Incidental finding:  A 2.8x1.4x1.3cm complex fluid collection, non-vascular structure noted at the left distal thigh above knee medial portion, which the area patient states the most pain.   Jimmy Davis (RDMS RVT) 11/10/17 2:03 PM

## 2017-12-09 ENCOUNTER — Encounter: Payer: Self-pay | Admitting: Internal Medicine

## 2017-12-09 ENCOUNTER — Ambulatory Visit (INDEPENDENT_AMBULATORY_CARE_PROVIDER_SITE_OTHER): Payer: Self-pay | Admitting: Internal Medicine

## 2017-12-09 VITALS — BP 124/89 | HR 96 | Temp 97.7°F | Ht 70.0 in | Wt 210.0 lb

## 2017-12-09 DIAGNOSIS — B20 Human immunodeficiency virus [HIV] disease: Secondary | ICD-10-CM

## 2017-12-09 DIAGNOSIS — Z23 Encounter for immunization: Secondary | ICD-10-CM

## 2017-12-09 DIAGNOSIS — F1590 Other stimulant use, unspecified, uncomplicated: Secondary | ICD-10-CM

## 2017-12-09 DIAGNOSIS — Z79899 Other long term (current) drug therapy: Secondary | ICD-10-CM

## 2017-12-09 NOTE — Patient Instructions (Signed)
  The following works for treating your constipation  Miralax, take as directed, but can increase if not working  Prune juice or warm apple juice or warm pear juice  magnesium citrate (liquid)  Smooth move tea  Bisacodyl suppository

## 2017-12-09 NOTE — Progress Notes (Signed)
prevnr

## 2017-12-09 NOTE — Progress Notes (Signed)
Rfv: follow up for hiv disease  Patient ID: Jimmy Davis, male   DOB: 1981-01-13, 37 y.o.   MRN: [id number]  HPI Jimmy Davis is a 37 yo M with hiv disease, hx of depression, and active meth user. Cd 4 count of 630/VL<20 in march 2019 on biktarvy. Went to the ED in may for cellulitis of foot that he took keflex which improved. He is still using including today- via smoking. He realizes that he needs to quit and is contemplating when to get to rehab. -closer this time than in previous conversation.  Outpatient Encounter Medications as of 12/09/2017  Medication Sig  . acetaminophen (TYLENOL) 500 MG tablet Take 1 tablet (500 mg total) by mouth every 6 (six) hours as needed for mild pain or moderate pain. (Patient not taking: Reported on 11/10/2017)  . aspirin 325 MG EC tablet Take 325 mg by mouth every 4 (four) hours as needed for pain.  . baclofen (LIORESAL) 20 MG tablet Take 1 tablet (20 mg total) by mouth 2 (two) times daily. (Patient not taking: Reported on 11/10/2017)  . bictegravir-emtricitabine-tenofovir AF (BIKTARVY) 50-200-25 MG TABS tablet Take 1 tablet by mouth daily.  . calcium carbonate (TUMS - DOSED IN MG ELEMENTAL CALCIUM) 500 MG chewable tablet Chew 2 tablets by mouth daily as needed for indigestion or heartburn.  . cephALEXin (KEFLEX) 500 MG capsule Take 1 capsule (500 mg total) by mouth 4 (four) times daily. (Patient not taking: Reported on 12/09/2017)  . cetirizine (ZYRTEC) 10 MG tablet Take 1 tablet (10 mg total) by mouth daily. (Patient taking differently: Take 10 mg by mouth daily as needed for allergies. )  . FLUoxetine (PROZAC) 40 MG capsule Take 1 capsule (40 mg total) by mouth daily.  Marland Kitchen. lamoTRIgine (LAMICTAL) 100 MG tablet Take 1 tablet (100 mg total) by mouth daily.  . Multiple Vitamin (MULTIVITAMIN) capsule Take 1 capsule by mouth daily.  Marland Kitchen. oseltamivir (TAMIFLU) 75 MG capsule Take 1 capsule (75 mg total) by mouth every 12 (twelve) hours. (Patient not taking: Reported on  11/10/2017)   No facility-administered encounter medications on file as of 12/09/2017.      Patient Active Problem List   Diagnosis Date Noted  . Obesity (BMI 30.0-34.9) 03/26/2017  . Obsessive-compulsive behavior 12/13/2016  . Anxiety 12/11/2016  . Methamphetamine use disorder, severe, dependence (HCC) 12/09/2016  . Major depressive disorder, recurrent episode, moderate (HCC) 11/17/2015  . 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 Depression 10/03/2014  . Human immunodeficiency virus (HIV) disease (HCC) 06/29/2013     Health Maintenance Due  Topic Date Due  . TETANUS/TDAP  07/02/1998     Review of Systems Review of Systems  Constitutional: Negative for fever, chills, diaphoresis, activity change, appetite change, fatigue and unexpected weight change.  HENT: Negative for congestion, sore throat, rhinorrhea, sneezing, trouble swallowing and sinus pressure.  Eyes: Negative for photophobia and visual disturbance.  Respiratory: Negative for cough, chest tightness, shortness of breath, wheezing and stridor.  Cardiovascular: Negative for chest pain, palpitations and leg swelling.  Gastrointestinal: Negative for nausea, vomiting, abdominal pain, diarrhea, constipation, blood in stool, abdominal distention and anal bleeding.  Genitourinary: Negative for dysuria, hematuria, flank pain and difficulty urinating.  Musculoskeletal: Negative for myalgias, back pain, joint swelling, arthralgias and gait problem.  Skin: Negative for color change, pallor, rash and wound.  Neurological: Negative for dizziness, tremors, weakness and light-headedness.  Hematological: Negative for adenopathy. Does not bruise/bleed easily.  Psychiatric/Behavioral: Negative for behavioral problems, confusion, sleep disturbance, dysphoric mood, decreased concentration  and agitation.    Physical Exam  BP 124/89   Pulse 96   Temp 97.7 F (36.5 C) (Oral)   Ht 5\' 10"  (1.778 m)   Wt 210 lb (95.3 kg)   BMI 30.13 kg/m    Physical Exam  Constitutional: He is oriented to person, place, and time. He appears well-developed and well-nourished. No distress.  HENT:  Mouth/Throat: Oropharynx is clear and moist. No oropharyngeal exudate.  Cardiovascular: Normal rate, regular rhythm and normal heart sounds. Exam reveals no gallop and no friction rub.  No murmur heard.  Pulmonary/Chest: Effort normal and breath sounds normal. No respiratory distress. He has no wheezes.  Abdominal: Soft. Bowel sounds are normal. He exhibits no distension. There is no tenderness.  Lymphadenopathy:  He has no cervical adenopathy.  Neurological: He is alert and oriented to person, place, and time.  Skin: Skin is warm and dry. No rash noted. No erythema.  Psychiatric: He has a normal mood and affect. His behavior is normal.     Lab Results  Component Value Date   CD4TCELL 27 (L) 09/08/2017   Lab Results  Component Value Date   CD4TABS 650 09/08/2017   CD4TABS 660 04/08/2017   CD4TABS 580 09/12/2016   Lab Results  Component Value Date   HIV1RNAQUANT <20 NOT DETECTED 09/08/2017   Lab Results  Component Value Date   HEPBSAB NEG 08/17/2013   Lab Results  Component Value Date   LABRPR NON-REACTIVE 09/08/2017    CBC Lab Results  Component Value Date   WBC 7.0 09/28/2017   RBC 4.72 09/28/2017   HGB 14.3 11/10/2017   HCT 42.0 11/10/2017   PLT 241 09/28/2017   MCV 88.3 09/28/2017   MCH 31.6 09/28/2017   MCHC 35.7 09/28/2017   RDW 12.6 09/28/2017   LYMPHSABS 3.0 09/28/2017   MONOABS 0.6 09/28/2017   EOSABS 0.2 09/28/2017    BMET Lab Results  Component Value Date   NA 140 11/10/2017   K 3.6 11/10/2017   CL 97 (L) 11/10/2017   CO2 25 09/28/2017   GLUCOSE 90 11/10/2017   BUN 12 11/10/2017   CREATININE 1.10 11/10/2017   CALCIUM 9.4 09/28/2017   GFRNONAA >60 09/28/2017   GFRAA >60 09/28/2017      Assessment and Plan hiv disease = well controlled. Continue on current regimen  Long term medication  management = cr is stable.   Current meth user = appears closer to going to rehab and quiting. He has had periods of recovery in the past  Health maintenance = - will give prevnar 13 today

## 2017-12-30 ENCOUNTER — Ambulatory Visit (INDEPENDENT_AMBULATORY_CARE_PROVIDER_SITE_OTHER): Payer: Self-pay | Admitting: Psychiatry

## 2017-12-30 ENCOUNTER — Encounter (HOSPITAL_COMMUNITY): Payer: Self-pay | Admitting: Psychiatry

## 2017-12-30 VITALS — BP 132/91 | HR 126 | Ht 70.0 in | Wt 214.0 lb

## 2017-12-30 DIAGNOSIS — F3342 Major depressive disorder, recurrent, in full remission: Secondary | ICD-10-CM

## 2017-12-30 MED ORDER — LAMOTRIGINE 100 MG PO TABS: 100.0000 mg | ORAL_TABLET | Freq: Every day | ORAL | 3 refills | Status: DC

## 2017-12-30 NOTE — Progress Notes (Signed)
Psychiatric Initial Adult Assessment   Patient Identification: Jimmy Davis MRN:  [id number] Date of Evaluation:  12/30/2017 Referral Source: hemical dependency IOP Chief Complaint:   Visit Diagnosis: No diagnosis found.  History of Present Illness:  Today the patient seems to be stable.Still looking for a job. He still is dependent upon his mother to pay his rent. The patient continues to use a stimulant really methylphenidate. Used almost a dozen times in the last 6 weeks since I've seen the patient admits that is a problem. Try to get back onto the financial assistance programs, back to our IOP program release with therapy with her therapist. He denies daily depression. He describes some mild anxiety. Most the time and situation anxiety is sleeping and eating well. Is no signs of psychosis. His HIV status is stable. Suspect he contracted HIV through the use of needles. Patient takes his medicine just as prescribed. He admitted to a psychiatric care as a great deal of problems discontinuing stimulant use. He says it does get euphoria.The patient says that the next week he will seeing some friends they will try to get him back into NA. Patient is interested in something to help his anxiety. Is taking BuSpar the past did not help. Today I shared with him that he needs to be recovering for which right any other mind altering medications. The possibility of using Neurontin as an not out of the question. The patient denies being suicidal or homicidal. Associated Signs/Symptoms: Depression Symptoms:  anxiety, (Hypo) Manic Symptoms:   Anxiety Symptoms:   Psychotic Symptoms:   PTSD Symptoms:   Past Psychiatric History: 3 psychiatric hospitalizations multiple psychiatric medications  Previous Psychotropic Medications: yes  Substance Abuse History in the last 12 months:  Yes.    Consequences of Substance Abuse: Negative  Past Medical History:  Past Medical History:  Diagnosis Date  .  Anxiety   . Depression   . Human immunodeficiency virus (HIV) disease (HCC)   . 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 Substance abuse [facility](HCC)     Past Surgical History:  Procedure Laterality Date  . WISDOM TOOTH EXTRACTION      Family Psychiatric History:   Family History:  Family History  Problem Relation Age of Onset  . Heart disease Father   . Mental illness Maternal Grandmother     Social History:   Social History   Socioeconomic History  . Marital status: Single    Spouse name: Not on file  . Number of children: Not on file  . Years of education: Not on file  . Highest education level: Not on file  Occupational History  . Occupation: Secretary/administratorAccounting    Employer: [employer]  Social Needs  . Financial resource strain: Not on file  . Food insecurity:    Worry: Not on file    Inability: Not on file  . Transportation needs:    Medical: Not on file    Non-medical: Not on file  Tobacco Use  . Smoking status: Never Smoker  . Smokeless tobacco: Never Used  Substance and Sexual Activity  . Alcohol use: No    Alcohol/week: 0.0 oz  . Drug use: Yes    Frequency: 4.0 times per week    Types: Methamphetamines    Comment: Pt has hx of meth dependence. Sober and in tx since 12/26/16  . Sexual activity: Yes    Partners: Male    Birth control/protection: None, Condom    Comment: declined condoms  Lifestyle  .  Physical activity:    Days per week: Not on file    Minutes per session: Not on file  . Stress: Not on file  Relationships  . Social connections:    Talks on phone: Not on file    Gets together: Not on file    Attends religious service: Not on file    Active member of club or organization: Not on file    Attends meetings of clubs or organizations: Not on file    Relationship status: Not on file  Other Topics Concern  . Not on file  Social History Narrative   Single, 5-6 caffeinated beverages daily previous but not current illicit drugs   Employed in  Audiological scientist business    Additional Social History:    Allergies:   Allergies  Allergen Reactions  . Wellbutrin [Bupropion] Other (See Comments)    Psychotic reaction    Metabolic Disorder Labs: Lab Results  Component Value Date   HGBA1C 5.2 12/07/2014   MPG 103 12/07/2014   No results found for: PROLACTIN Lab Results  Component Value Date   CHOL 214 (H) 09/12/2016   TRIG 139 09/12/2016   HDL 48 09/12/2016   CHOLHDL 4.5 09/12/2016   VLDL 28 09/12/2016   LDLCALC 138 (H) 09/12/2016   LDLCALC 107 09/19/2015     Current Medications: Current Outpatient Medications  Medication Sig Dispense Refill  . acetaminophen (TYLENOL) 500 MG tablet Take 1 tablet (500 mg total) by mouth every 6 (six) hours as needed for mild pain or moderate pain. 30 tablet 0  . aspirin 325 MG EC tablet Take 325 mg by mouth every 4 (four) hours as needed for pain.    . baclofen (LIORESAL) 20 MG tablet Take 1 tablet (20 mg total) by mouth 2 (two) times daily. 60 tablet 3  . bictegravir-emtricitabine-tenofovir AF (BIKTARVY) 50-200-25 MG TABS tablet Take 1 tablet by mouth daily. 30 tablet 11  . calcium carbonate (TUMS - DOSED IN MG ELEMENTAL CALCIUM) 500 MG chewable tablet Chew 2 tablets by mouth daily as needed for indigestion or heartburn.    . cephALEXin (KEFLEX) 500 MG capsule Take 1 capsule (500 mg total) by mouth 4 (four) times daily. 20 capsule 0  . cetirizine (ZYRTEC) 10 MG tablet Take 1 tablet (10 mg total) by mouth daily. (Patient taking differently: Take 10 mg by mouth daily as needed for allergies. ) 30 tablet 3  . FLUoxetine (PROZAC) 40 MG capsule Take 1 capsule (40 mg total) by mouth daily. 30 capsule 2  . lamoTRIgine (LAMICTAL) 100 MG tablet Take 1 tablet (100 mg total) by mouth daily. 30 tablet 3  . Multiple Vitamin (MULTIVITAMIN) capsule Take 1 capsule by mouth daily.    Marland Kitchen oseltamivir (TAMIFLU) 75 MG capsule Take 1 capsule (75 mg total) by mouth every 12 (twelve) hours. 10 capsule 0   No  current facility-administered medications for this visit.     Neurologic: Headache: No Seizure: No Paresthesias:No  Musculoskeletal: Strength & Muscle Tone: within normal limits Gait & Station: normal Patient leans: N/A  Psychiatric Specialty Exam: ROS  Blood pressure (!) 132/91, pulse (!) 126, height 5\' 10"  (1.778 m), weight 214 lb (97.1 kg), SpO2 100 %.Body mass index is 30.71 kg/m.  General Appearance: Casual  Eye Contact:  Good  Speech:  Clear and Coherent  Volume:  Normal  Mood:  Negative  Affect:  Appropriate  Thought Process:  Coherent  Orientation:  NA  Thought Content:  WDL  Suicidal Thoughts:  No  Homicidal Thoughts:  No  Memory:  NA  Judgement:  Good  Insight:  Good  Psychomotor Activity:  Normal  Concentration:    Recall:  Good  Fund of Knowledge:Good  Language: NA  Akathisia:  No  Handed:  Right  AIMS (if indicated):    Assets:  Desire for Improvement  ADL's:  Intact  Cognition: WNL  Sleep:      Treatment Plan Summary: At this time the patient continue taking Prozac 40 mg a continue Lamictal 100 mg.We encouraged him to enter into a substance abuse programwhether his this community at our hospital with his therapistor the community at a NA group. The patient does not appear distressed at all.He is willing to try to quit his drug use as he knows it is a problem. We'll continue his psychiatric medicines time being. We'll consider the possibility of Neurontin given that is made some movement regards to his substances. He does claim that his anxiety does get in the way at times. The side effects his ability to work where to go for a job. I'm not clear if that's improved. Gypsy Balsam, MD 6/25/20191:42 PM

## 2018-02-03 ENCOUNTER — Other Ambulatory Visit (HOSPITAL_COMMUNITY): Payer: Self-pay

## 2018-02-03 MED ORDER — FLUOXETINE HCL 40 MG PO CAPS: 40.0000 mg | ORAL_CAPSULE | Freq: Every day | ORAL | 0 refills | Status: DC

## 2018-02-07 ENCOUNTER — Encounter (HOSPITAL_COMMUNITY): Payer: Self-pay

## 2018-02-07 ENCOUNTER — Emergency Department (HOSPITAL_COMMUNITY): Payer: Medicaid Other

## 2018-02-07 ENCOUNTER — Emergency Department (HOSPITAL_COMMUNITY)
Admission: EM | Admit: 2018-02-07 | Discharge: 2018-02-07 | Disposition: A | Discharge status: Home / Self Care | Payer: Medicaid Other | Attending: Emergency Medicine | Admitting: Emergency Medicine

## 2018-02-07 ENCOUNTER — Other Ambulatory Visit: Payer: Self-pay

## 2018-02-07 DIAGNOSIS — R0981 Nasal congestion: Secondary | ICD-10-CM | POA: Insufficient documentation

## 2018-02-07 DIAGNOSIS — Z79899 Other long term (current) drug therapy: Secondary | ICD-10-CM | POA: Insufficient documentation

## 2018-02-07 DIAGNOSIS — R059 Cough, unspecified: Secondary | ICD-10-CM

## 2018-02-07 DIAGNOSIS — F1910 Other psychoactive substance abuse, uncomplicated: Secondary | ICD-10-CM | POA: Insufficient documentation

## 2018-02-07 DIAGNOSIS — B20 Human immunodeficiency virus [HIV] disease: Secondary | ICD-10-CM | POA: Insufficient documentation

## 2018-02-07 DIAGNOSIS — R05 Cough: Secondary | ICD-10-CM | POA: Insufficient documentation

## 2018-02-07 MED ORDER — BENZONATATE 100 MG PO CAPS: 100.0000 mg | ORAL_CAPSULE | Freq: Three times a day (TID) | ORAL | 0 refills | Status: DC

## 2018-02-07 NOTE — ED Triage Notes (Signed)
He c/o uri sx x 2 days. He is in no distress.

## 2018-02-07 NOTE — ED Provider Notes (Addendum)
[facility]-EMERGENCY DEPT Provider Note   CSN: [id number] Arrival date & time: 02/07/18  [id number]     History   Chief Complaint Chief Complaint  Patient presents with  . URI    HPI Jimmy Davis is a 37 y.o. male.  Patient is a 37 year old male who presents with cough and runny nose.  He has a history of anxiety, depression, HIV and substance abuse.  He states that he uses methamphetamines.  He states that last night he did snort crystal meth.  Since then he has had some runny nose and stuffy nose.  He also has had a cough which is nonproductive.  He denies any shortness of breath other than during bad coughing spells.  No vomiting.  No fevers.  He does feel like his anxiety has increased.  He is having some brief shaking of his extremities but he is fully awake during these episodes.     Past Medical History:  Diagnosis Date  . Anxiety   . Depression   . Human immunodeficiency virus (HIV) disease (HCC)   . 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 Substance abuse [facility](HCC)     Patient Active Problem List   Diagnosis Date Noted  . Obesity (BMI 30.0-34.9) 03/26/2017  . Obsessive-compulsive behavior 12/13/2016  . Anxiety 12/11/2016  . Methamphetamine use disorder, severe, dependence (HCC) 12/09/2016  . Major depressive disorder, recurrent episode, moderate (HCC) 11/17/2015  . 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 Depression 10/03/2014  . Human immunodeficiency virus (HIV) disease (HCC) 06/29/2013    Past Surgical History:  Procedure Laterality Date  . WISDOM TOOTH EXTRACTION          Home Medications    Prior to Admission medications   Medication Sig Start Date End Date Taking? Authorizing Provider  aspirin 325 MG EC tablet Take 325 mg by mouth every 4 (four) hours as needed for pain.   Yes [provider]  bictegravir-emtricitabine-tenofovir AF (BIKTARVY) 50-200-25 MG TABS tablet Take 1 tablet by mouth daily. 09/08/17  Yes Judyann MunsonSnider, Cynthia, MD    FLUoxetine (PROZAC) 40 MG capsule Take 1 capsule (40 mg total) by mouth daily. 02/03/18  Yes Plovsky, Earvin HansenGerald, MD  L-Lysine 1000 MG TABS Take 1,000 mg by mouth daily.   Yes [provider]  lamoTRIgine (LAMICTAL) 100 MG tablet Take 1 tablet (100 mg total) by mouth daily. 12/30/17 01/29/19 Yes Plovsky, Earvin HansenGerald, MD  oxymetazoline (AFRIN) 0.05 % nasal spray Place 1 spray into both nostrils 2 (two) times daily as needed for congestion.   Yes [provider]  acetaminophen (TYLENOL) 500 MG tablet Take 1 tablet (500 mg total) by mouth every 6 (six) hours as needed for mild pain or moderate pain. Patient not taking: Reported on 02/07/2018 10/23/16   Fayrene Helperran, Bowie, PA-C  baclofen (LIORESAL) 20 MG tablet Take 1 tablet (20 mg total) by mouth 2 (two) times daily. Patient not taking: Reported on 02/07/2018 06/25/17 06/25/18  Archer AsaPlovsky, Gerald, MD  benzonatate (TESSALON) 100 MG capsule Take 1 capsule (100 mg total) by mouth every 8 (eight) hours. 02/07/18   Rolan BuccoBelfi, Kyrese Gartman, MD  cephALEXin (KEFLEX) 500 MG capsule Take 1 capsule (500 mg total) by mouth 4 (four) times daily. Patient not taking: Reported on 02/07/2018 11/10/17   Felicie MornSmith, David, NP  cetirizine (ZYRTEC) 10 MG tablet Take 1 tablet (10 mg total) by mouth daily. Patient not taking: Reported on 02/07/2018 06/28/15   Judyann MunsonSnider, Cynthia, MD  Multiple Vitamin (MULTIVITAMIN) capsule Take 1 capsule by mouth daily. Patient  not taking: Reported on 02/07/2018 11/17/15   Thermon Leyland, NP  oseltamivir (TAMIFLU) 75 MG capsule Take 1 capsule (75 mg total) by mouth every 12 (twelve) hours. Patient not taking: Reported on 02/07/2018 05/27/17   Lattie Haw, MD    Family History Family History  Problem Relation Age of Onset  . Heart disease Father   . Mental illness Maternal Grandmother     Social History Social History   Tobacco Use  . Smoking status: Never Smoker  . Smokeless tobacco: Never Used  Substance Use Topics  . Alcohol use: No    Alcohol/week: 0.0  oz  . Drug use: Yes    Frequency: 4.0 times per week    Types: Methamphetamines    Comment: Pt has hx of meth dependence. Sober and in tx since 12/26/16     Allergies   Wellbutrin [bupropion]   Review of Systems Review of Systems  Constitutional: Negative for chills, diaphoresis, fatigue and fever.  HENT: Positive for congestion and rhinorrhea. Negative for sneezing.   Eyes: Negative.   Respiratory: Positive for cough. Negative for chest tightness and shortness of breath.   Cardiovascular: Negative for chest pain and leg swelling.  Gastrointestinal: Negative for abdominal pain, blood in stool, diarrhea, nausea and vomiting.  Genitourinary: Negative for difficulty urinating, flank pain, frequency and hematuria.  Musculoskeletal: Negative for arthralgias and back pain.  Skin: Negative for rash.  Neurological: Negative for dizziness, speech difficulty, weakness, numbness and headaches.  Psychiatric/Behavioral: The patient is nervous/anxious.      Physical Exam Updated Vital Signs BP (!) 139/100 (BP Location: Left Arm)   Pulse 89   Temp 98.4 F (36.9 C) (Oral)   Resp 18   SpO2 99%   Physical Exam  Constitutional: He is oriented to person, place, and time. He appears well-developed and well-nourished.  HENT:  Head: Normocephalic and atraumatic.  Mouth/Throat: Oropharynx is clear and moist. No oropharyngeal exudate.  Eyes: Pupils are equal, round, and reactive to light. Conjunctivae are normal.  Neck: Normal range of motion. Neck supple.  Cardiovascular: Normal rate, regular rhythm and normal heart sounds.  Pulmonary/Chest: Effort normal and breath sounds normal. No respiratory distress. He has no wheezes. He has no rales. He exhibits no tenderness.  Abdominal: Soft. Bowel sounds are normal. There is no tenderness. There is no rebound and no guarding.  Musculoskeletal: Normal range of motion. He exhibits no edema.  Lymphadenopathy:    He has no cervical adenopathy.   Neurological: He is alert and oriented to person, place, and time.  Skin: Skin is warm and dry. No rash noted.  Psychiatric: He has a normal mood and affect.     ED Treatments / Results  Labs (all labs ordered are listed, but only abnormal results are displayed) Labs Reviewed - No data to display  EKG None  Radiology Dg Chest 2 View  Result Date: 02/07/2018 CLINICAL DATA:  Cough and shortness of breath for several hours EXAM: CHEST - 2 VIEW COMPARISON:  06/25/2015 FINDINGS: The heart size and mediastinal contours are within normal limits. Both lungs are clear. The visualized skeletal structures are unremarkable. IMPRESSION: No active cardiopulmonary disease. Electronically Signed   By: Alcide Clever M.D.   On: 02/07/2018 08:09    Procedures Procedures (including critical care time)  Medications Ordered in ED Medications - No data to display   Initial Impression / Assessment and Plan / ED Course  I have reviewed the triage vital signs and the nursing notes.  Pertinent labs & imaging results that were available during my care of the patient were reviewed by me and considered in my medical decision making (see chart for details).     Patient is a 37 year old male who presents with rhinorrhea and nonproductive cough.  His lungs are clear without wheezing.  He has normal oxygen saturations.  He does have coughing spells when he takes a deep breath.  He has been snorting crystal meth but he says it was in the powder form and there was no concerns for inhalation/aspiration of a solid material.  I feel her symptoms are related to his crystal meth use and likely some bronchospasm related to that.  He is in no distress now.  He only has a coughing spells when he takes a deep breath.  He has no wheezing.  He was discharged home in good condition.  I will give him prescription for [facility] as he may have a little bit of a URI.  Return precautions were given.  Patient states that he  does have resources for possible treatment of his substance abuse disorder.  He does not want any peers support consult or any further resources given regarding this.  Final Clinical Impressions(s) / ED Diagnoses   Final diagnoses:  Cough    ED Discharge Orders        Ordered    benzonatate (TESSALON) 100 MG capsule  Every 8 hours     02/07/18 0851       Rolan Bucco, MD 02/07/18 1610    Rolan Bucco, MD 02/07/18 [phone]

## 2018-02-11 ENCOUNTER — Encounter: Payer: Self-pay | Admitting: Internal Medicine

## 2018-03-02 ENCOUNTER — Telehealth: Payer: Self-pay | Admitting: Behavioral Health

## 2018-03-02 NOTE — Telephone Encounter (Signed)
Patient called stating his left leg is swelling. Advised patient to go to urgent care.  He states he does not have insurance and unable to afford. Patient states he had cellulitis in May and was on antibiotics.  Patient requested an appointment to see Dr. Drue SecondSnider.  [facility]. Angeline SlimAshley Hill RN

## 2018-03-03 ENCOUNTER — Ambulatory Visit (INDEPENDENT_AMBULATORY_CARE_PROVIDER_SITE_OTHER): Payer: Self-pay | Admitting: Infectious Diseases

## 2018-03-03 ENCOUNTER — Encounter: Payer: Self-pay | Admitting: Infectious Diseases

## 2018-03-03 DIAGNOSIS — F1520 Other stimulant dependence, uncomplicated: Secondary | ICD-10-CM

## 2018-03-03 DIAGNOSIS — M79605 Pain in left leg: Secondary | ICD-10-CM

## 2018-03-03 NOTE — Progress Notes (Signed)
Name: Jimmy Davis  DOB: 05-12-1981 MRN: [id number] PCP: Patient, No Pcp Per    Patient Active Problem List   Diagnosis Date Noted  . Left leg pain 03/03/2018  . Obesity (BMI 30.0-34.9) 03/26/2017  . Obsessive-compulsive behavior 12/13/2016  . Anxiety 12/11/2016  . Methamphetamine use disorder, severe, dependence (HCC) 12/09/2016  . Major depressive disorder, recurrent episode, moderate (HCC) 11/17/2015  . 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 Depression 10/03/2014  . Human immunodeficiency virus (HIV) disease (HCC) 06/29/2013     Subjective:    Chief Complaint  Patient presents with  . Knee Pain    left knee, new over the last week. Worried about cellulitis.     Jimmy Davis is a 37 y.o. male with well controlled HIV disease here today for acute visit related to leg pain. He is concerned there may be cellulitis forming as he had a previous episode in March of this year. He has a new job at [employer] and has been on his feet more often lately due to this. Has noticed some pain in the left knee that occasionally causes a very mild limp but no difficulty with walking. Does have some "shock like pain" up the calf into knee that he experiences when he steps down sometimes. Some swelling present at the end of the day but no warmth, erythema present and no pain with palpation over skin around knee or to calf. Denies fevers. Knee does not seem to be swollen to him today. He has done some tylenol intermittently that has helped a little bit. He was previously intermittently and inconsistently using heat for pain control as well which did not help. Denies fevers, chills or night sweats. No popping, clicking or grinding to this knee. He admits he is very nervous and worried about repeating experience again with cellulitis. He uses methamphetamines and openly discusses this with me today. Pre-contemplative about getting clean as this is making his anxiety worse. He is asking a lot of questions  with regard to his CK level and if this needs to be checked today with muscle pain.   Review of Systems  Constitutional: Negative for chills, fever and malaise/fatigue.  HENT: Negative for tinnitus.   Eyes: Negative for blurred vision and photophobia.  Respiratory: Negative for cough and sputum production.   Cardiovascular: Negative for chest pain, claudication and leg swelling.  Gastrointestinal: Negative for diarrhea, nausea and vomiting.  Genitourinary: Negative for dysuria and hematuria.  Musculoskeletal: Positive for joint pain (left knee, left calf ).  Skin: Negative for rash.  Neurological: Negative for headaches.  Psychiatric/Behavioral: Positive for substance abuse.    Past Medical History:  Diagnosis Date  . Anxiety   . Depression   . Human immunodeficiency virus (HIV) disease (HCC)   . 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 Substance abuse [facility](HCC)     Outpatient Medications Prior to Visit  Medication Sig Dispense Refill  . acetaminophen (TYLENOL) 500 MG tablet Take 1 tablet (500 mg total) by mouth every 6 (six) hours as needed for mild pain or moderate pain. 30 tablet 0  . aspirin 325 MG EC tablet Take 325 mg by mouth every 4 (four) hours as needed for pain.    . bictegravir-emtricitabine-tenofovir AF (BIKTARVY) 50-200-25 MG TABS tablet Take 1 tablet by mouth daily. 30 tablet 11  . cephALEXin (KEFLEX) 500 MG capsule Take 1 capsule (500 mg total) by mouth 4 (four) times daily. 20 capsule 0  . FLUoxetine (PROZAC) 40 MG capsule Take 1 capsule (  40 mg total) by mouth daily. 30 capsule 0  . lamoTRIgine (LAMICTAL) 100 MG tablet Take 1 tablet (100 mg total) by mouth daily. 30 tablet 3  . baclofen (LIORESAL) 20 MG tablet Take 1 tablet (20 mg total) by mouth 2 (two) times daily. (Patient not taking: Reported on 03/03/2018) 60 tablet 3  . benzonatate (TESSALON) 100 MG capsule Take 1 capsule (100 mg total) by mouth every 8 (eight) hours. (Patient not taking: Reported on 03/03/2018) 21  capsule 0  . cetirizine (ZYRTEC) 10 MG tablet Take 1 tablet (10 mg total) by mouth daily. (Patient not taking: Reported on 03/03/2018) 30 tablet 3  . L-Lysine 1000 MG TABS Take 1,000 mg by mouth daily.    . Multiple Vitamin (MULTIVITAMIN) capsule Take 1 capsule by mouth daily. (Patient not taking: Reported on 03/03/2018)    . oseltamivir (TAMIFLU) 75 MG capsule Take 1 capsule (75 mg total) by mouth every 12 (twelve) hours. (Patient not taking: Reported on 03/03/2018) 10 capsule 0  . oxymetazoline (AFRIN) 0.05 % nasal spray Place 1 spray into both nostrils 2 (two) times daily as needed for congestion.     No facility-administered medications prior to visit.      Allergies  Allergen Reactions  . Wellbutrin [Bupropion] Other (See Comments)    Psychotic reaction    Social History   Tobacco Use  . Smoking status: Never Smoker  . Smokeless tobacco: Never Used  Substance Use Topics  . Alcohol use: No    Alcohol/week: 0.0 standard drinks  . Drug use: Yes    Frequency: 4.0 times per week    Types: Methamphetamines    Comment: Pt has hx of meth dependence. Sober and in tx since 12/26/16    Family History  Problem Relation Age of Onset  . Heart disease Father   . Mental illness Maternal Grandmother     Social History   Substance and Sexual Activity  Sexual Activity Yes  . Partners: Male  . Birth control/protection: None, Condom   Comment: declined condoms     Objective:   Vitals:   03/03/18 0919  BP: (!) 144/91  Pulse: 81  Temp: 98.3 F (36.8 C)  Weight: 207 lb 1.9 oz (93.9 kg)   Body mass index is 29.72 kg/m.  Physical Exam  Constitutional: He is oriented to person, place, and time. He appears well-developed and well-nourished.  Seated comfortably in chair. Pleasant and appears well today.   Cardiovascular: Normal rate and regular rhythm.  Pulmonary/Chest: Effort normal.  Musculoskeletal:       Left knee: He exhibits no effusion.  Neurological: He is alert and  oriented to person, place, and time.  Skin:  Skin to posterior left calf normal color, temperature and texture without swelling present. No tenderness with palpating skin.   Vitals reviewed. Musculoskeletal Exam:  Right Knee Exam  Right knee exam is normal.  Muscle Strength  The patient has normal right knee strength.  Range of Motion  The patient has normal right knee ROM.   Left Knee Exam   Muscle Strength  The patient has normal left knee strength.  Tenderness  The patient is experiencing tenderness in the patella, medial joint line and lateral joint line.  Range of Motion  Extension: normal  Flexion: normal   Tests  Varus: negative Valgus: negative Drawer:  Anterior - negative (mild/mod pain with maneuver )     Posterior - negative Pivot shift: negative  Other  Erythema: absent Swelling: none Effusion: no  effusion present     Lab Results Lab Results  Component Value Date   WBC 7.0 09/28/2017   HGB 14.3 11/10/2017   HCT 42.0 11/10/2017   MCV 88.3 09/28/2017   PLT 241 09/28/2017    Lab Results  Component Value Date   CREATININE 1.10 11/10/2017   BUN 12 11/10/2017   NA 140 11/10/2017   K 3.6 11/10/2017   CL 97 (L) 11/10/2017   CO2 25 09/28/2017    Lab Results  Component Value Date   ALT 23 09/28/2017   AST 21 09/28/2017   ALKPHOS 93 09/28/2017   BILITOT 0.4 09/28/2017    Lab Results  Component Value Date   CHOL 214 (H) 09/12/2016   HDL 48 09/12/2016   LDLCALC 138 (H) 09/12/2016   TRIG 139 09/12/2016   CHOLHDL 4.5 09/12/2016   HIV 1 RNA Quant (copies/mL)  Date Value  09/08/2017 <20 NOT DETECTED  04/08/2017 <20 DETECTED (A)  09/12/2016 <20 NOT DETECTED   CD4 T Cell Abs (/uL)  Date Value  09/08/2017 650  04/08/2017 660  09/12/2016 580     Assessment & Plan:   Problem List Items Addressed This Visit      Other   Methamphetamine use disorder, severe, dependence (HCC)    Pre-contemplative about quitting and seeking help for  addiction. His speech is very pressured today and interrupts frequently, but very pleasant and appreciative today. I think his anxiety is significantly worsened by this habit and I asked him to consider quitting for good.       Left leg pain    Skin exam is benign and no concern for cellulitis today. No antibiotics indicated.  Doubt elevated CK as he has isolated leg pain and not diffuse muscle aches; also denies tea-colored/dark urine. Reassured him today.  He had some tenderness with anterior drawer test but no laxity. Knee is without erythema, swelling, effusion but I do suspect he may have some underlying inflammation related to increased activity recently. Recommended conservative measures with RICE and 5 days of consistent anti-inflammatories over the counter (Aleve vs Ibuprofen). No indication for imaging today.         No orders of the defined types were placed in this encounter.  He will follow up with Dr. Drue Second as currently scheduled.   Rexene Alberts, MSN, NP-C [facility]  [email or web address] Pager: [phone] Office: [phone]  03/03/18  8:53 PMd

## 2018-03-03 NOTE — Assessment & Plan Note (Addendum)
Pre-contemplative about quitting and seeking help for addiction. His speech is very pressured today and interrupts frequently, but very pleasant and appreciative today. I think his anxiety is significantly worsened by this habit and I asked him to consider quitting for good.

## 2018-03-03 NOTE — Assessment & Plan Note (Signed)
Skin exam is benign and no concern for cellulitis today. No antibiotics indicated.  Doubt elevated CK as he has isolated leg pain and not diffuse muscle aches; also denies tea-colored/dark urine. Reassured him today.  He had some tenderness with anterior drawer test but no laxity. Knee is without erythema, swelling, effusion but I do suspect he may have some underlying inflammation related to increased activity recently. Recommended conservative measures with RICE and 5 days of consistent anti-inflammatories over the counter (Aleve vs Ibuprofen). No indication for imaging today.

## 2018-03-03 NOTE — Patient Instructions (Addendum)
I don't think you have cellulitis.   I think you may have some inflammation in your knee related to your new job that you need to get your body used to.   Would recommend doing for the pain/inflammation:   Ice 20 minutes to the knee 2-3 times a day --> make sure you include one session before bed  Ibuprofen 3-4 tablets three times a day (6-8 hours apart) for 5 days to decrease inflammation   Tylenol 2 extra strength three times a day as needed for pain/stiffness   Please call back if these do not help - may need to consider imaging your knee if pain worsens.   Knee Pain, Adult Many things can cause knee pain. The pain often goes away on its own with time and rest. If the pain does not go away, tests may be done to find out what is causing the pain. Follow these instructions at home: Activity  Rest your knee.  Do not do things that cause pain.  Avoid activities where both feet leave the ground at the same time (high-impact activities). Examples are running, jumping rope, and doing jumping jacks. General instructions  Take medicines only as told by your doctor.  Raise (elevate) your knee when you are resting. Make sure your knee is higher than your heart.  Sleep with a pillow under your knee.  If told, put ice on the knee: ? Put ice in a plastic bag. ? Place a towel between your skin and the bag. ? Leave the ice on for 20 minutes, 2-3 times a day.  Ask your doctor if you should wear an elastic knee support.  Lose weight if you are overweight. Being overweight can make your knee hurt more.  Do not use any tobacco products. These include cigarettes, chewing tobacco, or electronic cigarettes. If you need help quitting, ask your doctor. Smoking may slow down healing. Contact a doctor if:  The pain does not stop.  The pain changes or gets worse.  You have a fever along with knee pain.  Your knee gives out or locks up.  Your knee swells, and becomes worse. Get help right  away if:  Your knee feels warm.  You cannot move your knee.  You have very bad knee pain.  You have chest pain.  You have trouble breathing. Summary  Many things can cause knee pain. The pain often goes away on its own with time and rest.  Avoid activities that put stress on your knee. These include running and jumping rope.  Get help right away if you cannot move your knee, or if your knee feels warm, or if you have trouble breathing. This information is not intended to replace advice given to you by your health care provider. Make sure you discuss any questions you have with your health care provider. Document Released: 09/20/2008 Document Revised: 06/18/2016 Document Reviewed: 06/18/2016 Elsevier Interactive Patient Education  2017 [employer]

## 2018-03-11 ENCOUNTER — Other Ambulatory Visit (HOSPITAL_COMMUNITY)
Admission: RE | Admit: 2018-03-11 | Discharge: 2018-03-11 | Disposition: A | Discharge status: Home / Self Care | Payer: Medicaid Other | Source: Ambulatory Visit | Attending: Internal Medicine | Admitting: Internal Medicine

## 2018-03-11 ENCOUNTER — Ambulatory Visit (INDEPENDENT_AMBULATORY_CARE_PROVIDER_SITE_OTHER): Payer: Self-pay | Admitting: Internal Medicine

## 2018-03-11 VITALS — Wt 201.0 lb

## 2018-03-11 DIAGNOSIS — B20 Human immunodeficiency virus [HIV] disease: Secondary | ICD-10-CM | POA: Insufficient documentation

## 2018-03-11 DIAGNOSIS — F1520 Other stimulant dependence, uncomplicated: Secondary | ICD-10-CM

## 2018-03-11 DIAGNOSIS — Z23 Encounter for immunization: Secondary | ICD-10-CM

## 2018-03-11 NOTE — Progress Notes (Signed)
RFV: follow up for hiv disease  Patient ID: Jimmy Davis, male   DOB: 12/27/1980, 37 y.o.   MRN: [id number]  HPI  Jimmy Davis is a 37yo M with hiv disease, meth dependence. And hx of cellulitis. He was recently seen last week urgently concerning for recurrent cellulitis. It was felt to benign. New job of standing on his feet  No unprotected sex  Feeling tired of using meth and working on stopping   Outpatient Encounter Medications as of 03/11/2018  Medication Sig  . acetaminophen (TYLENOL) 500 MG tablet Take 1 tablet (500 mg total) by mouth every 6 (six) hours as needed for mild pain or moderate pain.  Marland Kitchen aspirin 325 MG EC tablet Take 325 mg by mouth every 4 (four) hours as needed for pain.  . baclofen (LIORESAL) 20 MG tablet Take 1 tablet (20 mg total) by mouth 2 (two) times daily. (Patient not taking: Reported on 03/03/2018)  . benzonatate (TESSALON) 100 MG capsule Take 1 capsule (100 mg total) by mouth every 8 (eight) hours. (Patient not taking: Reported on 03/03/2018)  . bictegravir-emtricitabine-tenofovir AF (BIKTARVY) 50-200-25 MG TABS tablet Take 1 tablet by mouth daily.  . cephALEXin (KEFLEX) 500 MG capsule Take 1 capsule (500 mg total) by mouth 4 (four) times daily.  . cetirizine (ZYRTEC) 10 MG tablet Take 1 tablet (10 mg total) by mouth daily. (Patient not taking: Reported on 03/03/2018)  . FLUoxetine (PROZAC) 40 MG capsule Take 1 capsule (40 mg total) by mouth daily.  Marland Kitchen L-Lysine 1000 MG TABS Take 1,000 mg by mouth daily.  Marland Kitchen lamoTRIgine (LAMICTAL) 100 MG tablet Take 1 tablet (100 mg total) by mouth daily.  . Multiple Vitamin (MULTIVITAMIN) capsule Take 1 capsule by mouth daily. (Patient not taking: Reported on 03/03/2018)  . oseltamivir (TAMIFLU) 75 MG capsule Take 1 capsule (75 mg total) by mouth every 12 (twelve) hours. (Patient not taking: Reported on 03/03/2018)  . oxymetazoline (AFRIN) 0.05 % nasal spray Place 1 spray into both nostrils 2 (two) times daily as needed for  congestion.   No facility-administered encounter medications on file as of 03/11/2018.      Patient Active Problem List   Diagnosis Date Noted  . Left leg pain 03/03/2018  . Obesity (BMI 30.0-34.9) 03/26/2017  . Obsessive-compulsive behavior 12/13/2016  . Anxiety 12/11/2016  . Methamphetamine use disorder, severe, dependence (HCC) 12/09/2016  . Major depressive disorder, recurrent episode, moderate (HCC) 11/17/2015  . 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 Depression 10/03/2014  . Human immunodeficiency virus (HIV) disease (HCC) 06/29/2013     Health Maintenance Due  Topic Date Due  . TETANUS/TDAP  07/02/1998  . INFLUENZA VACCINE  02/05/2018     Review of Systems Review of Systems  Constitutional: Negative for fever, chills, diaphoresis, activity change, appetite change, fatigue and unexpected weight change.  HENT: Negative for congestion, sore throat, rhinorrhea, sneezing, trouble swallowing and sinus pressure.  Eyes: Negative for photophobia and visual disturbance.  Respiratory: Negative for cough, chest tightness, shortness of breath, wheezing and stridor.  Cardiovascular: Negative for chest pain, palpitations and leg swelling.  Gastrointestinal: Negative for nausea, vomiting, abdominal pain, diarrhea, constipation, blood in stool, abdominal distention and anal bleeding.  Genitourinary: Negative for dysuria, hematuria, flank pain and difficulty urinating.  Musculoskeletal: occ leg swellig from standpoint on his feet. Negative for myalgias, back pain, joint swelling, arthralgias and gait problem.  Skin: Negative for color change, pallor, rash and wound.  Neurological: Negative for dizziness, tremors, weakness and light-headedness.  Hematological: Negative for  adenopathy. Does not bruise/bleed easily.  Psychiatric/Behavioral: Negative for behavioral problems, confusion, sleep disturbance, dysphoric mood, decreased concentration and agitation.    Physical Exam   Wt 201 lb (91.2  kg)   BMI 28.84 kg/m  Physical Exam  Constitutional: He is oriented to person, place, and time. He appears well-developed and well-nourished. No distress.  HENT:  Mouth/Throat: Oropharynx is clear and moist. No oropharyngeal exudate.  Cardiovascular: Normal rate, regular rhythm and normal heart sounds. Exam reveals no gallop and no friction rub.  No murmur heard.  Pulmonary/Chest: Effort normal and breath sounds normal. No respiratory distress. He has no wheezes.  Abdominal: Soft. Bowel sounds are normal. He exhibits no distension. There is no tenderness.  Lymphadenopathy:  He has no cervical adenopathy.  Neurological: He is alert and oriented to person, place, and time.  Skin: Skin is warm and dry. No rash noted. No erythema.  Psychiatric: He has a normal mood and affect. His behavior is normal.     Lab Results  Component Value Date   CD4TCELL 27 (L) 09/08/2017   Lab Results  Component Value Date   CD4TABS 650 09/08/2017   CD4TABS 660 04/08/2017   CD4TABS 580 09/12/2016   Lab Results  Component Value Date   HIV1RNAQUANT <20 NOT DETECTED 09/08/2017   Lab Results  Component Value Date   HEPBSAB NEG 08/17/2013   Lab Results  Component Value Date   LABRPR NON-REACTIVE 09/08/2017    CBC Lab Results  Component Value Date   WBC 7.0 09/28/2017   RBC 4.72 09/28/2017   HGB 14.3 11/10/2017   HCT 42.0 11/10/2017   PLT 241 09/28/2017   MCV 88.3 09/28/2017   MCH 31.6 09/28/2017   MCHC 35.7 09/28/2017   RDW 12.6 09/28/2017   LYMPHSABS 3.0 09/28/2017   MONOABS 0.6 09/28/2017   EOSABS 0.2 09/28/2017    BMET Lab Results  Component Value Date   NA 140 11/10/2017   K 3.6 11/10/2017   CL 97 (L) 11/10/2017   CO2 25 09/28/2017   GLUCOSE 90 11/10/2017   BUN 12 11/10/2017   CREATININE 1.10 11/10/2017   CALCIUM 9.4 09/28/2017   GFRNONAA >60 09/28/2017   GFRAA >60 09/28/2017      Assessment and Plan   Health maintenance =Getting flu shot  hiv disease = Will do  labs today as well  Meth use = spent time discussing means to decrease intake and possibly re-engagement with counseling  rtc in 4 mo

## 2018-03-12 LAB — URINE CYTOLOGY ANCILLARY ONLY
Chlamydia: NEGATIVE
Neisseria Gonorrhea: NEGATIVE

## 2018-03-12 LAB — T-HELPER CELL (CD4) - (RCID CLINIC ONLY)
CD4 % Helper T Cell: 27 % — ABNORMAL LOW (ref 33–55)
CD4 T CELL ABS: 580 /uL (ref 400–2700)

## 2018-03-13 LAB — RPR: RPR Ser Ql: NONREACTIVE

## 2018-03-13 LAB — COMPLETE METABOLIC PANEL WITH GFR
AG Ratio: 2 (calc) (ref 1.0–2.5)
ALT: 24 U/L (ref 9–46)
AST: 16 U/L (ref 10–40)
Albumin: 5 g/dL (ref 3.6–5.1)
Alkaline phosphatase (APISO): 94 U/L (ref 40–115)
BUN: 10 mg/dL (ref 7–25)
CALCIUM: 9.9 mg/dL (ref 8.6–10.3)
CHLORIDE: 101 mmol/L (ref 98–110)
CO2: 28 mmol/L (ref 20–32)
CREATININE: 1.06 mg/dL (ref 0.60–1.35)
GFR, EST AFRICAN AMERICAN: 103 mL/min/{1.73_m2} (ref >=60)
GFR, EST NON AFRICAN AMERICAN: 89 mL/min/{1.73_m2} (ref >=60)
Globulin: 2.5 g/dL (calc) (ref 1.9–3.7)
Glucose, Bld: 76 mg/dL (ref 65–99)
Potassium: 4.2 mmol/L (ref 3.5–5.3)
Sodium: 138 mmol/L (ref 135–146)
Total Bilirubin: 0.4 mg/dL (ref 0.2–1.2)
Total Protein: 7.5 g/dL (ref 6.1–8.1)

## 2018-03-13 LAB — CBC WITH DIFFERENTIAL/PLATELET
Basophils Absolute: 20 cells/uL (ref 0–200)
Basophils Relative: 0.3 %
Eosinophils Absolute: 211 cells/uL (ref 15–500)
Eosinophils Relative: 3.1 %
HEMATOCRIT: 46 % (ref 38.5–50.0)
Hemoglobin: 15.7 g/dL (ref 13.2–17.1)
LYMPHS ABS: 2176 {cells}/uL (ref 850–3900)
MCH: 31 pg (ref 27.0–33.0)
MCHC: 34.1 g/dL (ref 32.0–36.0)
MCV: 90.7 fL (ref 80.0–100.0)
MPV: 9.2 fL (ref 7.5–12.5)
Monocytes Relative: 6.6 %
Neutro Abs: 3944 cells/uL (ref 1500–7800)
Neutrophils Relative %: 58 %
PLATELETS: 315 10*3/uL (ref 140–400)
RBC: 5.07 10*6/uL (ref 4.20–5.80)
RDW: 13 % (ref 11.0–15.0)
TOTAL LYMPHOCYTE: 32 %
WBC: 6.8 10*3/uL (ref 3.8–10.8)
WBCMIX: 449 {cells}/uL (ref 200–950)

## 2018-03-13 LAB — HIV-1 RNA QUANT-NO REFLEX-BLD
HIV 1 RNA Quant: <20 copies/mL
HIV-1 RNA Quant, Log: <1.3 Log copies/mL

## 2018-03-17 ENCOUNTER — Encounter: Payer: Self-pay | Admitting: Behavioral Health

## 2018-03-31 ENCOUNTER — Other Ambulatory Visit: Payer: Self-pay | Admitting: Internal Medicine

## 2018-04-10 ENCOUNTER — Ambulatory Visit (INDEPENDENT_AMBULATORY_CARE_PROVIDER_SITE_OTHER): Payer: Self-pay | Admitting: Psychiatry

## 2018-04-10 ENCOUNTER — Encounter (HOSPITAL_COMMUNITY): Payer: Self-pay | Admitting: Psychiatry

## 2018-04-10 ENCOUNTER — Encounter

## 2018-04-10 VITALS — BP 130/82 | HR 88 | Ht 70.0 in | Wt 218.0 lb

## 2018-04-10 DIAGNOSIS — F325 Major depressive disorder, single episode, in full remission: Secondary | ICD-10-CM

## 2018-04-10 MED ORDER — FLUOXETINE HCL 40 MG PO CAPS: 40.0000 mg | ORAL_CAPSULE | Freq: Every day | ORAL | 3 refills | Status: DC

## 2018-04-10 NOTE — Progress Notes (Signed)
Psychiatric Initial Adult Assessment   Patient Identification: Jimmy Davis MRN:  [id number] Date of Evaluation:  04/10/2018 Referral Source: hemical dependency IOP Chief Complaint:   Visit Diagnosis: Major depression  History of Present Illness:  Today the patient is back after 4 months.  Presently the patient is still actively using meth.  He uses crystal meth.  He knows he is psychologically dependent on it.  In a rational way he knows that it stayed there is not good that it makes him feel strange in certain ways but also increases his energy keeps him focused and gives of motivation.  As a matter fact he is been working at [employer] for the last 2 months and doing very well.  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  Patient is doing very well at this job.  The patient is going to be then back with his mother.  The patient denies depression.  He is actually sleeping well.  His appetite is good.  He denies any psychotic symptoms at this time.  He denies the use of alcohol.  He is run out of Lamictal.  The patient did not follow-up in our treatment plan that we described on his last visit.  He did not come back to our center for substance abuse treatment.  In the next month he will be getting health insurance at his job and at that time he says he will in fact Revestive self and coming back to the center for therapy.  It does not appear the patient is all that distressed about his use of meth.  The patient is not suicidal.  He is not homicidal.  He denies any chest pain shortness of breath or any physical complaints. Depression Symptoms:  anxiety, (Hypo) Manic Symptoms:   Anxiety Symptoms:   Psychotic Symptoms:   PTSD Symptoms:   Past Psychiatric History: 3 psychiatric hospitalizations multiple psychiatric medications  Previous Psychotropic Medications: yes  Substance Abuse History in the last 12 months:  Yes.    Consequences of Substance Abuse: Negative  Past  Medical History:  Past Medical History:  Diagnosis Date  . Anxiety   . Depression   . Human immunodeficiency virus (HIV) disease (HCC)   . 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 Substance abuse [facility])     Past Surgical History:  Procedure Laterality Date  . WISDOM TOOTH EXTRACTION      Family Psychiatric History:   Family History:  Family History  Problem Relation Age of Onset  . Heart disease Father   . Mental illness Maternal Grandmother     Social History:   Social History   Socioeconomic History  . Marital status: Single    Spouse name: Not on file  . Number of children: Not on file  . Years of education: Not on file  . Highest education level: Not on file  Occupational History  . Occupation: Secretary/administrator: [employer]  Social Needs  . Financial resource strain: Not on file  . Food insecurity:    Worry: Not on file    Inability: Not on file  . Transportation needs:    Medical: Not on file    Non-medical: Not on file  Tobacco Use  . Smoking status: Never Smoker  . Smokeless tobacco: Never Used  Substance and Sexual Activity  . Alcohol use: No    Alcohol/week: 0.0 standard drinks  . Drug use: Yes    Frequency: 4.0 times per week  Types: Methamphetamines    Comment: last use today  . Sexual activity: Yes    Partners: Male    Birth control/protection: None, Condom    Comment: declined condoms  Lifestyle  . Physical activity:    Days per week: Not on file    Minutes per session: Not on file  . Stress: Not on file  Relationships  . Social connections:    Talks on phone: Not on file    Gets together: Not on file    Attends religious service: Not on file    Active member of club or organization: Not on file    Attends meetings of clubs or organizations: Not on file    Relationship status: Not on file  Other Topics Concern  . Not on file  Social History Narrative   Single, 5-6 caffeinated beverages daily previous but not current  illicit drugs   Employed in Audiological scientist business    Additional Social History:    Allergies:   Allergies  Allergen Reactions  . Wellbutrin [Bupropion] Other (See Comments)    Psychotic reaction    Metabolic Disorder Labs: Lab Results  Component Value Date   HGBA1C 5.2 12/07/2014   MPG 103 12/07/2014   No results found for: PROLACTIN Lab Results  Component Value Date   CHOL 214 (H) 09/12/2016   TRIG 139 09/12/2016   HDL 48 09/12/2016   CHOLHDL 4.5 09/12/2016   VLDL 28 09/12/2016   LDLCALC 138 (H) 09/12/2016   LDLCALC 107 09/19/2015     Current Medications: Current Outpatient Medications  Medication Sig Dispense Refill  . acetaminophen (TYLENOL) 500 MG tablet Take 1 tablet (500 mg total) by mouth every 6 (six) hours as needed for mild pain or moderate pain. 30 tablet 0  . aspirin 325 MG EC tablet Take 325 mg by mouth every 4 (four) hours as needed for pain.    Marland Kitchen BIKTARVY 50-200-25 MG TABS tablet TAKE 1 TABLET BY MOUTH DAILY 30 tablet 5  . FLUoxetine (PROZAC) 40 MG capsule Take 1 capsule (40 mg total) by mouth daily. 30 capsule 3  . oxymetazoline (AFRIN) 0.05 % nasal spray Place 1 spray into both nostrils 2 (two) times daily as needed for congestion.    . cetirizine (ZYRTEC) 10 MG tablet Take 1 tablet (10 mg total) by mouth daily. (Patient not taking: Reported on 03/03/2018) 30 tablet 3  . L-Lysine 1000 MG TABS Take 1,000 mg by mouth daily.    Marland Kitchen lamoTRIgine (LAMICTAL) 100 MG tablet Take 1 tablet (100 mg total) by mouth daily. (Patient not taking: Reported on 04/10/2018) 30 tablet 3   No current facility-administered medications for this visit.     Neurologic: Headache: No Seizure: No Paresthesias:No  Musculoskeletal: Strength & Muscle Tone: within normal limits Gait & Station: normal Patient leans: N/A  Psychiatric Specialty Exam: ROS  Blood pressure 130/82, pulse 88, height 5\' 10"  (1.778 m), weight 218 lb (98.9 kg).Body mass index is 31.28 kg/m.  General  Appearance: Casual  Eye Contact:  Good  Speech:  Clear and Coherent  Volume:  Normal  Mood:  Negative  Affect:  Appropriate  Thought Process:  Coherent  Orientation:  NA  Thought Content:  WDL  Suicidal Thoughts:  No  Homicidal Thoughts:  No  Memory:  NA  Judgement:  Good  Insight:  Good  Psychomotor Activity:  Normal  Concentration:    Recall:  Good  Fund of Knowledge:Good  Language: NA  Akathisia:  No  Handed:  Right  AIMS (if indicated):    Assets:  Desire for Improvement  ADL's:  Intact  Cognition: WNL  Sleep:      Treatment Plan Summary: At this time the patient's #1 problem is 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  He uses crystal meth but does not really describe much of a withdrawal effect in a physiological way.  His withdrawal is mainly psychological.  He is dependent restaging to give him energy and activity.  He feels dysphoric.  The patient has a past history of clinical depression.  At this time this is a second problem we will continue his Prozac at 40 mg.  We will not give him Lamictal.  Will not really give him any other medications but again encouraged him to come back into therapy in this setting.  The possibility of using Neurontin in the future for anxiety is considered.  The patient actually is functioning fairly well.  This patient will return to see me in approximately 2 months. Gypsy Balsam, MD 10/4/201910:29 AM

## 2018-07-10 ENCOUNTER — Ambulatory Visit (HOSPITAL_COMMUNITY): Payer: Self-pay | Admitting: Psychiatry

## 2018-07-13 ENCOUNTER — Ambulatory Visit: Payer: Medicaid Other | Admitting: Internal Medicine

## 2018-08-12 ENCOUNTER — Ambulatory Visit (INDEPENDENT_AMBULATORY_CARE_PROVIDER_SITE_OTHER): Payer: Self-pay | Admitting: Internal Medicine

## 2018-08-12 ENCOUNTER — Encounter: Payer: Self-pay | Admitting: Internal Medicine

## 2018-08-12 VITALS — BP 118/83 | HR 88 | Temp 97.9°F | Ht 70.0 in | Wt 221.0 lb

## 2018-08-12 DIAGNOSIS — F1590 Other stimulant use, unspecified, uncomplicated: Secondary | ICD-10-CM

## 2018-08-12 DIAGNOSIS — Z79899 Other long term (current) drug therapy: Secondary | ICD-10-CM

## 2018-08-12 DIAGNOSIS — F1520 Other stimulant dependence, uncomplicated: Secondary | ICD-10-CM

## 2018-08-12 DIAGNOSIS — Z7189 Other specified counseling: Secondary | ICD-10-CM

## 2018-08-12 DIAGNOSIS — B20 Human immunodeficiency virus [HIV] disease: Secondary | ICD-10-CM

## 2018-08-12 NOTE — Patient Instructions (Signed)
Nurse visit hpv vaccine #2 in 1 month

## 2018-08-12 NOTE — Progress Notes (Signed)
Patient ID: Jimmy Davis, male   DOB: 1981/01/29, 38 y.o.   MRN: [id number]  HPI HIV CD 4 count of 580/VL<20 on biktarvy. Hx of meth use- now working at [employer] now PT down to [facility] a new program - outpatient through [facility]. Group therapy x 3 per week. AA mtg thereafter. He has identified his next steps to decrease meth use- Needs to get rid of numbers of dealers.  Not sexually active for the past 4 mo  Outpatient Encounter Medications as of 08/12/2018  Medication Sig  . acetaminophen (TYLENOL) 500 MG tablet Take 1 tablet (500 mg total) by mouth every 6 (six) hours as needed for mild pain or moderate pain.  Marland Kitchen aspirin 325 MG EC tablet Take 325 mg by mouth every 4 (four) hours as needed for pain.  Marland Kitchen BIKTARVY 50-200-25 MG TABS tablet TAKE 1 TABLET BY MOUTH DAILY  . cetirizine (ZYRTEC) 10 MG tablet Take 1 tablet (10 mg total) by mouth daily.  Marland Kitchen FLUoxetine (PROZAC) 40 MG capsule Take 1 capsule (40 mg total) by mouth daily.  Marland Kitchen L-Lysine 1000 MG TABS Take 1,000 mg by mouth daily.  Marland Kitchen oxymetazoline (AFRIN) 0.05 % nasal spray Place 1 spray into both nostrils 2 (two) times daily as needed for congestion.   No facility-administered encounter medications on file as of 08/12/2018.      Patient Active Problem List   Diagnosis Date Noted  . Left leg pain 03/03/2018  . Obesity (BMI 30.0-34.9) 03/26/2017  . Obsessive-compulsive behavior 12/13/2016  . Anxiety 12/11/2016  . Methamphetamine use disorder, severe, dependence (HCC) 12/09/2016  . Major depressive disorder, recurrent episode, moderate (HCC) 11/17/2015  . 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 Depression 10/03/2014  . Human immunodeficiency virus (HIV) disease (HCC) 06/29/2013     Health Maintenance Due  Topic Date Due  . TETANUS/TDAP  07/02/1998     Review of Systems Review of Systems  Constitutional: Negative for fever, chills, diaphoresis, activity change, appetite change, fatigue and unexpected weight change.    HENT: Negative for congestion, sore throat, rhinorrhea, sneezing, trouble swallowing and sinus pressure.  Eyes: Negative for photophobia and visual disturbance.  Respiratory: Negative for cough, chest tightness, shortness of breath, wheezing and stridor.  Cardiovascular: Negative for chest pain, palpitations and leg swelling.  Gastrointestinal: Negative for nausea, vomiting, abdominal pain, diarrhea, constipation, blood in stool, abdominal distention and anal bleeding.  Genitourinary: Negative for dysuria, hematuria, flank pain and difficulty urinating.  Musculoskeletal: Negative for myalgias, back pain, joint swelling, arthralgias and gait problem.  Skin: Negative for color change, pallor, rash and wound.  Neurological: Negative for dizziness, tremors, weakness and light-headedness.  Hematological: Negative for adenopathy. Does not bruise/bleed easily.  Psychiatric/Behavioral: Negative for behavioral problems, confusion, sleep disturbance, dysphoric mood, decreased concentration and agitation.    Physical Exam   BP 118/83   Pulse 88   Temp 97.9 F (36.6 C) (Oral)   Ht 5\' 10"  (1.778 m)   Wt 221 lb (100.2 kg)   BMI 31.71 kg/m   Physical Exam  Constitutional: He is oriented to person, place, and time. He appears well-developed and well-nourished. No distress.  HENT:  Mouth/Throat: Oropharynx is clear and moist. No oropharyngeal exudate.  Cardiovascular: Normal rate, regular rhythm and normal heart sounds. Exam reveals no gallop and no friction rub.  No murmur heard.  Pulmonary/Chest: Effort normal and breath sounds normal. No respiratory distress. He has no wheezes.  Abdominal: Soft. Bowel sounds are normal. He exhibits no  distension. There is no tenderness.  Lymphadenopathy:  He has no cervical adenopathy.  Neurological: He is alert and oriented to person, place, and time.  Skin: Skin is warm and dry. No rash noted. No erythema.  Psychiatric: He has a normal mood and affect. His  behavior is normal.    Lab Results  Component Value Date   CD4TCELL 27 (L) 03/11/2018   Lab Results  Component Value Date   CD4TABS 580 03/11/2018   CD4TABS 650 09/08/2017   CD4TABS 660 04/08/2017   Lab Results  Component Value Date   HIV1RNAQUANT <20 NOT DETECTED 03/11/2018   Lab Results  Component Value Date   HEPBSAB NEG 08/17/2013   Lab Results  Component Value Date   LABRPR NON-REACTIVE 03/11/2018    CBC Lab Results  Component Value Date   WBC 6.8 03/11/2018   RBC 5.07 03/11/2018   HGB 15.7 03/11/2018   HCT 46.0 03/11/2018   PLT 315 03/11/2018   MCV 90.7 03/11/2018   MCH 31.0 03/11/2018   MCHC 34.1 03/11/2018   RDW 13.0 03/11/2018   LYMPHSABS 2,176 03/11/2018   MONOABS 0.6 09/28/2017   EOSABS 211 03/11/2018    BMET Lab Results  Component Value Date   NA 138 03/11/2018   K 4.2 03/11/2018   CL 101 03/11/2018   CO2 28 03/11/2018   GLUCOSE 76 03/11/2018   BUN 10 03/11/2018   CREATININE 1.06 03/11/2018   CALCIUM 9.9 03/11/2018   GFRNONAA 89 03/11/2018   GFRAA 103 03/11/2018      Assessment and Plan  hiv disease= labs today. Has good adherence. Anticipate he will continue on current regimen.  Meth use = encouraged his progress with starting new substance abuse treatment program and plans for attending AA daily  Health promotion - will do hpv vaccine series Renew adap

## 2018-08-14 DIAGNOSIS — Z23 Encounter for immunization: Secondary | ICD-10-CM

## 2018-08-14 LAB — COMPLETE METABOLIC PANEL WITH GFR
AG Ratio: 1.6 (calc) (ref 1.0–2.5)
ALBUMIN MSPROF: 4.7 g/dL (ref 3.6–5.1)
ALT: 36 U/L (ref 9–46)
AST: 23 U/L (ref 10–40)
Alkaline phosphatase (APISO): 103 U/L (ref 36–130)
BUN: 13 mg/dL (ref 7–25)
CALCIUM: 9.5 mg/dL (ref 8.6–10.3)
CO2: 27 mmol/L (ref 20–32)
CREATININE: 0.99 mg/dL (ref 0.60–1.35)
Chloride: 103 mmol/L (ref 98–110)
GFR, EST NON AFRICAN AMERICAN: 96 mL/min/{1.73_m2} (ref >=60)
GFR, Est African American: 111 mL/min/{1.73_m2} (ref >=60)
GLOBULIN: 2.9 g/dL (ref 1.9–3.7)
GLUCOSE: 87 mg/dL (ref 65–99)
Potassium: 4.7 mmol/L (ref 3.5–5.3)
Sodium: 138 mmol/L (ref 135–146)
Total Bilirubin: 0.3 mg/dL (ref 0.2–1.2)
Total Protein: 7.6 g/dL (ref 6.1–8.1)

## 2018-08-14 LAB — RPR: RPR Ser Ql: NONREACTIVE

## 2018-08-14 LAB — CBC WITH DIFFERENTIAL/PLATELET
Absolute Monocytes: 538 cells/uL (ref 200–950)
BASOS ABS: 47 {cells}/uL (ref 0–200)
Basophils Relative: 0.6 %
EOS PCT: 2.4 %
Eosinophils Absolute: 187 cells/uL (ref 15–500)
HEMATOCRIT: 45.4 % (ref 38.5–50.0)
Hemoglobin: 15.7 g/dL (ref 13.2–17.1)
LYMPHS ABS: 2566 {cells}/uL (ref 850–3900)
MCH: 30.5 pg (ref 27.0–33.0)
MCHC: 34.6 g/dL (ref 32.0–36.0)
MCV: 88.3 fL (ref 80.0–100.0)
MPV: 9 fL (ref 7.5–12.5)
Monocytes Relative: 6.9 %
NEUTROS PCT: 57.2 %
Neutro Abs: 4462 cells/uL (ref 1500–7800)
Platelets: 321 10*3/uL (ref 140–400)
RBC: 5.14 10*6/uL (ref 4.20–5.80)
RDW: 12.8 % (ref 11.0–15.0)
TOTAL LYMPHOCYTE: 32.9 %
WBC: 7.8 10*3/uL (ref 3.8–10.8)

## 2018-08-14 LAB — T-HELPER CELL (CD4) - (RCID CLINIC ONLY)
CD4 T CELL ABS: 750 /uL (ref 400–2700)
CD4 T CELL HELPER: 30 % — AB (ref 33–55)

## 2018-08-14 LAB — HIV-1 RNA QUANT-NO REFLEX-BLD
HIV 1 RNA Quant: <20 copies/mL
HIV-1 RNA QUANT, LOG: NOT DETECTED {Log_copies}/mL

## 2018-09-16 ENCOUNTER — Telehealth (HOSPITAL_COMMUNITY): Payer: Self-pay | Admitting: Licensed Clinical Social Worker

## 2018-09-16 NOTE — Telephone Encounter (Signed)
PT called to inquire about recommendation for residential tx. PT was told to pursue [facility] since this was in-network w/ his specific insurance.

## 2018-09-24 ENCOUNTER — Encounter: Payer: Self-pay | Admitting: Internal Medicine

## 2018-09-24 ENCOUNTER — Other Ambulatory Visit (HOSPITAL_COMMUNITY): Payer: Self-pay | Admitting: Psychiatry

## 2018-09-24 ENCOUNTER — Other Ambulatory Visit: Payer: Self-pay | Admitting: Internal Medicine

## 2018-10-06 IMAGING — CR DG CHEST 2V
2 series · 2 of 2 positions shown · non-contrast
Comparison: 06/25/2015

CLINICAL DATA: Cough and shortness of breath for several hours

EXAM:
CHEST - 2 VIEW

[w chest pa]
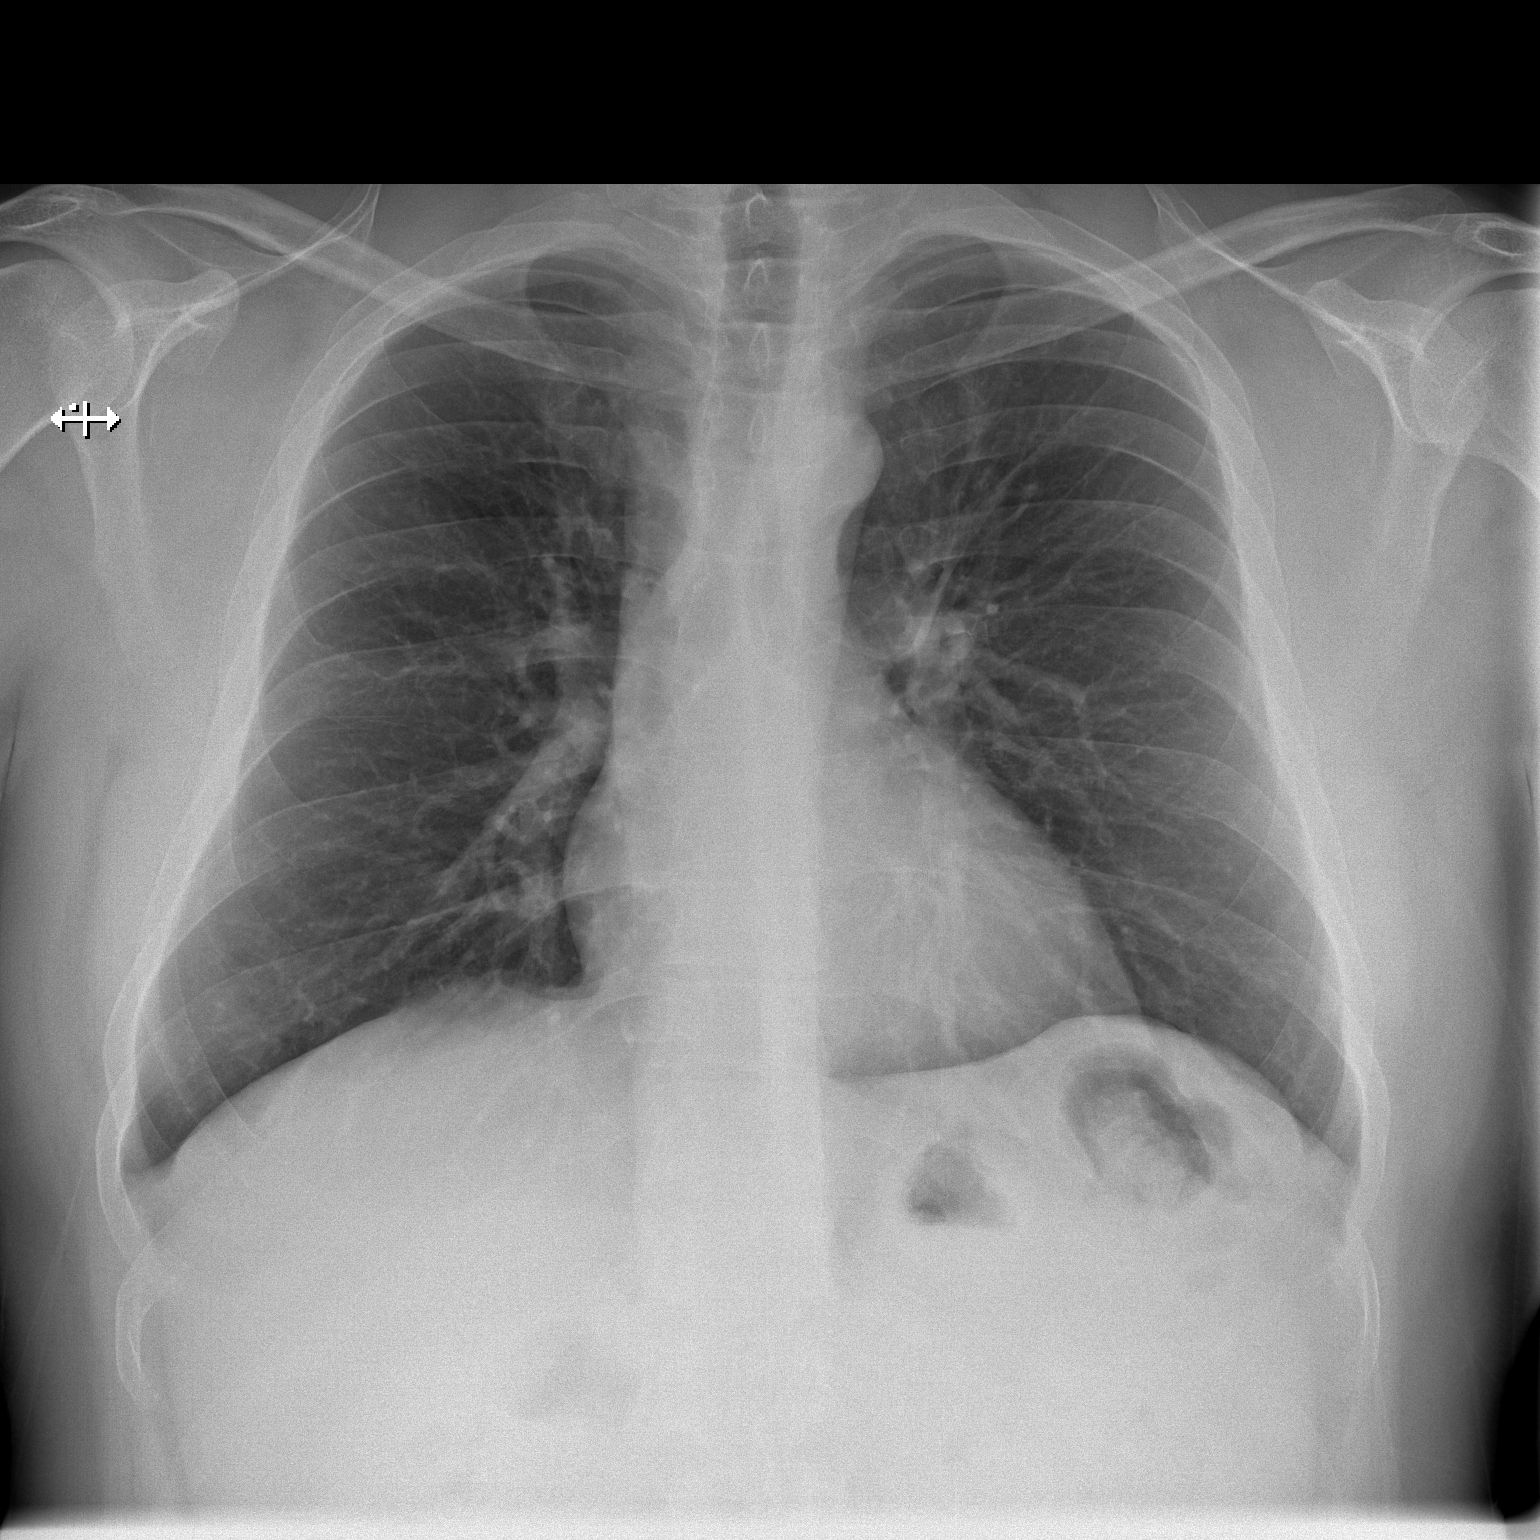

[w chest lat]
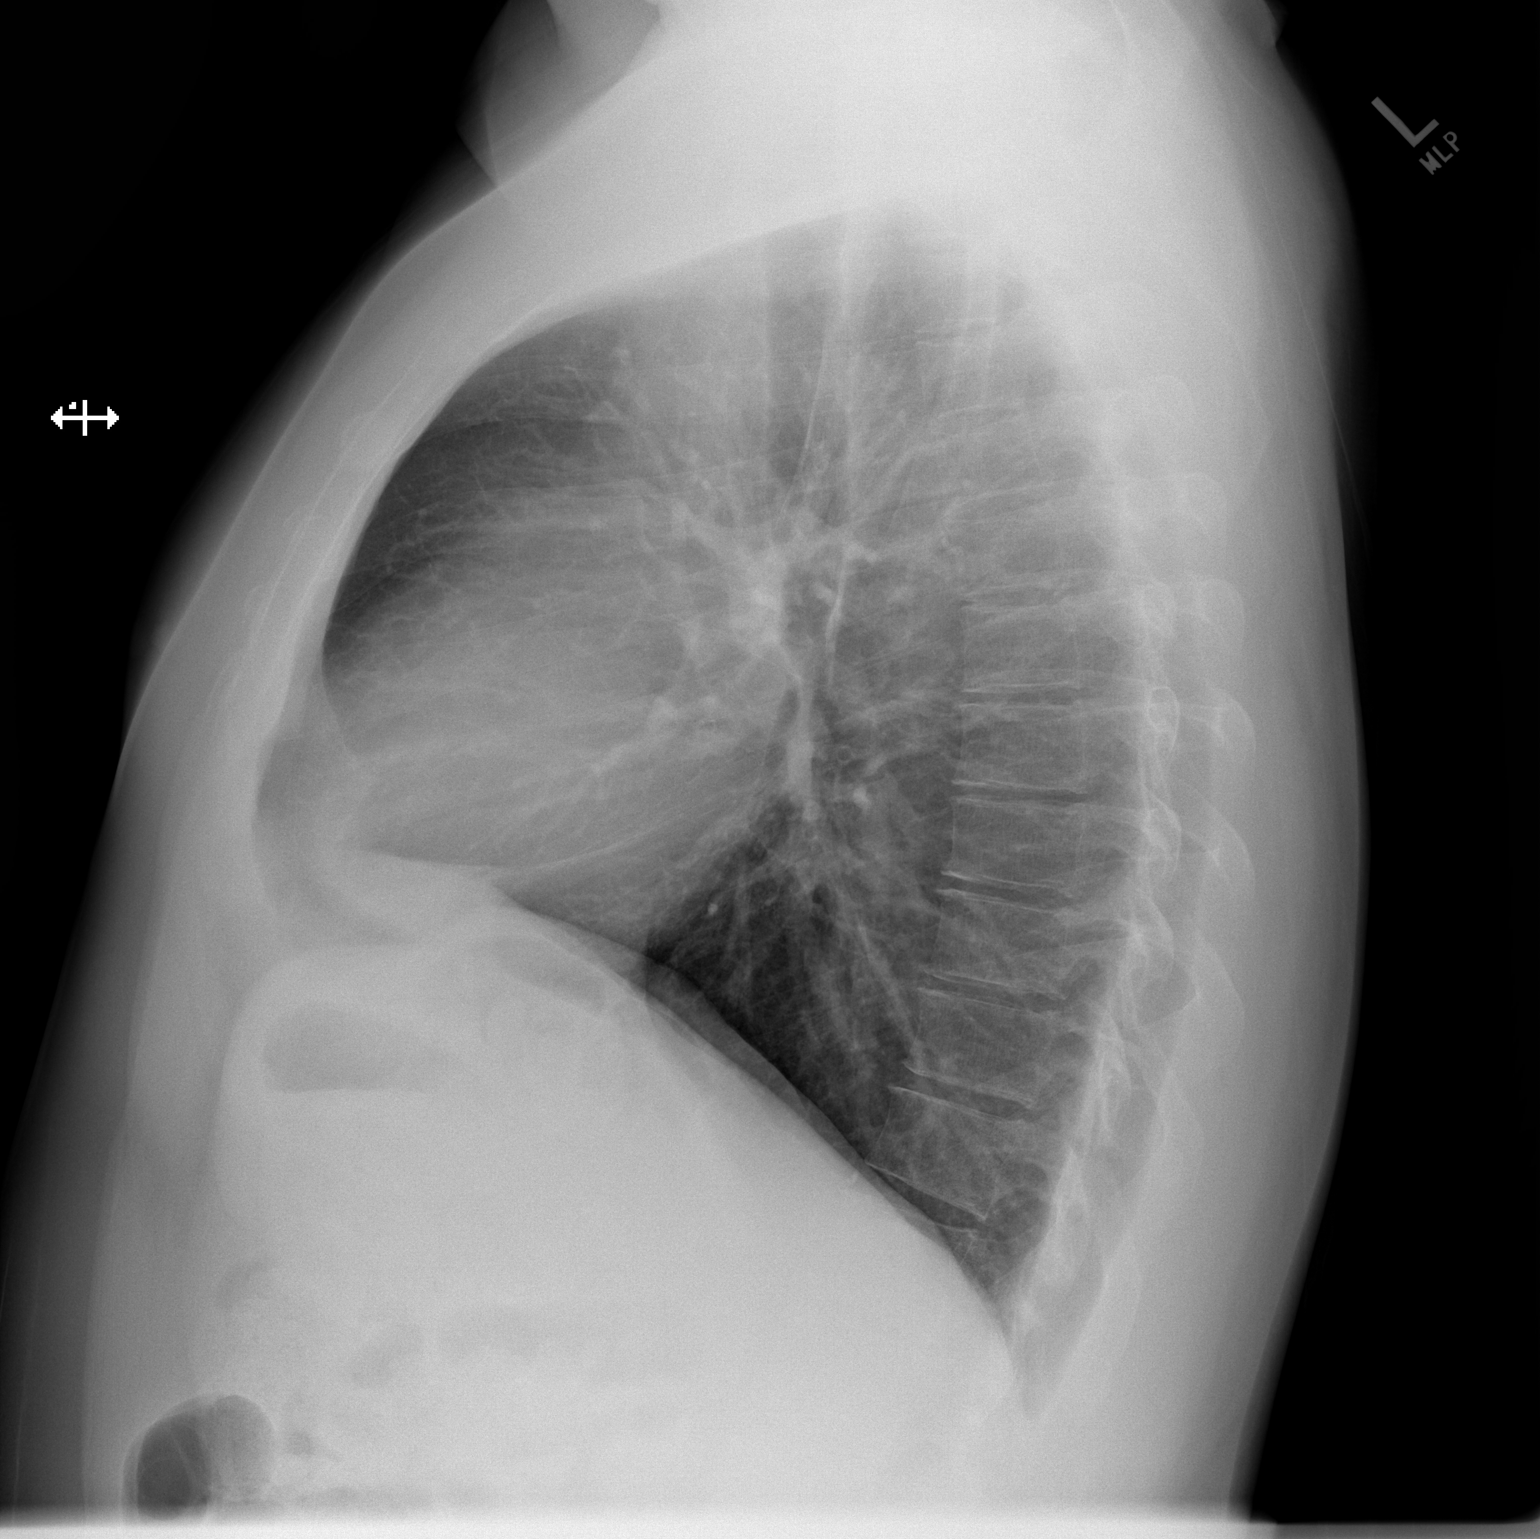

[2 of 2 positions shown; findings below may reference images not displayed]

FINDINGS: The heart size and mediastinal contours are within normal limits.
Both lungs are clear. The visualized skeletal structures are
unremarkable.
IMPRESSION: No active cardiopulmonary disease.

## 2018-10-09 NOTE — Telephone Encounter (Signed)
Needs  An appt 

## 2018-10-28 ENCOUNTER — Other Ambulatory Visit: Payer: Self-pay | Admitting: Behavioral Health

## 2018-10-28 DIAGNOSIS — B20 Human immunodeficiency virus [HIV] disease: Secondary | ICD-10-CM

## 2018-10-28 MED ORDER — BICTEGRAVIR-EMTRICITAB-TENOFOV 50-200-25 MG PO TABS: 1.0000 | ORAL_TABLET | Freq: Every day | ORAL | 0 refills | Status: DC

## 2018-10-28 MED ORDER — BICTEGRAVIR-EMTRICITAB-TENOFOV 50-200-25 MG PO TABS: 1.0000 | ORAL_TABLET | Freq: Every day | ORAL | 1 refills | Status: DC

## 2018-11-11 ENCOUNTER — Ambulatory Visit: Payer: Medicaid Other | Admitting: Internal Medicine

## 2018-11-11 ENCOUNTER — Telehealth (HOSPITAL_COMMUNITY): Payer: Self-pay | Admitting: Licensed Clinical Social Worker

## 2018-11-11 NOTE — Telephone Encounter (Signed)
PT called to request entry into CD-IOP. PT's current insurance plan does not cover tx at this site and PT was instructed to contact his insurance and request for approval to bill this program since he was denied services at the "in-network" CD-IOP provider. PT agreed to inquire w/ his insurance about this and would call back with their response.

## 2018-11-18 ENCOUNTER — Telehealth (HOSPITAL_COMMUNITY): Payer: Self-pay | Admitting: Licensed Clinical Social Worker

## 2018-11-18 NOTE — Telephone Encounter (Signed)
Called PT to further inquire about his start to CD-IOP. PT states he "has decided to go to inpatient tx for substance abuse". PT is in contact w/ a provider in [place] and plans to contact this CD-IOP upon his D/C from in patient tx.

## 2018-12-21 ENCOUNTER — Other Ambulatory Visit: Payer: Self-pay

## 2018-12-21 ENCOUNTER — Encounter (HOSPITAL_COMMUNITY): Payer: Self-pay

## 2018-12-21 ENCOUNTER — Ambulatory Visit (HOSPITAL_COMMUNITY): Payer: Medicaid Other | Admitting: Psychology

## 2018-12-21 ENCOUNTER — Encounter (HOSPITAL_COMMUNITY): Payer: Self-pay | Admitting: Psychology

## 2018-12-21 NOTE — Progress Notes (Signed)
Patient ID: Jimmy Davis, male   DOB: 12-24-1980, 38 y.o.   MRN: [id number] CD-IOP: Counselor met with the patient to complete a CCA. He had just returned this past Saturday, June 13, from 30 days of residential treatment at "[facility]" in [place]. He shared that he is still living in [place] with his mother, but working at [employer] here in [place]. He had been enrolled in the CD-IOP at [facility] in [place], but went into rehab because he had relapsed. They didn't want him to return until he had completed residential treatment. We discussed that he had a relationship and history at [facility] and that they accepted his insurance. We are not on the approved provider list of his insurance company and it may require out of pocket, which he cannot afford. It also seems much more logical for him to attend the program in [place] versus driving all the way from [place] to [place] three days per week. The patient agreed he would contact the program at [facility] and contact me about whether he wants to return to their CD-IOP or come back to ours. Will wait to hear from him.

## 2019-01-18 ENCOUNTER — Ambulatory Visit: Payer: Medicaid Other | Admitting: Internal Medicine

## 2019-01-20 ENCOUNTER — Other Ambulatory Visit: Payer: Self-pay | Admitting: Internal Medicine

## 2019-01-20 DIAGNOSIS — B20 Human immunodeficiency virus [HIV] disease: Secondary | ICD-10-CM

## 2019-01-25 ENCOUNTER — Encounter: Payer: Self-pay | Admitting: Internal Medicine

## 2019-01-25 ENCOUNTER — Ambulatory Visit (INDEPENDENT_AMBULATORY_CARE_PROVIDER_SITE_OTHER): Payer: BLUE CROSS/BLUE SHIELD | Admitting: Internal Medicine

## 2019-01-25 ENCOUNTER — Other Ambulatory Visit: Payer: Self-pay

## 2019-01-25 VITALS — BP 144/81 | HR 112 | Temp 96.6°F | Wt 244.0 lb

## 2019-01-25 DIAGNOSIS — B20 Human immunodeficiency virus [HIV] disease: Secondary | ICD-10-CM

## 2019-01-25 DIAGNOSIS — F329 Major depressive disorder, single episode, unspecified: Secondary | ICD-10-CM

## 2019-01-25 DIAGNOSIS — F32A Depression, unspecified: Secondary | ICD-10-CM

## 2019-01-25 DIAGNOSIS — Z79899 Other long term (current) drug therapy: Secondary | ICD-10-CM | POA: Diagnosis not present

## 2019-01-25 NOTE — Progress Notes (Signed)
RFV: follow up with hiv disease  Patient ID: Jimmy Davis, male   DOB: 08-18-1980, 38 y.o.   MRN: [id number]  HPI Jimmy Davis is a 38yo M with hiv disease, CD 4 count of 750/VL<20 in biktarvy. Has been attending groups and refrain from drug use.   Outpatient Encounter Medications as of 01/25/2019  Medication Sig  . acetaminophen (TYLENOL) 500 MG tablet Take 1 tablet (500 mg total) by mouth every 6 (six) hours as needed for mild pain or moderate pain.  Marland Kitchen. aspirin 325 MG EC tablet Take 325 mg by mouth every 4 (four) hours as needed for pain.  Marland Kitchen. atomoxetine (STRATTERA) 40 MG capsule Take by mouth.  Marland Kitchen. BIKTARVY 50-200-25 MG TABS tablet TAKE 1 TABLET BY MOUTH DAILY  . cetirizine (ZYRTEC) 10 MG tablet Take 1 tablet (10 mg total) by mouth daily.  . mirtazapine (REMERON) 30 MG tablet Take by mouth.  . venlafaxine XR (EFFEXOR-XR) 37.5 MG 24 hr capsule Take 37.5 mg daily for 1 week. If no issues after one week trial, increase to 75 mg  . FLUoxetine (PROZAC) 40 MG capsule Take 1 capsule (40 mg total) by mouth daily. (Patient not taking: Reported on 01/25/2019)  . L-Lysine 1000 MG TABS Take 1,000 mg by mouth daily.  Marland Kitchen. oxymetazoline (AFRIN) 0.05 % nasal spray Place 1 spray into both nostrils 2 (two) times daily as needed for congestion.   No facility-administered encounter medications on file as of 01/25/2019.      Patient Active Problem List   Diagnosis Date Noted  . Left leg pain 03/03/2018  . Obesity (BMI 30.0-34.9) 03/26/2017  . Obsessive-compulsive behavior 12/13/2016  . Anxiety 12/11/2016  . Methamphetamine use disorder, severe, dependence (HCC) 12/09/2016  . Major depressive disorder, recurrent episode, moderate (HCC) 11/17/2015  . 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 Depression 10/03/2014  . Human immunodeficiency virus (HIV) disease (HCC) 06/29/2013     Health Maintenance Due  Topic Date Due  . Janet BerlinETANUS/TDAP  07/02/1998    Social History   Tobacco Use  . Smoking status: Never  Smoker  . Smokeless tobacco: Never Used  Substance Use Topics  . Alcohol use: No    Alcohol/week: 0.0 standard drinks  . Drug use: Yes    Frequency: 4.0 times per week    Types: Methamphetamines    Comment: last use today    Review of Systems Review of Systems  Constitutional: Negative for fever, chills, diaphoresis, activity change, appetite change, fatigue and unexpected weight change.  HENT: Negative for congestion, sore throat, rhinorrhea, sneezing, trouble swallowing and sinus pressure.  Eyes: Negative for photophobia and visual disturbance.  Respiratory: Negative for cough, chest tightness, shortness of breath, wheezing and stridor.  Cardiovascular: Negative for chest pain, palpitations and leg swelling.  Gastrointestinal: Negative for nausea, vomiting, abdominal pain, diarrhea, constipation, blood in stool, abdominal distention and anal bleeding.  Genitourinary: Negative for dysuria, hematuria, flank pain and difficulty urinating.  Musculoskeletal: Negative for myalgias, back pain, joint swelling, arthralgias and gait problem.  Skin: Negative for color change, pallor, rash and wound.  Neurological: Negative for dizziness, tremors, weakness and light-headedness.  Hematological: Negative for adenopathy. Does not bruise/bleed easily.  Psychiatric/Behavioral: Negative for behavioral problems, confusion, sleep disturbance, dysphoric mood, decreased concentration and agitation.    Physical Exam   BP (!) 144/81   Pulse (!) 112   Temp (!) 96.6 F (35.9 C) (Temporal)   Wt 244 lb (110.7 kg)   BMI 35.01 kg/m   Physical Exam  Constitutional: He  is oriented to person, place, and time. He appears well-developed and well-nourished. No distress.  HENT:  Mouth/Throat: Oropharynx is clear and moist. No oropharyngeal exudate.  Cardiovascular: Normal rate, regular rhythm and normal heart sounds. Exam reveals no gallop and no friction rub.  No murmur heard.  Pulmonary/Chest: Effort normal  and breath sounds normal. No respiratory distress. He has no wheezes.  Abdominal: Soft. Bowel sounds are normal. He exhibits no distension. There is no tenderness.  Lymphadenopathy:  He has no cervical adenopathy.  Neurological: He is alert and oriented to person, place, and time.  Skin: Skin is warm and dry. No rash noted. No erythema.  Psychiatric: He has a normal mood and affect. His behavior is normal.    Lab Results  Component Value Date   CD4TCELL 30 (L) 08/12/2018   Lab Results  Component Value Date   CD4TABS 750 08/12/2018   CD4TABS 580 03/11/2018   CD4TABS 650 09/08/2017   Lab Results  Component Value Date   HIV1RNAQUANT <20 NOT DETECTED 08/12/2018   Lab Results  Component Value Date   HEPBSAB NEG 08/17/2013   Lab Results  Component Value Date   LABRPR NON-REACTIVE 08/12/2018    CBC Lab Results  Component Value Date   WBC 7.8 08/12/2018   RBC 5.14 08/12/2018   HGB 15.7 08/12/2018   HCT 45.4 08/12/2018   PLT 321 08/12/2018   MCV 88.3 08/12/2018   MCH 30.5 08/12/2018   MCHC 34.6 08/12/2018   RDW 12.8 08/12/2018   LYMPHSABS 2,566 08/12/2018   MONOABS 0.6 09/28/2017   EOSABS 187 08/12/2018    BMET Lab Results  Component Value Date   NA 138 08/12/2018   K 4.7 08/12/2018   CL 103 08/12/2018   CO2 27 08/12/2018   GLUCOSE 87 08/12/2018   BUN 13 08/12/2018   CREATININE 0.99 08/12/2018   CALCIUM 9.5 08/12/2018   GFRNONAA 96 08/12/2018   GFRAA 111 08/12/2018      Assessment and Plan  hiv disease= continue on excellent adherence  Depression = continue iwht work with counseling and adhere to medication  Long term medication management = stable cr. No need for any changes

## 2019-02-02 ENCOUNTER — Encounter: Payer: Self-pay | Admitting: Internal Medicine

## 2019-04-05 ENCOUNTER — Ambulatory Visit: Payer: BLUE CROSS/BLUE SHIELD | Admitting: Internal Medicine

## 2019-05-13 ENCOUNTER — Other Ambulatory Visit: Payer: Self-pay

## 2019-05-13 ENCOUNTER — Encounter: Payer: Self-pay | Admitting: Internal Medicine

## 2019-05-13 ENCOUNTER — Ambulatory Visit (INDEPENDENT_AMBULATORY_CARE_PROVIDER_SITE_OTHER): Payer: BLUE CROSS/BLUE SHIELD | Admitting: Internal Medicine

## 2019-05-13 VITALS — BP 141/94 | HR 111 | Temp 97.3°F | Wt 252.0 lb

## 2019-05-13 DIAGNOSIS — B20 Human immunodeficiency virus [HIV] disease: Secondary | ICD-10-CM | POA: Diagnosis not present

## 2019-05-13 DIAGNOSIS — Z79899 Other long term (current) drug therapy: Secondary | ICD-10-CM | POA: Diagnosis not present

## 2019-05-13 DIAGNOSIS — Z23 Encounter for immunization: Secondary | ICD-10-CM

## 2019-05-13 DIAGNOSIS — F1520 Other stimulant dependence, uncomplicated: Secondary | ICD-10-CM | POA: Diagnosis not present

## 2019-05-13 NOTE — Progress Notes (Signed)
Patient ID: Jimmy Davis, male   DOB: [birth year], 38 y.o.   MRN: [id number]  HPI Jimmy Davis is a 38yo M with well controlled hiv disease, CD 4 count of 750/VL<20 in feb 2020 on biktarvy. Has had 6 months of sobriety from meth use but has noticed increasing weight gain. He realizes that he has to do more exercise and diet modification. No recent illness. No covid exposure Outpatient Encounter Medications as of 05/13/2019  Medication Sig  . acetaminophen (TYLENOL) 500 MG tablet Take 1 tablet (500 mg total) by mouth every 6 (six) hours as needed for mild pain or moderate pain.  Marland Kitchen aspirin 325 MG EC tablet Take 325 mg by mouth every 4 (four) hours as needed for pain.  Marland Kitchen atomoxetine (STRATTERA) 40 MG capsule Take by mouth.  Marland Kitchen BIKTARVY 50-200-25 MG TABS tablet TAKE 1 TABLET BY MOUTH DAILY  . cetirizine (ZYRTEC) 10 MG tablet Take 1 tablet (10 mg total) by mouth daily.  Marland Kitchen FLUoxetine (PROZAC) 40 MG capsule Take 1 capsule (40 mg total) by mouth daily. (Patient not taking: Reported on 01/25/2019)  . L-Lysine 1000 MG TABS Take 1,000 mg by mouth daily.  . mirtazapine (REMERON) 30 MG tablet Take by mouth.  Marland Kitchen oxymetazoline (AFRIN) 0.05 % nasal spray Place 1 spray into both nostrils 2 (two) times daily as needed for congestion.  Marland Kitchen venlafaxine XR (EFFEXOR-XR) 37.5 MG 24 hr capsule Take 37.5 mg daily for 1 week. If no issues after one week trial, increase to 75 mg   No facility-administered encounter medications on file as of 05/13/2019.      Patient Active Problem List   Diagnosis Date Noted  . Left leg pain 03/03/2018  . Obesity (BMI 30.0-34.9) 03/26/2017  . Obsessive-compulsive behavior 12/13/2016  . Anxiety 12/11/2016  . Methamphetamine use disorder, severe, dependence ([place]) 12/09/2016  . Major depressive disorder, recurrent episode, moderate ([place]) 11/17/2015  . 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 Depression 10/03/2014  . Human immunodeficiency virus (HIV) disease ([place]) 06/29/2013      Health Maintenance Due  Topic Date Due  . TETANUS/TDAP  07/02/1998  . INFLUENZA VACCINE  02/06/2019     Review of Systems Review of Systems  Constitutional: Negative for fever, chills, diaphoresis, activity change, appetite change, fatigue except + weight change.  HENT: Negative for congestion, sore throat, rhinorrhea, sneezing, trouble swallowing and sinus pressure.  Eyes: Negative for photophobia and visual disturbance.  Respiratory: Negative for cough, chest tightness, shortness of breath, wheezing and stridor.  Cardiovascular: Negative for chest pain, palpitations and leg swelling.  Gastrointestinal: Negative for nausea, vomiting, abdominal pain, diarrhea, constipation, blood in stool, abdominal distention and anal bleeding.  Genitourinary: Negative for dysuria, hematuria, flank pain and difficulty urinating.  Musculoskeletal: Negative for myalgias, back pain, joint swelling, arthralgias and gait problem.  Skin: Negative for color change, pallor, rash and wound.  Neurological: Negative for dizziness, tremors, weakness and light-headedness.  Hematological: Negative for adenopathy. Does not bruise/bleed easily.  Psychiatric/Behavioral: Negative for behavioral problems, confusion, sleep disturbance, dysphoric mood, decreased concentration and agitation.     Physical Exam   BP (!) 141/94   Pulse (!) 111   Temp (!) 97.3 F (36.3 C) (Oral)   Wt 252 lb (114.3 kg)   BMI 36.16 kg/m   Physical Exam  Constitutional: He is oriented to person, place, and time. He appears well-developed and well-nourished. No distress.  HENT:  Mouth/Throat: Oropharynx is clear and moist. No oropharyngeal exudate.  Cardiovascular: Normal rate, regular rhythm  and normal heart sounds. Exam reveals no gallop and no friction rub.  No murmur heard.  Pulmonary/Chest: Effort normal and breath sounds normal. No respiratory distress. He has no wheezes.  Abdominal: Soft. Bowel sounds are normal. He exhibits no  distension. There is no tenderness.  Lymphadenopathy:  He has no cervical adenopathy.  Neurological: He is alert and oriented to person, place, and time.  Skin: Skin is warm and dry. No rash noted. No erythema.  Psychiatric: He has a normal mood and affect. His behavior is normal.    Lab Results  Component Value Date   CD4TCELL 30 (L) 08/12/2018   Lab Results  Component Value Date   CD4TABS 750 08/12/2018   CD4TABS 580 03/11/2018   CD4TABS 650 09/08/2017   Lab Results  Component Value Date   HIV1RNAQUANT <20 NOT DETECTED 08/12/2018   Lab Results  Component Value Date   HEPBSAB NEG 08/17/2013   Lab Results  Component Value Date   LABRPR NON-REACTIVE 08/12/2018    CBC Lab Results  Component Value Date   WBC 7.8 08/12/2018   RBC 5.14 08/12/2018   HGB 15.7 08/12/2018   HCT 45.4 08/12/2018   PLT 321 08/12/2018   MCV 88.3 08/12/2018   MCH 30.5 08/12/2018   MCHC 34.6 08/12/2018   RDW 12.8 08/12/2018   LYMPHSABS 2,566 08/12/2018   MONOABS 0.6 09/28/2017   EOSABS 187 08/12/2018    BMET Lab Results  Component Value Date   NA 138 08/12/2018   K 4.7 08/12/2018   CL 103 08/12/2018   CO2 27 08/12/2018   GLUCOSE 87 08/12/2018   BUN 13 08/12/2018   CREATININE 0.99 08/12/2018   CALCIUM 9.5 08/12/2018   GFRNONAA 96 08/12/2018   GFRAA 111 08/12/2018      Assessment and Plan hiv disease = will get labs, refill biktarvy  Health maintenance = hpv and flu vaccine today  Meth use = continue with support groups. Congratulated on 6 month sobriety  Long term medication management = cr is stable  rtc 3 mo

## 2019-05-14 LAB — T-HELPER CELL (CD4) - (RCID CLINIC ONLY)
CD4 % Helper T Cell: 32 % — ABNORMAL LOW (ref 33–65)
CD4 T Cell Abs: 944 /uL (ref 400–1790)

## 2019-05-14 LAB — URINE CYTOLOGY ANCILLARY ONLY
Chlamydia: NEGATIVE
Comment: NEGATIVE
Comment: NORMAL
Neisseria Gonorrhea: NEGATIVE

## 2019-05-16 LAB — COMPLETE METABOLIC PANEL WITH GFR
AG Ratio: 1.6 (calc) (ref 1.0–2.5)
ALT: 23 U/L (ref 9–46)
AST: 18 U/L (ref 10–40)
Albumin: 4.6 g/dL (ref 3.6–5.1)
Alkaline phosphatase (APISO): 136 U/L — ABNORMAL HIGH (ref 36–130)
BUN: 10 mg/dL (ref 7–25)
CO2: 26 mmol/L (ref 20–32)
Calcium: 9.7 mg/dL (ref 8.6–10.3)
Chloride: 102 mmol/L (ref 98–110)
Creat: 1.14 mg/dL (ref 0.60–1.35)
GFR, Est African American: 93 mL/min/{1.73_m2} (ref >=60)
GFR, Est Non African American: 81 mL/min/{1.73_m2} (ref >=60)
Globulin: 2.9 g/dL (calc) (ref 1.9–3.7)
Glucose, Bld: 86 mg/dL (ref 65–99)
Potassium: 4.1 mmol/L (ref 3.5–5.3)
Sodium: 138 mmol/L (ref 135–146)
Total Bilirubin: 0.5 mg/dL (ref 0.2–1.2)
Total Protein: 7.5 g/dL (ref 6.1–8.1)

## 2019-05-16 LAB — CBC WITH DIFFERENTIAL/PLATELET
Absolute Monocytes: 504 cells/uL (ref 200–950)
Basophils Absolute: 62 cells/uL (ref 0–200)
Basophils Relative: 0.9 %
Eosinophils Absolute: 221 cells/uL (ref 15–500)
Eosinophils Relative: 3.2 %
HCT: 43.6 % (ref 38.5–50.0)
Hemoglobin: 15.1 g/dL (ref 13.2–17.1)
Lymphs Abs: 2677 cells/uL (ref 850–3900)
MCH: 30.1 pg (ref 27.0–33.0)
MCHC: 34.6 g/dL (ref 32.0–36.0)
MCV: 86.9 fL (ref 80.0–100.0)
MPV: 9.4 fL (ref 7.5–12.5)
Monocytes Relative: 7.3 %
Neutro Abs: 3436 cells/uL (ref 1500–7800)
Neutrophils Relative %: 49.8 %
Platelets: 293 10*3/uL (ref 140–400)
RBC: 5.02 10*6/uL (ref 4.20–5.80)
RDW: 13 % (ref 11.0–15.0)
Total Lymphocyte: 38.8 %
WBC: 6.9 10*3/uL (ref 3.8–10.8)

## 2019-05-16 LAB — LIPID PANEL
Cholesterol: 198 mg/dL (ref <200)
HDL: 39 mg/dL — ABNORMAL LOW (ref >=40)
LDL Cholesterol (Calc): 124 mg/dL (calc) — ABNORMAL HIGH
Non-HDL Cholesterol (Calc): 159 mg/dL (calc) — ABNORMAL HIGH (ref <130)
Total CHOL/HDL Ratio: 5.1 (calc) — ABNORMAL HIGH (ref <5.0)
Triglycerides: 234 mg/dL — ABNORMAL HIGH (ref <150)

## 2019-05-16 LAB — RPR: RPR Ser Ql: NONREACTIVE

## 2019-05-16 LAB — HEPATITIS C ANTIBODY
Hepatitis C Ab: NONREACTIVE
SIGNAL TO CUT-OFF: 0.65 (ref <1.00)

## 2019-05-16 LAB — HIV-1 RNA QUANT-NO REFLEX-BLD
HIV 1 RNA Quant: <20 copies/mL
HIV-1 RNA Quant, Log: <1.3 Log copies/mL

## 2019-07-23 ENCOUNTER — Other Ambulatory Visit: Payer: Self-pay | Admitting: Internal Medicine

## 2019-07-23 DIAGNOSIS — B20 Human immunodeficiency virus [HIV] disease: Secondary | ICD-10-CM

## 2019-08-17 ENCOUNTER — Ambulatory Visit: Payer: BLUE CROSS/BLUE SHIELD | Admitting: Internal Medicine

## 2019-08-18 ENCOUNTER — Ambulatory Visit: Payer: BLUE CROSS/BLUE SHIELD | Admitting: Internal Medicine

## 2019-08-26 ENCOUNTER — Ambulatory Visit: Payer: BLUE CROSS/BLUE SHIELD | Admitting: Internal Medicine

## 2019-08-27 ENCOUNTER — Other Ambulatory Visit: Payer: Self-pay | Admitting: Internal Medicine

## 2019-08-27 DIAGNOSIS — B20 Human immunodeficiency virus [HIV] disease: Secondary | ICD-10-CM

## 2019-09-08 ENCOUNTER — Telehealth: Payer: Self-pay | Admitting: Pharmacy Technician

## 2019-09-08 NOTE — Telephone Encounter (Signed)
[facility] Patient Advocate Encounter  Completed and sent [employer] Advancing Access application for Biktarvy for this patient who is uninsured.    Patient is approved 09/08/2019 through 10/09/2019.  BIN      [id number] PCN    [id number] GRP    [id number] ID        [id number]  Application has been submitted for additional months of assistance.   Netty Starring. Dimas Aguas CPhT Specialty Pharmacy Patient [facility] for Infectious Disease Phone: [phone] Fax:  [phone]

## 2019-09-13 NOTE — Telephone Encounter (Signed)
[facility] Patient Advocate Encounter   Patient has been approved for [employer] Advancing Access Patient Assistance Program for Biktarvy  up to 12 months as of 09/11/2019. This assistance will make the patient's copay $0.  The billing information is same as listed below.  Patient knows to call the office with questions or concerns.  Beulah Gandy, CPhT Specialty Pharmacy Patient [facility] for Infectious Disease Phone: [phone] Fax: [phone] 09/13/2019 12:16 PM

## 2019-09-29 ENCOUNTER — Ambulatory Visit: Payer: BLUE CROSS/BLUE SHIELD | Admitting: Internal Medicine

## 2019-09-30 NOTE — Telephone Encounter (Signed)
Spoke with patient. He would like one more appointment (virtual) with Dr Drue Second, is interested in transferring care to [facility]. [place], Zachary George, RN

## 2019-10-04 ENCOUNTER — Other Ambulatory Visit: Payer: Self-pay

## 2019-10-04 DIAGNOSIS — B20 Human immunodeficiency virus [HIV] disease: Secondary | ICD-10-CM

## 2019-10-04 MED ORDER — BIKTARVY 50-200-25 MG PO TABS: 1.0000 | ORAL_TABLET | Freq: Every day | ORAL | 0 refills | Status: DC

## 2019-10-27 ENCOUNTER — Ambulatory Visit (INDEPENDENT_AMBULATORY_CARE_PROVIDER_SITE_OTHER): Payer: BLUE CROSS/BLUE SHIELD | Admitting: Internal Medicine

## 2019-10-27 ENCOUNTER — Other Ambulatory Visit: Payer: Self-pay

## 2019-10-27 DIAGNOSIS — R635 Abnormal weight gain: Secondary | ICD-10-CM

## 2019-10-27 DIAGNOSIS — B20 Human immunodeficiency virus [HIV] disease: Secondary | ICD-10-CM

## 2019-10-27 DIAGNOSIS — F329 Major depressive disorder, single episode, unspecified: Secondary | ICD-10-CM | POA: Diagnosis not present

## 2019-10-27 DIAGNOSIS — F1590 Other stimulant use, unspecified, uncomplicated: Secondary | ICD-10-CM | POA: Diagnosis not present

## 2019-10-27 DIAGNOSIS — F32A Depression, unspecified: Secondary | ICD-10-CM

## 2019-10-27 NOTE — Progress Notes (Signed)
Virtual Visit via Telephone Note  I connected with Jimmy Davis on 10/27/19 at  9:15 AM EDT by telephone and verified that I am speaking with the correct person using two identifiers.  Location: Patient: at home Provider: at clinic   I discussed the limitations, risks, security and privacy concerns of performing an evaluation and management service by telephone and the availability of in person appointments. I also discussed with the patient that there may be a patient responsible charge related to this service. The patient expressed understanding and agreed to proceed.   History of Present Illness: - moved to [place], [place] so difficult - celebrated a year of sobriety this past Sunday - 2 months at [employer] - stopped recently taking anti deppresant due weight gain - 2nd dose- of moderna- last week - not sexually   Observations/Objective: No complaints  Assessment and Plan:  health maintenance = due for 3rd dose of hpv due next month. hiv disease = well controlled, will get labs at next visit. Will refill his medication Depression = stable Hx of meth use = sober x 1 year.  Hx of weight gain = now doing intentional weight loss, no longer on meds, exercising  Follow Up Instructions:    I discussed the assessment and treatment plan with the patient. The patient was provided an opportunity to ask questions and all were answered. The patient agreed with the plan and demonstrated an understanding of the instructions.   The patient was advised to call back or seek an in-person evaluation if the symptoms worsen or if the condition fails to improve as anticipated.  I provided 15 minutes of non-face-to-face time during this encounter.   Judyann Munson, MD

## 2019-10-29 ENCOUNTER — Other Ambulatory Visit: Payer: Self-pay | Admitting: *Deleted

## 2019-10-29 DIAGNOSIS — B20 Human immunodeficiency virus [HIV] disease: Secondary | ICD-10-CM

## 2019-10-29 MED ORDER — BIKTARVY 50-200-25 MG PO TABS: 1.0000 | ORAL_TABLET | Freq: Every day | ORAL | 4 refills | Status: DC

## 2020-01-27 ENCOUNTER — Ambulatory Visit: Payer: BLUE CROSS/BLUE SHIELD

## 2020-01-31 ENCOUNTER — Other Ambulatory Visit: Payer: Self-pay

## 2020-01-31 ENCOUNTER — Ambulatory Visit: Payer: BLUE CROSS/BLUE SHIELD

## 2020-02-03 ENCOUNTER — Encounter: Payer: Self-pay | Admitting: Internal Medicine

## 2020-03-24 ENCOUNTER — Other Ambulatory Visit: Payer: Self-pay | Admitting: Internal Medicine

## 2020-03-24 DIAGNOSIS — B20 Human immunodeficiency virus [HIV] disease: Secondary | ICD-10-CM

## 2020-04-23 ENCOUNTER — Other Ambulatory Visit: Payer: Self-pay | Admitting: Infectious Diseases

## 2020-04-23 DIAGNOSIS — B20 Human immunodeficiency virus [HIV] disease: Secondary | ICD-10-CM

## 2020-04-25 NOTE — Telephone Encounter (Signed)
I called the patient and explained to him why the DOS of 10/27/2019 was not covered. Patient was not covered by RW at that visit he missed his Jan re certification period. I explained to him the months of re certification and made him aware that those are important and if missed he could potential have a delay in getting his medication as well as billing issues.  Patient verbalized understanding .

## 2020-04-27 ENCOUNTER — Telehealth: Payer: Self-pay

## 2020-04-27 NOTE — Telephone Encounter (Signed)
Called patient to get follow up scheduled, said he would call back after work to get it scheduled

## 2020-05-05 ENCOUNTER — Other Ambulatory Visit: Payer: Self-pay

## 2020-05-05 DIAGNOSIS — B20 Human immunodeficiency virus [HIV] disease: Secondary | ICD-10-CM

## 2020-05-05 DIAGNOSIS — Z113 Encounter for screening for infections with a predominantly sexual mode of transmission: Secondary | ICD-10-CM

## 2020-05-05 DIAGNOSIS — Z79899 Other long term (current) drug therapy: Secondary | ICD-10-CM

## 2020-05-10 ENCOUNTER — Other Ambulatory Visit: Payer: Self-pay

## 2020-05-10 ENCOUNTER — Other Ambulatory Visit: Payer: BLUE CROSS/BLUE SHIELD

## 2020-05-10 DIAGNOSIS — Z113 Encounter for screening for infections with a predominantly sexual mode of transmission: Secondary | ICD-10-CM

## 2020-05-10 DIAGNOSIS — B20 Human immunodeficiency virus [HIV] disease: Secondary | ICD-10-CM

## 2020-05-10 DIAGNOSIS — Z79899 Other long term (current) drug therapy: Secondary | ICD-10-CM

## 2020-05-10 LAB — CBC WITH DIFFERENTIAL/PLATELET
Total Lymphocyte: 40.3 %
WBC: 5.9 10*3/uL (ref 3.8–10.8)

## 2020-05-11 LAB — T-HELPER CELL (CD4) - (RCID CLINIC ONLY)
CD4 % Helper T Cell: 27 % — ABNORMAL LOW (ref 33–65)
CD4 T Cell Abs: 650 /uL (ref 400–1790)

## 2020-05-12 LAB — URINE CYTOLOGY ANCILLARY ONLY
Chlamydia: NEGATIVE
Comment: NEGATIVE
Comment: NORMAL
Neisseria Gonorrhea: NEGATIVE

## 2020-05-13 LAB — COMPLETE METABOLIC PANEL WITH GFR
AG Ratio: 1.7 (calc) (ref 1.0–2.5)
ALT: 22 U/L (ref 9–46)
AST: 16 U/L (ref 10–40)
Albumin: 4.7 g/dL (ref 3.6–5.1)
Alkaline phosphatase (APISO): 75 U/L (ref 36–130)
BUN: 18 mg/dL (ref 7–25)
CO2: 29 mmol/L (ref 20–32)
Calcium: 9.7 mg/dL (ref 8.6–10.3)
Chloride: 103 mmol/L (ref 98–110)
Creat: 1.27 mg/dL (ref 0.60–1.35)
GFR, Est African American: 81 mL/min/{1.73_m2} (ref >=60)
GFR, Est Non African American: 70 mL/min/{1.73_m2} (ref >=60)
Globulin: 2.7 g/dL (calc) (ref 1.9–3.7)
Glucose, Bld: 71 mg/dL (ref 65–99)
Potassium: 4 mmol/L (ref 3.5–5.3)
Sodium: 140 mmol/L (ref 135–146)
Total Bilirubin: 0.4 mg/dL (ref 0.2–1.2)
Total Protein: 7.4 g/dL (ref 6.1–8.1)

## 2020-05-13 LAB — CBC WITH DIFFERENTIAL/PLATELET
Absolute Monocytes: 460 cells/uL (ref 200–950)
Basophils Absolute: 41 cells/uL (ref 0–200)
Basophils Relative: 0.7 %
Eosinophils Absolute: 118 cells/uL (ref 15–500)
Eosinophils Relative: 2 %
HCT: 43.5 % (ref 38.5–50.0)
Hemoglobin: 14.8 g/dL (ref 13.2–17.1)
Lymphs Abs: 2378 cells/uL (ref 850–3900)
MCH: 30.5 pg (ref 27.0–33.0)
MCHC: 34 g/dL (ref 32.0–36.0)
MCV: 89.5 fL (ref 80.0–100.0)
MPV: 9.6 fL (ref 7.5–12.5)
Monocytes Relative: 7.8 %
Neutro Abs: 2903 cells/uL (ref 1500–7800)
Neutrophils Relative %: 49.2 %
Platelets: 288 10*3/uL (ref 140–400)
RBC: 4.86 10*6/uL (ref 4.20–5.80)
RDW: 13 % (ref 11.0–15.0)

## 2020-05-13 LAB — HIV-1 RNA QUANT-NO REFLEX-BLD
HIV 1 RNA Quant: <20 Copies/mL
HIV-1 RNA Quant, Log: <1.3 Log cps/mL

## 2020-05-13 LAB — LIPID PANEL
Cholesterol: 182 mg/dL (ref <200)
HDL: 39 mg/dL — ABNORMAL LOW (ref >=40)
LDL Cholesterol (Calc): 119 mg/dL (calc) — ABNORMAL HIGH
Non-HDL Cholesterol (Calc): 143 mg/dL (calc) — ABNORMAL HIGH (ref <130)
Total CHOL/HDL Ratio: 4.7 (calc) (ref <5.0)
Triglycerides: 126 mg/dL (ref <150)

## 2020-05-13 LAB — RPR: RPR Ser Ql: NONREACTIVE

## 2020-05-24 ENCOUNTER — Other Ambulatory Visit: Payer: Self-pay

## 2020-05-24 ENCOUNTER — Encounter: Payer: Self-pay | Admitting: Internal Medicine

## 2020-05-24 ENCOUNTER — Ambulatory Visit: Payer: BLUE CROSS/BLUE SHIELD | Admitting: Internal Medicine

## 2020-05-24 ENCOUNTER — Other Ambulatory Visit: Payer: Self-pay | Admitting: Infectious Diseases

## 2020-05-24 VITALS — BP 120/81 | HR 88 | Temp 98.3°F | Wt 216.4 lb

## 2020-05-24 DIAGNOSIS — Z23 Encounter for immunization: Secondary | ICD-10-CM | POA: Diagnosis not present

## 2020-05-24 DIAGNOSIS — Z79899 Other long term (current) drug therapy: Secondary | ICD-10-CM | POA: Diagnosis not present

## 2020-05-24 DIAGNOSIS — E785 Hyperlipidemia, unspecified: Secondary | ICD-10-CM | POA: Diagnosis not present

## 2020-05-24 DIAGNOSIS — B20 Human immunodeficiency virus [HIV] disease: Secondary | ICD-10-CM

## 2020-05-24 MED ORDER — ROSUVASTATIN CALCIUM 5 MG PO TABS: 5.0000 mg | ORAL_TABLET | Freq: Every day | ORAL | 11 refills | Status: DC

## 2020-05-24 NOTE — Progress Notes (Signed)
RFV: follow up for hiv disease  Patient ID: Jimmy Davis, male   DOB: [birth year], 39 y.o.   MRN: [id number]  HPI Jimmy Davis is a 39yo M with well controlled hiv disease, takes medications regularly Working full time now mon-Friday -- at [employer]  Cm anonymous, has 2 mentees, and 1 sponsor. Now 19 months meth free.   Still sees psychiatrist - but no longer group therapy.  Weight loss intentional, majority of diet modification. (#40 Lb)  covid recovered  -- August 2021; no sequelae  uptodate with dentist = gum disease, improved.   Social hx: Not sexually active.   Outpatient Encounter Medications as of 05/24/2020  Medication Sig  . acetaminophen (TYLENOL) 500 MG tablet Take 1 tablet (500 mg total) by mouth every 6 (six) hours as needed for mild pain or moderate pain.  Marland Kitchen aspirin 325 MG EC tablet Take 325 mg by mouth every 4 (four) hours as needed for pain.  Marland Kitchen atomoxetine (STRATTERA) 40 MG capsule Take by mouth.  Marland Kitchen BIKTARVY 50-200-25 MG TABS tablet TAKE 1 TABLET BY MOUTH ONCE DAILY  . cetirizine (ZYRTEC) 10 MG tablet Take 1 tablet (10 mg total) by mouth daily.  Marland Kitchen venlafaxine XR (EFFEXOR-XR) 37.5 MG 24 hr capsule Take 37.5 mg daily for 1 week. If no issues after one week trial, increase to 75 mg  . FLUoxetine (PROZAC) 40 MG capsule Take 1 capsule (40 mg total) by mouth daily. (Patient not taking: Reported on 01/25/2019)  . L-Lysine 1000 MG TABS Take 1,000 mg by mouth daily.  . mirtazapine (REMERON) 30 MG tablet Take by mouth.  Marland Kitchen oxymetazoline (AFRIN) 0.05 % nasal spray Place 1 spray into both nostrils 2 (two) times daily as needed for congestion.   No facility-administered encounter medications on file as of 05/24/2020.     Patient Active Problem List   Diagnosis Date Noted  . Left leg pain 03/03/2018  . Obesity (BMI 30.0-34.9) 03/26/2017  . Obsessive-compulsive behavior 12/13/2016  . Anxiety 12/11/2016  . Methamphetamine use disorder, severe, dependence ([facility]) 12/09/2016  .  Major depressive disorder, recurrent episode, moderate ([facility]) 11/17/2015  . 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 Depression 10/03/2014  . Human immunodeficiency virus (HIV) disease ([facility]) 06/29/2013     Health Maintenance Due  Topic Date Due  . TETANUS/TDAP  Never done  . INFLUENZA VACCINE  02/25/20    Family hx: father died of cad in 67s; mother has HLD  Review of Systems Review of Systems  Constitutional: Negative for fever, chills, diaphoresis, activity change, appetite change, fatigue and unexpected weight change.  HENT: Negative for congestion, sore throat, rhinorrhea, sneezing, trouble swallowing and sinus pressure.  Eyes: Negative for photophobia and visual disturbance.  Respiratory: Negative for cough, chest tightness, shortness of breath, wheezing and stridor.  Cardiovascular: Negative for chest pain, palpitations and leg swelling.  Gastrointestinal: Negative for nausea, vomiting, abdominal pain, diarrhea, constipation, blood in stool, abdominal distention and anal bleeding.  Genitourinary: Negative for dysuria, hematuria, flank pain and difficulty urinating.  Musculoskeletal: Negative for myalgias, back pain, joint swelling, arthralgias and gait problem.  Skin: Negative for color change, pallor, rash and wound.  Neurological: Negative for dizziness, tremors, weakness and light-headedness.  Hematological: Negative for adenopathy. Does not bruise/bleed easily.  Psychiatric/Behavioral: Negative for behavioral problems, confusion, sleep disturbance, dysphoric mood, decreased concentration and agitation.    Physical Exam   BP 120/81   Pulse 88   Temp 98.3 F (36.8 C) (Oral)   Wt 216 lb 6.4 oz (98.2 kg)  BMI 31.05 kg/m   Physical Exam  Constitutional: He is oriented to person, place, and time. He appears well-developed and well-nourished. No distress.  HENT:  Mouth/Throat: Oropharynx is clear and moist. No oropharyngeal exudate.  Cardiovascular: Normal rate, regular  rhythm and normal heart sounds. Exam reveals no gallop and no friction rub.  No murmur heard.  Pulmonary/Chest: Effort normal and breath sounds normal. No respiratory distress. He has no wheezes.  Abdominal: Soft. Bowel sounds are normal. He exhibits no distension. There is no tenderness.  Lymphadenopathy:  He has no cervical adenopathy.  Neurological: He is alert and oriented to person, place, and time.  Skin: Skin is warm and dry. No rash noted. No erythema.  Psychiatric: He has a normal mood and affect. His behavior is normal.    Lab Results  Component Value Date   CD4TCELL 27 (L) 05/10/2020   Lab Results  Component Value Date   CD4TABS 650 05/10/2020   CD4TABS 944 05/13/2019   CD4TABS 750 08/12/2018   Lab Results  Component Value Date   HIV1RNAQUANT <20 05/10/2020   Lab Results  Component Value Date   HEPBSAB NEG 08/17/2013   Lab Results  Component Value Date   LABRPR NON-REACTIVE 05/10/2020    CBC Lab Results  Component Value Date   WBC 5.9 05/10/2020   RBC 4.86 05/10/2020   HGB 14.8 05/10/2020   HCT 43.5 05/10/2020   PLT 288 05/10/2020   MCV 89.5 05/10/2020   MCH 30.5 05/10/2020   MCHC 34.0 05/10/2020   RDW 13.0 05/10/2020   LYMPHSABS 2,378 05/10/2020   MONOABS 0.6 09/28/2017   EOSABS 118 05/10/2020    BMET Lab Results  Component Value Date   NA 140 05/10/2020   K 4.0 05/10/2020   CL 103 05/10/2020   CO2 29 05/10/2020   GLUCOSE 71 05/10/2020   BUN 18 05/10/2020   CREATININE 1.27 05/10/2020   CALCIUM 9.7 05/10/2020   GFRNONAA 70 05/10/2020   GFRAA 81 05/10/2020      Assessment and Plan  hiv disease = well controlled. Continue on biktarvy  Long term medication = cr is stable  HLD = start low cholesterol medication, will start crestor 5 mg  Health maintenance = will give flu and hpv #3 (off schedule); moderna booster.   Weight loss = intentional. Continue with diet modificaiton

## 2020-07-13 NOTE — Telephone Encounter (Signed)
Please see patient advice request and advise. Thank you!

## 2020-07-14 NOTE — Telephone Encounter (Signed)
He is on a very low dose of Crestor, so I am hesitant to say this is due to the statin. It more or so sounds like a work-related issue. That being said, it COULD be the statin causing some muscle plain. Again, unlikely in my opinion. The fatigue after work definitely has nothing to do with the statin or his HIV medication.

## 2020-09-04 ENCOUNTER — Other Ambulatory Visit: Payer: Self-pay

## 2020-09-04 DIAGNOSIS — B20 Human immunodeficiency virus [HIV] disease: Secondary | ICD-10-CM

## 2020-09-04 MED ORDER — BIKTARVY 50-200-25 MG PO TABS: 1.0000 | ORAL_TABLET | Freq: Every day | ORAL | 4 refills | Status: DC

## 2020-11-03 ENCOUNTER — Other Ambulatory Visit: Payer: Self-pay

## 2020-11-03 ENCOUNTER — Encounter: Payer: Self-pay | Admitting: Pharmacist

## 2020-11-03 ENCOUNTER — Ambulatory Visit: Payer: BLUE CROSS/BLUE SHIELD

## 2020-11-03 DIAGNOSIS — B20 Human immunodeficiency virus [HIV] disease: Secondary | ICD-10-CM

## 2020-11-03 NOTE — Progress Notes (Signed)
Patient provided with 2 weeks of Biktarvy due to insurance complications related to copay. Instructed to call us with any issues moving forward.

## 2020-11-03 NOTE — Telephone Encounter (Signed)
Yes of course! We can give him 2 weeks to help him out until his insurance issue gets straightened out.

## 2020-11-03 NOTE — Progress Notes (Signed)
Medication Samples have been provided to the patient.  Drug name: Biktarvy        Strength: 50/200/25 mg       Qty: 14 tablets (2 bottles)   LOT: CHSYVA   Exp.Date: 12/07/2022  Dosing instructions: Take one tablet by mouth once daily  The patient has been instructed regarding the correct time, dose, and frequency of taking this medication, including desired effects and most common side effects.   Iren Whipp L. Joziyah Roblero, PharmD, BCIDP, AAHIVP, CPP Clinical Pharmacist Practitioner Infectious Diseases Clinical Pharmacist [facility] 06/19/2020, 10:07 AM  

## 2020-11-15 ENCOUNTER — Telehealth: Payer: Self-pay

## 2020-11-15 ENCOUNTER — Other Ambulatory Visit (HOSPITAL_COMMUNITY): Payer: Self-pay

## 2020-11-15 NOTE — Telephone Encounter (Signed)
Medication Samples have been provided to the patient.  Drug name: Biktarvy        Strength: 50/200/25 mg       Qty: 21   LOT: CHYSA   Exp.Date: 12/2021  Dosing instructions: Take one tablet by mouth once daily  Clearance Coots, CPhT Specialty Pharmacy Patient [facility] for Infectious Disease Phone: [phone] Fax:  [phone]

## 2020-11-15 NOTE — Telephone Encounter (Signed)
Patient called office today requesting additional samples of medication. States that insurance coverage is still pending. Is not able to get refills from pharmacy at this time.  Jimmy Davis, Artist will have samples ready for patient. Patient will pickup meds later this week. Juanita Laster, RMA

## 2020-11-21 ENCOUNTER — Other Ambulatory Visit: Payer: BLUE CROSS/BLUE SHIELD

## 2020-12-06 ENCOUNTER — Other Ambulatory Visit: Payer: Self-pay

## 2020-12-06 DIAGNOSIS — B20 Human immunodeficiency virus [HIV] disease: Secondary | ICD-10-CM

## 2020-12-07 ENCOUNTER — Telehealth: Payer: Self-pay

## 2020-12-07 ENCOUNTER — Other Ambulatory Visit: Payer: Self-pay

## 2020-12-07 DIAGNOSIS — B20 Human immunodeficiency virus [HIV] disease: Secondary | ICD-10-CM

## 2020-12-07 NOTE — Telephone Encounter (Signed)
Patient reports that his insurance is no longer active. Staff reached out to financial coordinator who will send notification for patient UMAP assistance. [facility] staff to provided addition samples for approximately one week to bridge patient. Valarie Cones

## 2020-12-07 NOTE — Telephone Encounter (Signed)
Done! Samples provided.  Medication Samples have been provided to the patient.  Drug name: Biktarvy        Strength: 50/200/25 mg       Qty: 4 bottles (28 tablets)   LOT: CHSYVA   Exp.Date: 6/24  Dosing instructions: Take one tablet by mouth once daily  The patient has been instructed regarding the correct time, dose, and frequency of taking this medication, including desired effects and most common side effects.   Tremond Shimabukuro L. Jannette Fogo, PharmD, BCIDP, AAHIVP, CPP Clinical Pharmacist Practitioner Infectious Diseases Clinical Pharmacist [facility] 06/19/2020, 10:07 AM

## 2020-12-08 LAB — T-HELPER CELL (CD4) - (RCID CLINIC ONLY)
CD4 % Helper T Cell: 30 % — ABNORMAL LOW (ref 33–65)
CD4 T Cell Abs: 833 /uL (ref 400–1790)

## 2020-12-09 LAB — COMPLETE METABOLIC PANEL WITH GFR
AG Ratio: 1.9 (calc) (ref 1.0–2.5)
ALT: 32 U/L (ref 9–46)
AST: 17 U/L (ref 10–40)
Albumin: 5 g/dL (ref 3.6–5.1)
Alkaline phosphatase (APISO): 77 U/L (ref 36–130)
BUN: 19 mg/dL (ref 7–25)
CO2: 28 mmol/L (ref 20–32)
Calcium: 10 mg/dL (ref 8.6–10.3)
Chloride: 104 mmol/L (ref 98–110)
Creat: 1.1 mg/dL (ref 0.60–1.35)
GFR, Est African American: 96 mL/min/{1.73_m2} (ref >=60)
GFR, Est Non African American: 83 mL/min/{1.73_m2} (ref >=60)
Globulin: 2.7 g/dL (calc) (ref 1.9–3.7)
Glucose, Bld: 80 mg/dL (ref 65–99)
Potassium: 4.1 mmol/L (ref 3.5–5.3)
Sodium: 140 mmol/L (ref 135–146)
Total Bilirubin: 0.4 mg/dL (ref 0.2–1.2)
Total Protein: 7.7 g/dL (ref 6.1–8.1)

## 2020-12-09 LAB — CBC WITH DIFFERENTIAL/PLATELET
Absolute Monocytes: 533 cells/uL (ref 200–950)
Basophils Absolute: 43 cells/uL (ref 0–200)
Basophils Relative: 0.6 %
Eosinophils Absolute: 220 cells/uL (ref 15–500)
Eosinophils Relative: 3.1 %
HCT: 43.2 % (ref 38.5–50.0)
Hemoglobin: 14.9 g/dL (ref 13.2–17.1)
Lymphs Abs: 2719 cells/uL (ref 850–3900)
MCH: 30.5 pg (ref 27.0–33.0)
MCHC: 34.5 g/dL (ref 32.0–36.0)
MCV: 88.5 fL (ref 80.0–100.0)
MPV: 9.1 fL (ref 7.5–12.5)
Monocytes Relative: 7.5 %
Neutro Abs: 3586 cells/uL (ref 1500–7800)
Neutrophils Relative %: 50.5 %
Platelets: 269 10*3/uL (ref 140–400)
RBC: 4.88 10*6/uL (ref 4.20–5.80)
RDW: 12.4 % (ref 11.0–15.0)
Total Lymphocyte: 38.3 %
WBC: 7.1 10*3/uL (ref 3.8–10.8)

## 2020-12-09 LAB — HIV-1 RNA QUANT-NO REFLEX-BLD
HIV 1 RNA Quant: NOT DETECTED Copies/mL
HIV-1 RNA Quant, Log: NOT DETECTED Log cps/mL

## 2020-12-12 ENCOUNTER — Encounter: Payer: BLUE CROSS/BLUE SHIELD | Admitting: Internal Medicine

## 2020-12-12 ENCOUNTER — Other Ambulatory Visit: Payer: BLUE CROSS/BLUE SHIELD

## 2020-12-13 ENCOUNTER — Other Ambulatory Visit: Payer: BLUE CROSS/BLUE SHIELD

## 2020-12-15 ENCOUNTER — Other Ambulatory Visit: Payer: Self-pay

## 2020-12-15 DIAGNOSIS — B20 Human immunodeficiency virus [HIV] disease: Secondary | ICD-10-CM

## 2020-12-15 MED ORDER — BIKTARVY 50-200-25 MG PO TABS: 1.0000 | ORAL_TABLET | Freq: Every day | ORAL | 0 refills | Status: DC

## 2020-12-26 ENCOUNTER — Encounter: Payer: BLUE CROSS/BLUE SHIELD | Admitting: Internal Medicine

## 2020-12-28 ENCOUNTER — Encounter: Payer: Self-pay | Admitting: Internal Medicine

## 2020-12-28 ENCOUNTER — Other Ambulatory Visit: Payer: Self-pay

## 2020-12-28 ENCOUNTER — Ambulatory Visit (INDEPENDENT_AMBULATORY_CARE_PROVIDER_SITE_OTHER): Payer: Self-pay | Admitting: Internal Medicine

## 2020-12-28 DIAGNOSIS — Z79899 Other long term (current) drug therapy: Secondary | ICD-10-CM

## 2020-12-28 DIAGNOSIS — B20 Human immunodeficiency virus [HIV] disease: Secondary | ICD-10-CM

## 2020-12-28 NOTE — Progress Notes (Signed)
Virtual Visit via Telephone Note  I connected with Jimmy Davis on 12/28/20 at  2:45 PM EDT by telephone and verified that I am speaking with the correct person using two identifiers.  Location: Patient: at home Provider: in clinic   I discussed the limitations, risks, security and privacy concerns of performing an evaluation and management service by telephone and the availability of in person appointments. I also discussed with the patient that there may be a patient responsible charge related to this service. The patient expressed understanding and agreed to proceed.   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 Interested in becoming a counselor - maintains sobriety/not sexually active  Had covid august 2021, but no subsequent close re-expousre (has had 3 doses) Not sexually active Observations/Objective:  CD 833/VL<20;  Fluent speech  Assessment and Plan:  Reviewed labs, continue with good adherence Cr is stable - in terms on long term medication use Contemplating to switch to cabenuva Gave update on meningococcal and monkeypox outbreak  Follow Up Instructions: Continue current medication regimen; see back in 4 months. Will do flu and covid booster at that time   I discussed the assessment and treatment plan with the patient. The patient was provided an opportunity to ask questions and all were answered. The patient agreed with the plan and demonstrated an understanding of the instructions.   The patient was advised to call back or seek an in-person evaluation if the symptoms worsen or if the condition fails to improve as anticipated.  I provided 10 minutes of non-face-to-face time during this encounter.   Judyann Munson, MD

## 2021-03-25 DIAGNOSIS — E785 Hyperlipidemia, unspecified: Secondary | ICD-10-CM

## 2021-03-26 MED ORDER — ROSUVASTATIN CALCIUM 5 MG PO TABS: 5.0000 mg | ORAL_TABLET | Freq: Every day | ORAL | 1 refills | Status: DC

## 2021-03-26 NOTE — Addendum Note (Signed)
Addended by: Andree Coss on: 03/26/2021 11:40 AM   Modules accepted: Orders

## 2021-03-26 NOTE — Telephone Encounter (Signed)
Patient will call to have his medication transferred from [facility] to [facility] in [place]. RN updated his pharmacy preferences as well. Andree Coss, RN

## 2021-04-05 ENCOUNTER — Other Ambulatory Visit: Payer: Self-pay

## 2021-04-05 DIAGNOSIS — Z113 Encounter for screening for infections with a predominantly sexual mode of transmission: Secondary | ICD-10-CM

## 2021-04-05 DIAGNOSIS — B20 Human immunodeficiency virus [HIV] disease: Secondary | ICD-10-CM

## 2021-04-05 DIAGNOSIS — Z79899 Other long term (current) drug therapy: Secondary | ICD-10-CM

## 2021-04-06 ENCOUNTER — Other Ambulatory Visit: Payer: BC Managed Care – PPO

## 2021-04-06 ENCOUNTER — Other Ambulatory Visit: Payer: Self-pay | Admitting: Internal Medicine

## 2021-04-06 DIAGNOSIS — B20 Human immunodeficiency virus [HIV] disease: Secondary | ICD-10-CM

## 2021-04-10 ENCOUNTER — Other Ambulatory Visit: Payer: Medicaid Other

## 2021-04-11 ENCOUNTER — Other Ambulatory Visit: Payer: Self-pay | Admitting: Internal Medicine

## 2021-04-11 DIAGNOSIS — B20 Human immunodeficiency virus [HIV] disease: Secondary | ICD-10-CM

## 2021-05-01 ENCOUNTER — Other Ambulatory Visit: Payer: BC Managed Care – PPO

## 2021-05-17 NOTE — Telephone Encounter (Signed)
Labs printed from [facility] and placed in Dr. Feliz Beam box for review.    Sandie Ano, RN

## 2021-05-20 ENCOUNTER — Other Ambulatory Visit: Payer: Self-pay | Admitting: Internal Medicine

## 2021-05-20 DIAGNOSIS — E785 Hyperlipidemia, unspecified: Secondary | ICD-10-CM

## 2021-05-21 NOTE — Telephone Encounter (Signed)
Appt 11/15

## 2021-05-22 ENCOUNTER — Telehealth: Payer: Self-pay | Admitting: Internal Medicine

## 2021-05-22 ENCOUNTER — Other Ambulatory Visit: Payer: Self-pay

## 2021-05-25 NOTE — Telephone Encounter (Signed)
Appt 1/26

## 2021-06-01 ENCOUNTER — Other Ambulatory Visit: Payer: Self-pay | Admitting: Internal Medicine

## 2021-06-01 DIAGNOSIS — B20 Human immunodeficiency virus [HIV] disease: Secondary | ICD-10-CM

## 2021-06-12 ENCOUNTER — Encounter: Payer: Self-pay | Admitting: Internal Medicine

## 2021-06-12 ENCOUNTER — Ambulatory Visit (INDEPENDENT_AMBULATORY_CARE_PROVIDER_SITE_OTHER): Payer: No Typology Code available for payment source | Admitting: Internal Medicine

## 2021-06-12 ENCOUNTER — Ambulatory Visit: Payer: Self-pay

## 2021-06-12 ENCOUNTER — Other Ambulatory Visit: Payer: Self-pay

## 2021-06-12 DIAGNOSIS — B20 Human immunodeficiency virus [HIV] disease: Secondary | ICD-10-CM

## 2021-06-12 DIAGNOSIS — Z79899 Other long term (current) drug therapy: Secondary | ICD-10-CM | POA: Diagnosis not present

## 2021-06-12 DIAGNOSIS — E785 Hyperlipidemia, unspecified: Secondary | ICD-10-CM

## 2021-06-12 MED ORDER — BIKTARVY 50-200-25 MG PO TABS: 1.0000 | ORAL_TABLET | Freq: Every day | ORAL | 11 refills | Status: DC

## 2021-06-12 MED ORDER — ROSUVASTATIN CALCIUM 5 MG PO TABS: 5.0000 mg | ORAL_TABLET | Freq: Every day | ORAL | 11 refills | Status: AC

## 2021-06-12 NOTE — Progress Notes (Signed)
Virtual Visit via Telephone Note  I connected with Lavonne Cass on 06/12/21 at 10:15 AM EST by telephone and verified that I am speaking with the correct person using two identifiers.  Location: Patient: at home Provider: at clinic   I discussed the limitations, risks, security and privacy concerns of performing an evaluation and management service by telephone and the availability of in person appointments. I also discussed with the patient that there may be a patient responsible charge related to this service. The patient expressed understanding and agreed to proceed.   History of Present Illness: 40yo M with history of well controlled HIV disease, has hx of meth use/addiction, he reports that he is still sober 2 yr 7 mo. 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 Busy, but hours, 10 -12 hrs shifts.was diagnosed with sleep apnea, just started using cpap.  Also went to urgent care for diffuse rash. Had prednisone taper- unclear what allergic dermatitis he may have had Did labs through [facility] last month which we reviewed. Still doing well- undetectable VL   Observations/Objective: Fluent speech  Assessment and Plan: Reviewed his labs with him Continue with daily biktarvy Kidney function WNL while on bitkarvy-  Follow Up Instructions: Rtc 6 months Well controlled HIV Refilled his biktarvy- may consider changing to new regimen Refilled rosuvastatin for HLD   I discussed the assessment and treatment plan with the patient. The patient was provided an opportunity to ask questions and all were answered. The patient agreed with the plan and demonstrated an understanding of the instructions.   The patient was advised to call back or seek an in-person evaluation if the symptoms worsen or if the condition fails to improve as anticipated.  I provided 10 minutes of non-face-to-face time during this encounter.   Judyann Munson, MD

## 2021-06-15 ENCOUNTER — Encounter: Payer: Self-pay | Admitting: Internal Medicine

## 2021-06-29 ENCOUNTER — Other Ambulatory Visit: Payer: Self-pay | Admitting: Internal Medicine

## 2021-06-29 DIAGNOSIS — B20 Human immunodeficiency virus [HIV] disease: Secondary | ICD-10-CM

## 2021-07-06 ENCOUNTER — Encounter: Payer: Self-pay | Admitting: Internal Medicine

## 2021-07-06 MED ORDER — NIRMATRELVIR/RITONAVIR (PAXLOVID)TABLET
3.0000 | ORAL_TABLET | Freq: Two times a day (BID) | ORAL | 0 refills | Status: AC
Start: 2021-07-06 — End: 2021-07-11

## 2021-07-06 NOTE — Telephone Encounter (Signed)
Received notification of Covid positive test on 07/06/21 with symptoms of nasal congestion and fever starting on 07/04/21. Chart reviewed with GFR of 89. Appears to be candidate for full dose Paxlovid. Advised to continue with symptom management as needed with OTC medications.   Marcos Eke, NP 07/06/2021 10:29 AM

## 2021-08-03 ENCOUNTER — Other Ambulatory Visit: Payer: Self-pay

## 2021-08-03 ENCOUNTER — Telehealth: Payer: Self-pay

## 2021-08-03 DIAGNOSIS — B20 Human immunodeficiency virus [HIV] disease: Secondary | ICD-10-CM

## 2021-08-03 MED ORDER — BIKTARVY 50-200-25 MG PO TABS: 1.0000 | ORAL_TABLET | Freq: Every day | ORAL | 5 refills | Status: DC

## 2021-08-03 NOTE — Progress Notes (Signed)
Pharmacy requested new Rx due to insurance change. Verbal order given to Boneta Lucks, Pharmacist. Juanita Laster, RMA

## 2021-08-03 NOTE — Telephone Encounter (Signed)
Patient called wanting to talk to a nurse, said he is unable to get his Susanne Borders because pharmacy needs to get the doctor's authorization. He provided phone number [phone] to reach pharmacist. His best contact number is [phone]

## 2021-08-03 NOTE — Telephone Encounter (Signed)
Called pharmacy regarding patient concern. Pharmacy will call patient to set up mail order. Juanita Laster, RMA

## 2021-08-06 ENCOUNTER — Encounter: Payer: Self-pay | Admitting: Internal Medicine

## 2021-08-08 ENCOUNTER — Encounter: Payer: Self-pay | Admitting: Internal Medicine

## 2021-08-09 ENCOUNTER — Other Ambulatory Visit (HOSPITAL_COMMUNITY): Payer: Self-pay

## 2021-08-09 ENCOUNTER — Other Ambulatory Visit: Payer: Self-pay | Admitting: Pharmacist

## 2021-08-09 DIAGNOSIS — B20 Human immunodeficiency virus [HIV] disease: Secondary | ICD-10-CM

## 2021-08-09 MED ORDER — BIKTARVY 50-200-25 MG PO TABS: 1.0000 | ORAL_TABLET | Freq: Every day | ORAL | 0 refills | Status: AC

## 2021-08-09 NOTE — Telephone Encounter (Signed)
Thanks Donna!

## 2021-08-09 NOTE — Telephone Encounter (Signed)
Thank you Megan.

## 2021-08-09 NOTE — Telephone Encounter (Signed)
I sent an application to [employer] to do a benefit investigation, because the [employer] is not on his formulary. I will give patient an update once it is completed.

## 2021-08-09 NOTE — Progress Notes (Signed)
Medication Samples have been provided to the patient. ° °Drug name: Biktarvy        °Strength: 50/200/25 mg       °Qty: 7 tablets (1 bottle)   °LOT: [id number]   °Exp.Date: 04/08/23 ° °Dosing instructions: Take one tablet by mouth once daily ° °The patient has been instructed regarding the correct time, dose, and frequency of taking this medication, including desired effects and most common side effects.  ° °Casimira Sutphin L. Braden Cimo, PharmD, BCIDP, AAHIVP, CPP °Clinical Pharmacist Practitioner °Infectious Diseases Clinical Pharmacist °[facility] °06/19/2020, 10:07 AM ° °

## 2021-08-14 ENCOUNTER — Other Ambulatory Visit (HOSPITAL_COMMUNITY): Payer: Self-pay

## 2021-09-03 ENCOUNTER — Encounter: Payer: Self-pay | Admitting: Internal Medicine

## 2021-09-03 ENCOUNTER — Other Ambulatory Visit (HOSPITAL_COMMUNITY): Payer: Self-pay

## 2021-09-03 ENCOUNTER — Other Ambulatory Visit: Payer: Self-pay

## 2021-09-03 DIAGNOSIS — B20 Human immunodeficiency virus [HIV] disease: Secondary | ICD-10-CM

## 2021-09-03 MED ORDER — BIKTARVY 50-200-25 MG PO TABS: 1.0000 | ORAL_TABLET | Freq: Every day | ORAL | 5 refills | Status: DC

## 2021-10-01 ENCOUNTER — Other Ambulatory Visit: Payer: Self-pay | Admitting: Internal Medicine

## 2021-10-01 DIAGNOSIS — E785 Hyperlipidemia, unspecified: Secondary | ICD-10-CM

## 2021-11-13 ENCOUNTER — Encounter: Payer: Self-pay | Admitting: Internal Medicine

## 2021-11-26 ENCOUNTER — Other Ambulatory Visit: Payer: No Typology Code available for payment source

## 2021-12-11 ENCOUNTER — Ambulatory Visit: Payer: No Typology Code available for payment source | Admitting: Internal Medicine

## 2021-12-27 ENCOUNTER — Other Ambulatory Visit: Payer: No Typology Code available for payment source

## 2021-12-27 ENCOUNTER — Other Ambulatory Visit: Payer: PRIVATE HEALTH INSURANCE

## 2021-12-31 ENCOUNTER — Other Ambulatory Visit (HOSPITAL_COMMUNITY)
Admission: RE | Admit: 2021-12-31 | Discharge: 2021-12-31 | Disposition: A | Discharge status: Home / Self Care | Payer: PRIVATE HEALTH INSURANCE | Source: Ambulatory Visit | Attending: Internal Medicine | Admitting: Internal Medicine

## 2021-12-31 ENCOUNTER — Other Ambulatory Visit: Payer: Self-pay

## 2021-12-31 ENCOUNTER — Other Ambulatory Visit: Payer: PRIVATE HEALTH INSURANCE

## 2021-12-31 DIAGNOSIS — Z113 Encounter for screening for infections with a predominantly sexual mode of transmission: Secondary | ICD-10-CM

## 2021-12-31 DIAGNOSIS — B20 Human immunodeficiency virus [HIV] disease: Secondary | ICD-10-CM | POA: Diagnosis present

## 2021-12-31 DIAGNOSIS — Z79899 Other long term (current) drug therapy: Secondary | ICD-10-CM | POA: Insufficient documentation

## 2022-01-01 LAB — T-HELPER CELL (CD4) - (RCID CLINIC ONLY)
CD4 % Helper T Cell: 29 % — ABNORMAL LOW (ref 33–65)
CD4 T Cell Abs: 605 /uL (ref 400–1790)

## 2022-01-01 LAB — URINE CYTOLOGY ANCILLARY ONLY
Chlamydia: NEGATIVE
Comment: NEGATIVE
Comment: NORMAL
Neisseria Gonorrhea: NEGATIVE

## 2022-01-02 LAB — CBC WITH DIFFERENTIAL/PLATELET
Absolute Monocytes: 421 cells/uL (ref 200–950)
Basophils Absolute: 28 cells/uL (ref 0–200)
Basophils Relative: 0.4 %
Eosinophils Absolute: 83 cells/uL (ref 15–500)
Eosinophils Relative: 1.2 %
HCT: 46 % (ref 38.5–50.0)
Hemoglobin: 15.1 g/dL (ref 13.2–17.1)
Lymphs Abs: 2215 cells/uL (ref 850–3900)
MCH: 30.1 pg (ref 27.0–33.0)
MCHC: 32.8 g/dL (ref 32.0–36.0)
MCV: 91.6 fL (ref 80.0–100.0)
MPV: 9.7 fL (ref 7.5–12.5)
Monocytes Relative: 6.1 %
Neutro Abs: 4154 cells/uL (ref 1500–7800)
Neutrophils Relative %: 60.2 %
Platelets: 279 10*3/uL (ref 140–400)
RBC: 5.02 10*6/uL (ref 4.20–5.80)
RDW: 12.8 % (ref 11.0–15.0)
Total Lymphocyte: 32.1 %
WBC: 6.9 10*3/uL (ref 3.8–10.8)

## 2022-01-02 LAB — LIPID PANEL
Cholesterol: 109 mg/dL (ref <200)
HDL: 45 mg/dL (ref >=40)
LDL Cholesterol (Calc): 48 mg/dL (calc)
Non-HDL Cholesterol (Calc): 64 mg/dL (calc) (ref <130)
Total CHOL/HDL Ratio: 2.4 (calc) (ref <5.0)
Triglycerides: 77 mg/dL (ref <150)

## 2022-01-02 LAB — COMPLETE METABOLIC PANEL WITH GFR
AG Ratio: 2.1 (calc) (ref 1.0–2.5)
ALT: 30 U/L (ref 9–46)
AST: 19 U/L (ref 10–40)
Albumin: 5 g/dL (ref 3.6–5.1)
Alkaline phosphatase (APISO): 91 U/L (ref 36–130)
BUN: 13 mg/dL (ref 7–25)
CO2: 26 mmol/L (ref 20–32)
Calcium: 10.1 mg/dL (ref 8.6–10.3)
Chloride: 102 mmol/L (ref 98–110)
Creat: 1.09 mg/dL (ref 0.60–1.29)
Globulin: 2.4 g/dL (calc) (ref 1.9–3.7)
Glucose, Bld: 82 mg/dL (ref 65–99)
Potassium: 3.9 mmol/L (ref 3.5–5.3)
Sodium: 138 mmol/L (ref 135–146)
Total Bilirubin: 0.5 mg/dL (ref 0.2–1.2)
Total Protein: 7.4 g/dL (ref 6.1–8.1)
eGFR: 87 mL/min/{1.73_m2} (ref >=60)

## 2022-01-02 LAB — HIV-1 RNA QUANT-NO REFLEX-BLD
HIV 1 RNA Quant: NOT DETECTED Copies/mL
HIV-1 RNA Quant, Log: NOT DETECTED Log cps/mL

## 2022-01-02 LAB — RPR: RPR Ser Ql: NONREACTIVE

## 2022-01-10 ENCOUNTER — Other Ambulatory Visit: Payer: Self-pay

## 2022-01-10 ENCOUNTER — Telehealth: Payer: Self-pay | Admitting: Pharmacist

## 2022-01-10 ENCOUNTER — Encounter: Payer: Self-pay | Admitting: Internal Medicine

## 2022-01-10 ENCOUNTER — Ambulatory Visit (INDEPENDENT_AMBULATORY_CARE_PROVIDER_SITE_OTHER): Payer: PRIVATE HEALTH INSURANCE | Admitting: Internal Medicine

## 2022-01-10 ENCOUNTER — Ambulatory Visit: Payer: PRIVATE HEALTH INSURANCE

## 2022-01-10 VITALS — BP 111/60 | HR 88 | Temp 98.2°F | Wt 199.0 lb

## 2022-01-10 DIAGNOSIS — Z23 Encounter for immunization: Secondary | ICD-10-CM | POA: Diagnosis not present

## 2022-01-10 DIAGNOSIS — B20 Human immunodeficiency virus [HIV] disease: Secondary | ICD-10-CM | POA: Diagnosis not present

## 2022-01-10 DIAGNOSIS — Z79899 Other long term (current) drug therapy: Secondary | ICD-10-CM | POA: Diagnosis not present

## 2022-01-10 NOTE — Progress Notes (Signed)
RFV: follow up for hiv disease  Patient ID: Jimmy Davis, male   DOB: [birth year], 41 y.o.   MRN: [id number]  HPI 41yo M with well controlled hiv disease, on biktarvy, CD 4 count of 605/VL <20, in June 2023, on biktarvy. Doing well with adherence.  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  Enjoys his workplace. No health concerns  Sochx: sobriety from meth use now-- 3 yrs 2 mo.  Outpatient Encounter Medications as of 01/10/2022  Medication Sig   atomoxetine (STRATTERA) 40 MG capsule Take by mouth.   bictegravir-emtricitabine-tenofovir AF (BIKTARVY) 50-200-25 MG TABS tablet Take 1 tablet by mouth daily.   rosuvastatin (CRESTOR) 5 MG tablet Take 1 tablet (5 mg total) by mouth daily.   tadalafil (CIALIS) 10 MG tablet Take 10 mg by mouth daily as needed.   venlafaxine XR (EFFEXOR-XR) 37.5 MG 24 hr capsule Take 37.5 mg daily for 1 week. If no issues after one week trial, increase to 75 mg   hydrOXYzine (ATARAX) 25 MG tablet Take 25 mg by mouth 3 (three) times daily.   triamcinolone cream (KENALOG) 0.1 % SMARTSIG:1 Application Topical 2-3 Times Daily   No facility-administered encounter medications on file as of 01/10/2022.     Patient Active Problem List   Diagnosis Date Noted   Left leg pain 03/03/2018   Obesity (BMI 30.0-34.9) 03/26/2017   Obsessive-compulsive behavior 12/13/2016   Anxiety 12/11/2016   Methamphetamine use disorder, severe, dependence (HCC) 12/09/2016   Major depressive disorder, recurrent episode, moderate (HCC) 11/17/2015   Stimulant use disorder 11/17/2015   Depression 10/03/2014   Human immunodeficiency virus (HIV) disease (HCC) 06/29/2013     Health Maintenance Due  Topic Date Due   TETANUS/TDAP  Never done   COVID-19 Vaccine (3 - Moderna risk series) 11/17/2019     Review of Systems  Constitutional: Negative for fever, chills, diaphoresis, activity change, appetite change, fatigue and unexpected weight change.  HENT: Negative  for congestion, sore throat, rhinorrhea, sneezing, trouble swallowing and sinus pressure.  Eyes: Negative for photophobia and visual disturbance.  Respiratory: Negative for cough, chest tightness, shortness of breath, wheezing and stridor.  Cardiovascular: Negative for chest pain, palpitations and leg swelling.  Gastrointestinal: Negative for nausea, vomiting, abdominal pain, diarrhea, constipation, blood in stool, abdominal distention and anal bleeding.  Genitourinary: Negative for dysuria, hematuria, flank pain and difficulty urinating.  Musculoskeletal: Negative for myalgias, back pain, joint swelling, arthralgias and gait problem.  Skin: Negative for color change, pallor, rash and wound.  Neurological: Negative for dizziness, tremors, weakness and light-headedness.  Hematological: Negative for adenopathy. Does not bruise/bleed easily.  Psychiatric/Behavioral: Negative for behavioral problems, confusion, sleep disturbance, dysphoric mood, decreased concentration and agitation.   Physical Exam   BP 111/60   Pulse 88   Temp 98.2 F (36.8 C) (Oral)   Wt 199 lb (90.3 kg)   SpO2 99%   BMI 28.55 kg/m   Physical Exam  Constitutional: He is oriented to person, place, and time. He appears well-developed and well-nourished. No distress.  HENT:  Mouth/Throat: Oropharynx is clear and moist. No oropharyngeal exudate.  Cardiovascular: Normal rate, regular rhythm and normal heart sounds. Exam reveals no gallop and no friction rub.  No murmur heard.  Pulmonary/Chest: Effort normal and breath sounds normal. No respiratory distress. He has no wheezes.  Lymphadenopathy:  He has no cervical adenopathy.  Neurological: He is alert and oriented to person, place, and time.  Skin: Skin is warm and dry. No  rash noted. No erythema.  Psychiatric: He has a normal mood and affect. His behavior is normal.   Lab Results  Component Value Date   CD4TCELL 29 (L) 12/31/2021   Lab Results  Component Value Date    CD4TABS 605 12/31/2021   CD4TABS 833 12/07/2020   CD4TABS 650 05/10/2020   Lab Results  Component Value Date   HIV1RNAQUANT Not Detected 12/31/2021   Lab Results  Component Value Date   HEPBSAB NEG 08/17/2013   Lab Results  Component Value Date   LABRPR NON-REACTIVE 12/31/2021    CBC Lab Results  Component Value Date   WBC 6.9 12/31/2021   RBC 5.02 12/31/2021   HGB 15.1 12/31/2021   HCT 46.0 12/31/2021   PLT 279 12/31/2021   MCV 91.6 12/31/2021   MCH 30.1 12/31/2021   MCHC 32.8 12/31/2021   RDW 12.8 12/31/2021   LYMPHSABS 2,215 12/31/2021   MONOABS 0.6 09/28/2017   EOSABS 83 12/31/2021    BMET Lab Results  Component Value Date   NA 138 12/31/2021   K 3.9 12/31/2021   CL 102 12/31/2021   CO2 26 12/31/2021   GLUCOSE 82 12/31/2021   BUN 13 12/31/2021   CREATININE 1.09 12/31/2021   CALCIUM 10.1 12/31/2021   GFRNONAA 83 12/07/2020   GFRAA 96 12/07/2020      Assessment and Plan Hiv disease= will continue on biktarvy but he is also-- interested in injectable-cabenuva. We will refer to pharmacy  Long term medication management = cr is stable  Health maintenance = recommend to do flu and covid booster in the Fall; will give smallpox vaccine -1st dose today

## 2022-01-10 NOTE — Telephone Encounter (Signed)
Jimmy Davis, can you look into Cabenuva coverage for Brittney? Thanks!  Counseled that [place] is two separate intramuscular injections in the gluteal muscle on each side for each visit. Explained that the second injection is 30 days after the initial injection then every 2 months thereafter. Discussed the rare but significant chance of developing resistance despite compliance. Explained that showing up to injection appointments is very important and warned that if 2 appointments are missed, it will be reassessed by their provider whether they are a good candidate for injection therapy. Counseled on possible side effects associated with the injections such as injection site pain, which is usually mild to moderate in nature, injection site nodules, and injection site reactions. Asked to call the clinic or send me a mychart message if they experience any issues, such as fatigue, nausea, headache, rash, or dizziness. Advised that they can take ibuprofen or tylenol for injection site pain if needed.   Margarite Gouge, PharmD, CPP Clinical Pharmacist Practitioner Infectious Diseases Clinical Pharmacist [facility]

## 2022-01-10 NOTE — Progress Notes (Signed)
    [facility]  Name:  Yosiel Thieme    MRN: [id number] DOB: 1981/02/07   01/10/2022  Mr. Filsaime was observed post JYNNEOS immunization for 15 minutes without incident. He was provided with Vaccine Information Sheet and instruction to access the V-Safe system.   Mr. Harrower was instructed to call 911 with any severe reactions post vaccine: Difficulty breathing  Swelling of face and throat  A fast heartbeat  A bad rash all over body  Dizziness and weakness    Juanita Laster, RMA

## 2022-01-11 ENCOUNTER — Other Ambulatory Visit (HOSPITAL_COMMUNITY): Payer: Self-pay

## 2022-01-11 NOTE — Telephone Encounter (Signed)
I can start a PA for Cabenuva for him.

## 2022-01-15 ENCOUNTER — Telehealth: Payer: Self-pay

## 2022-01-15 ENCOUNTER — Other Ambulatory Visit (HOSPITAL_COMMUNITY): Payer: Self-pay

## 2022-01-15 NOTE — Telephone Encounter (Signed)
[facility] Patient Advocate Encounter   Received notification from [employer] that prior authorization for CABENUVA is required.   PA submitted on 01/15/22 Key [id number] Status is pending    [facility] will continue to follow.   Clearance Coots, CPhT Specialty Pharmacy Patient [facility] for Infectious Disease Phone: [phone] Fax:  [phone]

## 2022-01-17 ENCOUNTER — Telehealth: Payer: Self-pay

## 2022-01-17 ENCOUNTER — Other Ambulatory Visit: Payer: Self-pay | Admitting: Pharmacist

## 2022-01-17 ENCOUNTER — Encounter: Payer: Self-pay | Admitting: Internal Medicine

## 2022-01-17 ENCOUNTER — Other Ambulatory Visit (HOSPITAL_COMMUNITY): Payer: Self-pay

## 2022-01-17 ENCOUNTER — Encounter: Payer: Self-pay | Admitting: Pharmacist

## 2022-01-17 DIAGNOSIS — B20 Human immunodeficiency virus [HIV] disease: Secondary | ICD-10-CM

## 2022-01-17 MED ORDER — CABOTEGRAVIR & RILPIVIRINE ER 600 & 900 MG/3ML IM SUER
1.0000 | INTRAMUSCULAR | 1 refills | Status: DC
  Filled 2022-01-17 (×2): qty 6, 30d supply, fill #0

## 2022-01-17 MED ORDER — CABOTEGRAVIR & RILPIVIRINE ER 600 & 900 MG/3ML IM SUER
1.0000 | INTRAMUSCULAR | 5 refills | Status: DC
  Filled 2022-01-17: qty 6, 60d supply, fill #0

## 2022-01-17 NOTE — Telephone Encounter (Signed)
FYI - patient changed his mind about the injections. Sorry you had to look into it [place].Marland KitchenMarland KitchenMarland KitchenThanks All! - Marchelle Folks

## 2022-01-17 NOTE — Telephone Encounter (Signed)
Thanks Lupita Leash! LVM with patient to call back for scheduling. - Marchelle Folks

## 2022-01-17 NOTE — Telephone Encounter (Signed)
[facility] Patient Advocate Encounter  Prior Authorization for Jimmy Davis has been approved.    PA# [id number] Effective dates: 01/15/22 through 01/16/23  Patients co-pay is $0.00.   Prescription can be filled at [facility] will continue to follow.  Clearance Coots, CPhT Specialty Pharmacy Patient [facility] for Infectious Disease Phone: [phone] Fax:  [phone]

## 2022-02-04 ENCOUNTER — Encounter: Payer: Self-pay | Admitting: Internal Medicine

## 2022-02-07 ENCOUNTER — Ambulatory Visit: Payer: PRIVATE HEALTH INSURANCE

## 2022-02-11 ENCOUNTER — Telehealth: Payer: Self-pay

## 2022-02-11 ENCOUNTER — Ambulatory Visit: Payer: PRIVATE HEALTH INSURANCE

## 2022-02-11 NOTE — Telephone Encounter (Signed)
Called patient to reschedule today's nurse visit, no answer. Left HIPAA compliant voicemail requesting callback.   Sandie Ano, RN

## 2022-02-15 ENCOUNTER — Encounter: Payer: Self-pay | Admitting: Internal Medicine

## 2022-02-15 ENCOUNTER — Other Ambulatory Visit: Payer: Self-pay | Admitting: Internal Medicine

## 2022-02-15 DIAGNOSIS — B20 Human immunodeficiency virus [HIV] disease: Secondary | ICD-10-CM

## 2022-02-18 ENCOUNTER — Encounter: Payer: Self-pay | Admitting: Internal Medicine

## 2022-03-04 ENCOUNTER — Ambulatory Visit (INDEPENDENT_AMBULATORY_CARE_PROVIDER_SITE_OTHER): Payer: PRIVATE HEALTH INSURANCE

## 2022-03-04 ENCOUNTER — Other Ambulatory Visit: Payer: Self-pay

## 2022-03-04 DIAGNOSIS — Z23 Encounter for immunization: Secondary | ICD-10-CM

## 2022-03-12 ENCOUNTER — Other Ambulatory Visit: Payer: Self-pay | Admitting: Internal Medicine

## 2022-03-12 DIAGNOSIS — B20 Human immunodeficiency virus [HIV] disease: Secondary | ICD-10-CM

## 2022-03-13 ENCOUNTER — Encounter: Payer: Self-pay | Admitting: Internal Medicine

## 2022-03-13 ENCOUNTER — Other Ambulatory Visit: Payer: Self-pay

## 2022-04-05 ENCOUNTER — Encounter: Payer: Self-pay | Admitting: Internal Medicine

## 2022-05-30 ENCOUNTER — Other Ambulatory Visit: Payer: Self-pay | Admitting: Internal Medicine

## 2022-05-30 DIAGNOSIS — B20 Human immunodeficiency virus [HIV] disease: Secondary | ICD-10-CM

## 2022-06-01 ENCOUNTER — Encounter: Payer: Self-pay | Admitting: Internal Medicine

## 2022-06-03 ENCOUNTER — Other Ambulatory Visit: Payer: Self-pay

## 2022-07-09 ENCOUNTER — Ambulatory Visit: Payer: PRIVATE HEALTH INSURANCE | Admitting: Internal Medicine

## 2022-07-11 ENCOUNTER — Ambulatory Visit: Payer: PRIVATE HEALTH INSURANCE | Admitting: Internal Medicine

## 2022-07-15 ENCOUNTER — Ambulatory Visit (INDEPENDENT_AMBULATORY_CARE_PROVIDER_SITE_OTHER): Payer: PRIVATE HEALTH INSURANCE | Admitting: Internal Medicine

## 2022-07-15 ENCOUNTER — Encounter: Payer: Self-pay | Admitting: Internal Medicine

## 2022-07-15 ENCOUNTER — Ambulatory Visit (INDEPENDENT_AMBULATORY_CARE_PROVIDER_SITE_OTHER): Payer: PRIVATE HEALTH INSURANCE

## 2022-07-15 ENCOUNTER — Other Ambulatory Visit: Payer: Self-pay

## 2022-07-15 VITALS — BP 120/83 | HR 78 | Temp 97.9°F | Ht 69.0 in | Wt 190.0 lb

## 2022-07-15 DIAGNOSIS — B20 Human immunodeficiency virus [HIV] disease: Secondary | ICD-10-CM

## 2022-07-15 DIAGNOSIS — Z23 Encounter for immunization: Secondary | ICD-10-CM | POA: Diagnosis not present

## 2022-07-15 DIAGNOSIS — E785 Hyperlipidemia, unspecified: Secondary | ICD-10-CM

## 2022-07-15 DIAGNOSIS — Z789 Other specified health status: Secondary | ICD-10-CM | POA: Diagnosis not present

## 2022-07-15 DIAGNOSIS — Z79899 Other long term (current) drug therapy: Secondary | ICD-10-CM | POA: Diagnosis not present

## 2022-07-15 MED ORDER — BIKTARVY 50-200-25 MG PO TABS: 1.0000 | ORAL_TABLET | Freq: Every day | ORAL | 11 refills | Status: AC

## 2022-07-15 NOTE — Progress Notes (Signed)
RFV: follow up for hiv disease  Patient ID: Jimmy Davis, male   DOB: [birth year], 42 y.o.   MRN: [id number]  HPI Jimmy Davis is a 42yo M with hiv disease, CD 4 count of 605/VL<20 on biktarvy  No issues with medications. Spent time with family stayed health.  Had hernia surgery in November. Took 4 weeks to heal.   Has been on wegovy since march. Has had over 30 lb weight loss. Has been doing yoga since last summer.   Continues to work, looking for a different position. This spring will be 4 yrs of sobriety  Health insurance - needs to transfer to [facility] for better covreage  Outpatient Encounter Medications as of 07/15/2022  Medication Sig   atomoxetine (STRATTERA) 40 MG capsule Take by mouth.   bictegravir-emtricitabine-tenofovir AF (BIKTARVY) 50-200-25 MG TABS tablet TAKE 1 TABLET BY MOUTH DAILY   rosuvastatin (CRESTOR) 5 MG tablet Take 1 tablet (5 mg total) by mouth daily.   tadalafil (CIALIS) 10 MG tablet Take 10 mg by mouth daily as needed.   venlafaxine XR (EFFEXOR-XR) 37.5 MG 24 hr capsule Take 37.5 mg daily for 1 week. If no issues after one week trial, increase to 75 mg   WEGOVY 2.4 MG/0.75ML SOAJ Inject into the skin.   hydrOXYzine (ATARAX) 25 MG tablet Take 25 mg by mouth 3 (three) times daily.   triamcinolone cream (KENALOG) 0.1 % SMARTSIG:1 Application Topical 2-3 Times Daily   No facility-administered encounter medications on file as of 07/15/2022.     Patient Active Problem List   Diagnosis Date Noted   Left leg pain 03/03/2018   Obesity (BMI 30.0-34.9) 03/26/2017   Obsessive-compulsive behavior 12/13/2016   Anxiety 12/11/2016   Methamphetamine use disorder, severe, dependence ([place]) 12/09/2016   Major depressive disorder, recurrent episode, moderate ([place]) 11/17/2015   Stimulant use disorder 11/17/2015   Depression 10/03/2014   Human immunodeficiency virus (HIV) disease ([place]) 06/29/2013     Health Maintenance Due  Topic Date Due   DTaP/Tdap/Td (1  - Tdap) Never done   COVID-19 Vaccine (3 - Moderna risk series) 11/17/2019     Review of Systems Review of Systems  Constitutional: Negative for fever, chills, diaphoresis, activity change, appetite change, fatigue and unexpected weight change.  HENT: Negative for congestion, sore throat, rhinorrhea, sneezing, trouble swallowing and sinus pressure.  Eyes: Negative for photophobia and visual disturbance.  Respiratory: Negative for cough, chest tightness, shortness of breath, wheezing and stridor.  Cardiovascular: Negative for chest pain, palpitations and leg swelling.  Gastrointestinal: Negative for nausea, vomiting, abdominal pain, diarrhea, constipation, blood in stool, abdominal distention and anal bleeding.  Genitourinary: Negative for dysuria, hematuria, flank pain and difficulty urinating.  Musculoskeletal: Negative for myalgias, back pain, joint swelling, arthralgias and gait problem.  Skin: Negative for color change, pallor, rash and wound.  Neurological: Negative for dizziness, tremors, weakness and light-headedness.  Hematological: Negative for adenopathy. Does not bruise/bleed easily.  Psychiatric/Behavioral: Negative for behavioral problems, confusion, sleep disturbance, dysphoric mood, decreased concentration and agitation.   Physical Exam   BP 120/83   Pulse 78   Temp 97.9 F (36.6 C) (Temporal)   Ht '5\' 9"'$  (1.753 m)   Wt 190 lb (86.2 kg)   SpO2 100%   BMI 28.06 kg/m   Physical Exam  Constitutional: He is oriented to person, place, and time. He appears well-developed and well-nourished. No distress.  HENT:  Mouth/Throat: Oropharynx is clear and moist. No oropharyngeal exudate.  Cardiovascular: Normal rate, regular rhythm and normal  heart sounds. Exam reveals no gallop and no friction rub.  No murmur heard.  Pulmonary/Chest: Effort normal and breath sounds normal. No respiratory distress. He has no wheezes.  Abdominal: Soft. Bowel sounds are normal. He exhibits no  distension. There is no tenderness.  Lymphadenopathy:  He has no cervical adenopathy.  Neurological: He is alert and oriented to person, place, and time.  Skin: Skin is warm and dry. No rash noted. No erythema.  Psychiatric: He has a normal mood and affect. His behavior is normal.   Lab Results  Component Value Date   CD4TCELL 29 (L) 12/31/2021   Lab Results  Component Value Date   CD4TABS 605 12/31/2021   CD4TABS 833 12/07/2020   CD4TABS 650 05/10/2020   Lab Results  Component Value Date   HIV1RNAQUANT Not Detected 12/31/2021   Lab Results  Component Value Date   HEPBSAB NEG 08/17/2013   Lab Results  Component Value Date   LABRPR NON-REACTIVE 12/31/2021    CBC Lab Results  Component Value Date   WBC 6.9 12/31/2021   RBC 5.02 12/31/2021   HGB 15.1 12/31/2021   HCT 46.0 12/31/2021   PLT 279 12/31/2021   MCV 91.6 12/31/2021   MCH 30.1 12/31/2021   MCHC 32.8 12/31/2021   RDW 12.8 12/31/2021   LYMPHSABS 2,215 12/31/2021   MONOABS 0.6 09/28/2017   EOSABS 83 12/31/2021    BMET Lab Results  Component Value Date   NA 138 12/31/2021   K 3.9 12/31/2021   CL 102 12/31/2021   CO2 26 12/31/2021   GLUCOSE 82 12/31/2021   BUN 13 12/31/2021   CREATININE 1.09 12/31/2021   CALCIUM 10.1 12/31/2021   GFRNONAA 83 12/07/2020   GFRAA 96 12/07/2020      Assessment and Plan  Hiv disease = lab work to see still undetectable. Plan to give refills  Long term medication management = will check cr  Health promotion = given he has heart disease in family, and also discussed reprieve trial results, will plan to start low dose rosuvastatin, check lipids  Health maintenance = will give covid vaccine booster Will need anal pap at next visit

## 2022-07-17 ENCOUNTER — Encounter: Payer: Self-pay | Admitting: Internal Medicine

## 2022-07-17 LAB — LIPID PANEL
Cholesterol: 113 mg/dL (ref <200)
HDL: 49 mg/dL (ref >=40)
LDL Cholesterol (Calc): 50 mg/dL (calc)
Non-HDL Cholesterol (Calc): 64 mg/dL (calc) (ref <130)
Total CHOL/HDL Ratio: 2.3 (calc) (ref <5.0)
Triglycerides: 63 mg/dL (ref <150)

## 2022-07-17 LAB — CBC WITH DIFFERENTIAL/PLATELET
Absolute Monocytes: 416 cells/uL (ref 200–950)
Basophils Absolute: 39 cells/uL (ref 0–200)
Basophils Relative: 0.6 %
Eosinophils Absolute: 260 cells/uL (ref 15–500)
Eosinophils Relative: 4 %
HCT: 41.5 % (ref 38.5–50.0)
Hemoglobin: 14.5 g/dL (ref 13.2–17.1)
Lymphs Abs: 1944 cells/uL (ref 850–3900)
MCH: 31.2 pg (ref 27.0–33.0)
MCHC: 34.9 g/dL (ref 32.0–36.0)
MCV: 89.2 fL (ref 80.0–100.0)
MPV: 9.6 fL (ref 7.5–12.5)
Monocytes Relative: 6.4 %
Neutro Abs: 3842 cells/uL (ref 1500–7800)
Neutrophils Relative %: 59.1 %
Platelets: 244 10*3/uL (ref 140–400)
RBC: 4.65 10*6/uL (ref 4.20–5.80)
RDW: 12.6 % (ref 11.0–15.0)
Total Lymphocyte: 29.9 %
WBC: 6.5 10*3/uL (ref 3.8–10.8)

## 2022-07-17 LAB — COMPLETE METABOLIC PANEL WITH GFR
AG Ratio: 1.9 (calc) (ref 1.0–2.5)
ALT: 27 U/L (ref 9–46)
AST: 19 U/L (ref 10–40)
Albumin: 5 g/dL (ref 3.6–5.1)
Alkaline phosphatase (APISO): 70 U/L (ref 36–130)
BUN: 15 mg/dL (ref 7–25)
CO2: 26 mmol/L (ref 20–32)
Calcium: 9.9 mg/dL (ref 8.6–10.3)
Chloride: 100 mmol/L (ref 98–110)
Creat: 1.1 mg/dL (ref 0.60–1.29)
Globulin: 2.7 g/dL (calc) (ref 1.9–3.7)
Glucose, Bld: 78 mg/dL (ref 65–99)
Potassium: 4 mmol/L (ref 3.5–5.3)
Sodium: 137 mmol/L (ref 135–146)
Total Bilirubin: 0.7 mg/dL (ref 0.2–1.2)
Total Protein: 7.7 g/dL (ref 6.1–8.1)
eGFR: 85 mL/min/{1.73_m2} (ref >=60)

## 2022-07-17 LAB — HIV-1 RNA QUANT-NO REFLEX-BLD
HIV 1 RNA Quant: 20 Copies/mL — ABNORMAL HIGH
HIV-1 RNA Quant, Log: 1.3 Log cps/mL — ABNORMAL HIGH

## 2022-07-17 LAB — HELPER T-LYMPH-CD4 (ARMC ONLY)
% CD 4 Pos. Lymph.: 19.7 % — ABNORMAL LOW (ref 30.8–58.5)
Absolute CD 4 Helper: 394 /uL (ref 359–1519)
Basophils Absolute: 0.1 10*3/uL (ref 0.0–0.2)
Basos: 1 %
EOS (ABSOLUTE): 0.2 10*3/uL (ref 0.0–0.4)
Eos: 4 %
Hematocrit: 44 % (ref 37.5–51.0)
Hemoglobin: 14.5 g/dL (ref 13.0–17.7)
Immature Grans (Abs): 0 10*3/uL (ref 0.0–0.1)
Immature Granulocytes: 0 %
Lymphocytes Absolute: 2 10*3/uL (ref 0.7–3.1)
Lymphs: 31 %
MCH: 30.6 pg (ref 26.6–33.0)
MCHC: 33 g/dL (ref 31.5–35.7)
MCV: 93 fL (ref 79–97)
Monocytes Absolute: 0.4 10*3/uL (ref 0.1–0.9)
Monocytes: 6 %
Neutrophils Absolute: 3.8 10*3/uL (ref 1.4–7.0)
Neutrophils: 58 %
Platelets: 244 10*3/uL (ref 150–450)
RBC: 4.74 x10E6/uL (ref 4.14–5.80)
RDW: 12.6 % (ref 11.6–15.4)
WBC: 6.5 10*3/uL (ref 3.4–10.8)

## 2022-07-17 LAB — RPR: RPR Ser Ql: NONREACTIVE

## 2022-07-21 ENCOUNTER — Encounter: Payer: Self-pay | Admitting: Internal Medicine

## 2022-07-23 ENCOUNTER — Encounter: Payer: Self-pay | Admitting: Internal Medicine

## 2022-07-23 DIAGNOSIS — B20 Human immunodeficiency virus [HIV] disease: Secondary | ICD-10-CM

## 2022-12-19 ENCOUNTER — Other Ambulatory Visit (HOSPITAL_COMMUNITY): Payer: Self-pay

## 2022-12-31 ENCOUNTER — Encounter: Payer: Self-pay | Admitting: Internal Medicine

## 2023-01-14 ENCOUNTER — Ambulatory Visit: Payer: PRIVATE HEALTH INSURANCE | Admitting: Internal Medicine

## 2023-11-28 ENCOUNTER — Other Ambulatory Visit: Payer: Self-pay | Admitting: Medical Genetics

## 2023-12-08 ENCOUNTER — Other Ambulatory Visit (HOSPITAL_COMMUNITY)
Admission: RE | Admit: 2023-12-08 | Discharge: 2023-12-08 | Disposition: A | Discharge status: Home / Self Care | Payer: PRIVATE HEALTH INSURANCE | Source: Ambulatory Visit | Attending: Medical Genetics | Admitting: Medical Genetics

## 2023-12-08 ENCOUNTER — Other Ambulatory Visit (HOSPITAL_COMMUNITY): Payer: PRIVATE HEALTH INSURANCE

## 2023-12-16 LAB — GENECONNECT MOLECULAR SCREEN: Genetic Analysis Overall Interpretation: NEGATIVE
